# Patient Record
Sex: Female | Born: 1970 | Race: White | Hispanic: No | Marital: Single | Smoking: Current every day smoker
Health system: Southern US, Community
[De-identification: ages and names within clinical notes are randomized; demographics above are authoritative.]

## PROBLEM LIST (undated history)

## (undated) DIAGNOSIS — I10 Essential (primary) hypertension: Secondary | ICD-10-CM

## (undated) DIAGNOSIS — IMO0001 Reserved for inherently not codable concepts without codable children: Secondary | ICD-10-CM

## (undated) DIAGNOSIS — J069 Acute upper respiratory infection, unspecified: Secondary | ICD-10-CM

## (undated) DIAGNOSIS — M1990 Unspecified osteoarthritis, unspecified site: Secondary | ICD-10-CM

## (undated) DIAGNOSIS — E039 Hypothyroidism, unspecified: Secondary | ICD-10-CM

## (undated) DIAGNOSIS — L02219 Cutaneous abscess of trunk, unspecified: Principal | ICD-10-CM

## (undated) DIAGNOSIS — H269 Unspecified cataract: Secondary | ICD-10-CM

## (undated) DIAGNOSIS — E1142 Type 2 diabetes mellitus with diabetic polyneuropathy: Secondary | ICD-10-CM

## (undated) DIAGNOSIS — E119 Type 2 diabetes mellitus without complications: Secondary | ICD-10-CM

## (undated) DIAGNOSIS — L03319 Cellulitis of trunk, unspecified: Principal | ICD-10-CM

## (undated) DIAGNOSIS — K219 Gastro-esophageal reflux disease without esophagitis: Secondary | ICD-10-CM

## (undated) DIAGNOSIS — M79605 Pain in left leg: Secondary | ICD-10-CM

## (undated) DIAGNOSIS — L309 Dermatitis, unspecified: Secondary | ICD-10-CM

## (undated) DIAGNOSIS — D649 Anemia, unspecified: Secondary | ICD-10-CM

## (undated) DIAGNOSIS — M79604 Pain in right leg: Secondary | ICD-10-CM

## (undated) DIAGNOSIS — E785 Hyperlipidemia, unspecified: Secondary | ICD-10-CM

## (undated) HISTORY — DX: Pain in right leg: M79.604

## (undated) HISTORY — DX: Acute upper respiratory infection, unspecified: J06.9

## (undated) HISTORY — DX: Hypothyroidism, unspecified: E03.9

## (undated) HISTORY — PX: EYE SURGERY: SHX253

## (undated) HISTORY — DX: Type 2 diabetes mellitus with diabetic polyneuropathy: E11.42

## (undated) HISTORY — DX: Type 2 diabetes mellitus without complications: E11.9

## (undated) HISTORY — PX: WISDOM TOOTH EXTRACTION: SHX21

## (undated) HISTORY — DX: Reserved for inherently not codable concepts without codable children: IMO0001

## (undated) HISTORY — DX: Pain in left leg: M79.605

## (undated) HISTORY — DX: Dermatitis, unspecified: L30.9

## (undated) HISTORY — DX: Hyperlipidemia, unspecified: E78.5

## (undated) HISTORY — DX: Gastro-esophageal reflux disease without esophagitis: K21.9

## (undated) HISTORY — DX: Unspecified cataract: H26.9

## (undated) HISTORY — PX: CHOLECYSTECTOMY: SHX55

## (undated) HISTORY — DX: Essential (primary) hypertension: I10

---

## 2008-10-25 ENCOUNTER — Emergency Department (HOSPITAL_BASED_OUTPATIENT_CLINIC_OR_DEPARTMENT_OTHER): Admission: EM | Admit: 2008-10-25 | Discharge: 2008-10-25 | Payer: Self-pay | Admitting: Emergency Medicine

## 2009-01-10 ENCOUNTER — Emergency Department (HOSPITAL_BASED_OUTPATIENT_CLINIC_OR_DEPARTMENT_OTHER): Admission: EM | Admit: 2009-01-10 | Discharge: 2009-01-10 | Payer: Self-pay | Admitting: Emergency Medicine

## 2009-06-07 ENCOUNTER — Ambulatory Visit: Payer: Self-pay | Admitting: Diagnostic Radiology

## 2009-06-07 ENCOUNTER — Emergency Department (HOSPITAL_BASED_OUTPATIENT_CLINIC_OR_DEPARTMENT_OTHER): Admission: EM | Admit: 2009-06-07 | Discharge: 2009-06-07 | Payer: Self-pay | Admitting: Emergency Medicine

## 2009-10-08 DIAGNOSIS — M79605 Pain in left leg: Secondary | ICD-10-CM

## 2009-10-08 DIAGNOSIS — M79604 Pain in right leg: Secondary | ICD-10-CM

## 2009-10-08 HISTORY — DX: Pain in right leg: M79.605

## 2009-10-08 HISTORY — DX: Pain in right leg: M79.604

## 2010-06-24 LAB — CBC
MCHC: 33.5 g/dL (ref 30.0–36.0)
RBC: 4.91 MIL/uL (ref 3.87–5.11)
RDW: 13.1 % (ref 11.5–15.5)

## 2010-06-24 LAB — URINALYSIS, ROUTINE W REFLEX MICROSCOPIC
Bilirubin Urine: NEGATIVE
Glucose, UA: 100 mg/dL — AB
Nitrite: NEGATIVE
Urobilinogen, UA: 0.2 mg/dL (ref 0.0–1.0)

## 2010-06-24 LAB — BASIC METABOLIC PANEL
BUN: 8 mg/dL (ref 6–23)
Chloride: 104 mEq/L (ref 96–112)
GFR calc Af Amer: >60 mL/min (ref >60)
Glucose, Bld: 172 mg/dL — ABNORMAL HIGH (ref 70–99)
Sodium: 140 mEq/L (ref 135–145)

## 2010-06-24 LAB — DIFFERENTIAL
Eosinophils Absolute: 0 10*3/uL (ref 0.0–0.7)
Eosinophils Relative: 0 % (ref 0–5)
Lymphocytes Relative: 6 % — ABNORMAL LOW (ref 12–46)
Monocytes Absolute: 0.3 10*3/uL (ref 0.1–1.0)
Monocytes Relative: 5 % (ref 3–12)
Neutro Abs: 6 10*3/uL (ref 1.7–7.7)
Neutrophils Relative %: 88 % — ABNORMAL HIGH (ref 43–77)

## 2010-06-24 LAB — GLUCOSE, CAPILLARY: Glucose-Capillary: 172 mg/dL — ABNORMAL HIGH (ref 70–99)

## 2010-06-24 LAB — PREGNANCY, URINE: Preg Test, Ur: NEGATIVE

## 2010-06-27 LAB — LIPASE, BLOOD: Lipase: 81 U/L (ref 23–300)

## 2010-06-27 LAB — URINALYSIS, ROUTINE W REFLEX MICROSCOPIC
Hgb urine dipstick: NEGATIVE
Ketones, ur: NEGATIVE mg/dL
Nitrite: NEGATIVE
pH: 6 (ref 5.0–8.0)

## 2010-06-27 LAB — COMPREHENSIVE METABOLIC PANEL
ALT: 25 U/L (ref 0–35)
Alkaline Phosphatase: 81 U/L (ref 39–117)
BUN: 6 mg/dL (ref 6–23)
Calcium: 10.1 mg/dL (ref 8.4–10.5)
Chloride: 103 mEq/L (ref 96–112)
Creatinine, Ser: 0.8 mg/dL (ref 0.4–1.2)
GFR calc non Af Amer: >60 mL/min (ref >60)
Total Bilirubin: 0.3 mg/dL (ref 0.3–1.2)
Total Protein: 6.4 g/dL (ref 6.0–8.3)

## 2010-06-27 LAB — DIFFERENTIAL
Basophils Absolute: 0.1 10*3/uL (ref 0.0–0.1)
Basophils Relative: 1 % (ref 0–1)
Lymphs Abs: 2.3 10*3/uL (ref 0.7–4.0)
Neutro Abs: 5 10*3/uL (ref 1.7–7.7)

## 2010-06-27 LAB — CBC
HCT: 42 % (ref 36.0–46.0)
MCHC: 33.3 g/dL (ref 30.0–36.0)
MCV: 85.9 fL (ref 78.0–100.0)
Platelets: 267 10*3/uL (ref 150–400)
RBC: 4.89 MIL/uL (ref 3.87–5.11)
RDW: 13.5 % (ref 11.5–15.5)
WBC: 8.4 10*3/uL (ref 4.0–10.5)

## 2010-07-01 DIAGNOSIS — J069 Acute upper respiratory infection, unspecified: Secondary | ICD-10-CM

## 2010-07-01 HISTORY — DX: Acute upper respiratory infection, unspecified: J06.9

## 2010-08-12 ENCOUNTER — Encounter: Payer: Self-pay | Admitting: Nurse Practitioner

## 2011-08-10 ENCOUNTER — Encounter (HOSPITAL_BASED_OUTPATIENT_CLINIC_OR_DEPARTMENT_OTHER): Payer: Self-pay | Admitting: *Deleted

## 2011-08-10 ENCOUNTER — Emergency Department (HOSPITAL_BASED_OUTPATIENT_CLINIC_OR_DEPARTMENT_OTHER)
Admission: EM | Admit: 2011-08-10 | Discharge: 2011-08-10 | Disposition: A | Discharge status: Home / Self Care | Payer: Medicaid Other | Attending: Emergency Medicine | Admitting: Emergency Medicine

## 2011-08-10 DIAGNOSIS — E119 Type 2 diabetes mellitus without complications: Secondary | ICD-10-CM | POA: Insufficient documentation

## 2011-08-10 DIAGNOSIS — E785 Hyperlipidemia, unspecified: Secondary | ICD-10-CM | POA: Insufficient documentation

## 2011-08-10 DIAGNOSIS — K219 Gastro-esophageal reflux disease without esophagitis: Secondary | ICD-10-CM | POA: Insufficient documentation

## 2011-08-10 DIAGNOSIS — K529 Noninfective gastroenteritis and colitis, unspecified: Secondary | ICD-10-CM

## 2011-08-10 DIAGNOSIS — K5289 Other specified noninfective gastroenteritis and colitis: Secondary | ICD-10-CM | POA: Insufficient documentation

## 2011-08-10 DIAGNOSIS — Z79899 Other long term (current) drug therapy: Secondary | ICD-10-CM | POA: Insufficient documentation

## 2011-08-10 DIAGNOSIS — I10 Essential (primary) hypertension: Secondary | ICD-10-CM | POA: Insufficient documentation

## 2011-08-10 LAB — COMPREHENSIVE METABOLIC PANEL
AST: 26 U/L (ref 0–37)
Albumin: 3.6 g/dL (ref 3.5–5.2)
Alkaline Phosphatase: 54 U/L (ref 39–117)
Chloride: 100 mEq/L (ref 96–112)
Creatinine, Ser: 1.3 mg/dL — ABNORMAL HIGH (ref 0.50–1.10)
Potassium: 4.3 mEq/L (ref 3.5–5.1)
Sodium: 136 mEq/L (ref 135–145)
Total Bilirubin: 0.5 mg/dL (ref 0.3–1.2)

## 2011-08-10 LAB — CBC
HCT: 39.7 % (ref 36.0–46.0)
MCV: 75.3 fL — ABNORMAL LOW (ref 78.0–100.0)
RDW: 16.2 % — ABNORMAL HIGH (ref 11.5–15.5)
WBC: 12.6 10*3/uL — ABNORMAL HIGH (ref 4.0–10.5)

## 2011-08-10 LAB — DIFFERENTIAL
Basophils Absolute: 0 10*3/uL (ref 0.0–0.1)
Eosinophils Relative: 0 % (ref 0–5)
Lymphocytes Relative: 5 % — ABNORMAL LOW (ref 12–46)
Monocytes Absolute: 0.9 10*3/uL (ref 0.1–1.0)

## 2011-08-10 LAB — URINALYSIS, ROUTINE W REFLEX MICROSCOPIC
Glucose, UA: NEGATIVE mg/dL
Hgb urine dipstick: NEGATIVE
Ketones, ur: 15 mg/dL — AB
pH: 5 (ref 5.0–8.0)

## 2011-08-10 LAB — URINE MICROSCOPIC-ADD ON

## 2011-08-10 MED ORDER — SODIUM CHLORIDE 0.9 % IV BOLUS (SEPSIS)
1000.0000 mL | Freq: Once | INTRAVENOUS | Status: AC
  Administered 2011-08-10: 1000 mL via INTRAVENOUS

## 2011-08-10 MED ORDER — ONDANSETRON HCL 4 MG/2ML IJ SOLN
4.0000 mg | Freq: Once | INTRAMUSCULAR | Status: AC
  Administered 2011-08-10: 4 mg via INTRAVENOUS
  Filled 2011-08-10: qty 2

## 2011-08-10 MED ORDER — IBUPROFEN 400 MG PO TABS
400.0000 mg | ORAL_TABLET | Freq: Once | ORAL | Status: AC
  Administered 2011-08-10: 400 mg via ORAL
  Filled 2011-08-10: qty 1

## 2011-08-10 MED ORDER — PANTOPRAZOLE SODIUM 40 MG IV SOLR
40.0000 mg | Freq: Once | INTRAVENOUS | Status: AC
  Administered 2011-08-10: 40 mg via INTRAVENOUS
  Filled 2011-08-10: qty 40

## 2011-08-10 NOTE — ED Notes (Signed)
Pt reports N/V/D and epigastric abdominal pain since 5pm last night. Low grade fever at home.

## 2011-08-10 NOTE — ED Notes (Signed)
Pt sipping on water. States HA is slowly decreasing

## 2011-08-10 NOTE — ED Provider Notes (Signed)
History     CSN: [id number]  Arrival date & time 08/10/11  0346   First MD Initiated Contact with Patient 08/10/11 0400      Chief Complaint  Patient presents with  . Nausea, vomiting and diarrhea     (Consider location/radiation/quality/duration/timing/severity/associated sxs/prior treatment) HPI This is a 41 year old white female with about an a 11 hour history of nausea, vomiting and diarrhea. It began abruptly and was initially accompanied by a headache. It is been accompanied by a vaguely characterized epigastric pain and intermittent lightheadedness. She states she feels like she is dehydrated. She has a history of intermittent vomiting but states that this is more severe and she suspects it to be food poisoning or a virus. Vomiting is exacerbated by eating or drinking although she was able to drink some Gatorade this morning without vomiting. She had a subjective fever at home.  Past Medical History  Diagnosis Date  . DM (diabetes mellitus)   . Eczema   . Diabetic peripheral neuropathy   . URI (upper respiratory infection) 07/01/10  . NIDDM (non-insulin dependent diabetes mellitus)   . Hypertension   . GERD (gastroesophageal reflux disease)   . Hyperlipidemia   . Leg pain, bilateral 10/08/09    Past Surgical History  Procedure Date  . Cholecystectomy     No family history on file.  History  Substance Use Topics  . Smoking status: Never Smoker   . Smokeless tobacco: Not on file  . Alcohol Use: No    OB History    Grav Para Term Preterm Abortions TAB SAB Ect Mult Living                  Review of Systems  All other systems reviewed and are negative.    Allergies  Niaspan and Penicillins  Home Medications   Current Outpatient Rx  Name Route Sig Dispense Refill  . ASPIRIN 81 MG PO CHEW Oral Chew 81 mg by mouth daily.      . ATORVASTATIN CALCIUM 40 MG PO TABS Oral Take 40 mg by mouth daily.      Marland Kitchen PRODIGY AUTOCODE BLOOD GLUCOSE DEVI Does not apply by  Does not apply route 2 (two) times daily.      . CHOLINE FENOFIBRATE 135 MG PO CPDR Oral Take 135 mg by mouth daily.    Marland Kitchen CRANBERRY PO Oral Take by mouth 2 (two) times daily.      . OMEGA-3 FATTY ACIDS 1000 MG PO CAPS Oral Take 2 g by mouth daily.      Marland Kitchen GABAPENTIN 300 MG PO CAPS Oral Take 300 mg by mouth 3 (three) times daily.      Marland Kitchen GLIPIZIDE 10 MG PO TABS Oral Take 10 mg by mouth daily.      Marland Kitchen GLUCOSE BLOOD VI STRP Other 1 each by Other route 2 (two) times daily. Use as instructed     . LISINOPRIL 5 MG PO TABS Oral Take 5 mg by mouth daily.      Marland Kitchen METFORMIN HCL 1000 MG PO TABS Oral Take 1,000 mg by mouth 2 (two) times daily with a meal.      . PRODIGY LANCETS 21G MISC Does not apply by Does not apply route 2 (two) times daily.      Marland Kitchen B-6 PO Oral Take by mouth daily.      Marland Kitchen SAXAGLIPTIN HCL 2.5 MG PO TABS Oral Take 5 mg by mouth daily.    . WEIGHT LOSS DAILY MULTI  PO Oral Take by mouth. Patient is doing ([facility]) Weight loss system .     Marland Kitchen CHROMIUM PICOLINATE PO Oral Take by mouth daily.      . ALCOHOL SWABS PADS Does not apply by Does not apply route 2 (two) times daily.      . ERGOCALCIFEROL 50000 UNITS PO CAPS Oral Take 50,000 Units by mouth once a week.      . FENOFIBRATE 160 MG PO TABS Oral Take 160 mg by mouth daily.        BP 115/65  Pulse 118  Temp(Src) 99.1 F (37.3 C) (Oral)  Resp 18  Ht 5' (1.524 m)  Wt 234 lb (106.142 kg)  BMI 45.70 kg/m2  SpO2 100%  LMP 07/27/2011  Physical Exam General: Well-developed,  Morbidly obese female in no acute distress; appearance consistent with age of record HENT: normocephalic, atraumatic; pupils membranes moist Eyes: pupils equal round and reactive to light; extraocular muscles intact Neck: supple Heart: regular rate and rhythm; tachycardic Lungs: clear to auscultation bilaterally Abdomen: soft; obese; mild epigastric tenderness; bowel sounds present Extremities: No deformity; full range of motion Neurologic: Awake, alert and  oriented; motor function intact in all extremities and symmetric; no facial droop Skin: Warm and dry     ED Course  Procedures (including critical care time)     MDM   Nursing notes and vitals signs, including pulse oximetry, reviewed.  Summary of this visit's results, reviewed by myself:  Labs:  Results for orders placed during the hospital encounter of 08/10/11  COMPREHENSIVE METABOLIC PANEL      Component Value Range   Sodium 136  135 - 145 (mEq/L)   Potassium 4.3  3.5 - 5.1 (mEq/L)   Chloride 100  96 - 112 (mEq/L)   CO2 27  19 - 32 (mEq/L)   Glucose, Bld 129 (*) 70 - 99 (mg/dL)   BUN 14  6 - 23 (mg/dL)   Creatinine, Ser 1.61 (*) 0.50 - 1.10 (mg/dL)   Calcium 09.6  8.4 - 10.5 (mg/dL)   Total Protein 6.8  6.0 - 8.3 (g/dL)   Albumin 3.6  3.5 - 5.2 (g/dL)   AST 26  0 - 37 (U/L)   ALT 25  0 - 35 (U/L)   Alkaline Phosphatase 54  39 - 117 (U/L)   Total Bilirubin 0.5  0.3 - 1.2 (mg/dL)   GFR calc non Af Amer 51 (*) >90 (mL/min)   GFR calc Af Amer 59 (*) >90 (mL/min)  LIPASE, BLOOD      Component Value Range   Lipase 54  11 - 59 (U/L)  CBC      Component Value Range   WBC 12.6 (*) 4.0 - 10.5 (K/uL)   RBC 5.27 (*) 3.87 - 5.11 (MIL/uL)   Hemoglobin 13.3  12.0 - 15.0 (g/dL)   HCT 04.5  40.9 - 81.1 (%)   MCV 75.3 (*) 78.0 - 100.0 (fL)   MCH 25.2 (*) 26.0 - 34.0 (pg)   MCHC 33.5  30.0 - 36.0 (g/dL)   RDW 91.4 (*) 78.2 - 15.5 (%)   Platelets 289  150 - 400 (K/uL)  DIFFERENTIAL      Component Value Range   Neutrophils Relative 88 (*) 43 - 77 (%)   Neutro Abs 11.0 (*) 1.7 - 7.7 (K/uL)   Lymphocytes Relative 5 (*) 12 - 46 (%)   Lymphs Abs 0.6 (*) 0.7 - 4.0 (K/uL)   Monocytes Relative 7  3 - 12 (%)  Monocytes Absolute 0.9  0.1 - 1.0 (K/uL)   Eosinophils Relative 0  0 - 5 (%)   Eosinophils Absolute 0.0  0.0 - 0.7 (K/uL)   Basophils Relative 0  0 - 1 (%)   Basophils Absolute 0.0  0.0 - 0.1 (K/uL)  URINALYSIS, ROUTINE W REFLEX MICROSCOPIC      Component Value Range    Color, Urine ORANGE (*) YELLOW    APPearance TURBID (*) CLEAR    Specific Gravity, Urine 1.028  1.005 - 1.030    pH 5.0  5.0 - 8.0    Glucose, UA NEGATIVE  NEGATIVE (mg/dL)   Hgb urine dipstick NEGATIVE  NEGATIVE    Bilirubin Urine SMALL (*) NEGATIVE    Ketones, ur 15 (*) NEGATIVE (mg/dL)   Protein, ur 119 (*) NEGATIVE (mg/dL)   Urobilinogen, UA 0.2  0.0 - 1.0 (mg/dL)   Nitrite NEGATIVE  NEGATIVE    Leukocytes, UA SMALL (*) NEGATIVE   PREGNANCY, URINE      Component Value Range   Preg Test, Ur NEGATIVE  NEGATIVE   URINE MICROSCOPIC-ADD ON      Component Value Range   Squamous Epithelial / LPF MANY (*) RARE    WBC, UA 3-6  <3 (WBC/hpf)   RBC / HPF 0-2  <3 (RBC/hpf)   Bacteria, UA MANY (*) RARE    Casts HYALINE CASTS (*) NEGATIVE    Crystals CA OXALATE CRYSTALS (*) NEGATIVE    6:04 AM Feeling better. No longer nauseated. Drinking fluids without emesis. Given 2 L of normal saline IV.             Hanley Seamen, MD 08/10/11 [phone]

## 2011-08-10 NOTE — ED Notes (Signed)
Pt now states that she has had these symptoms intermittently for the past year. Has been tested for "bacteria in my gut" among other things. Currently being treated by Dr Modesto Charon.

## 2011-08-10 NOTE — ED Notes (Signed)
Pt ambulated around room without difficulty. Denies dizziness or lightheadedness. States HA and Abd pain have started to subside rating 5/10. Nausea improved. In agreement with plan to DC home.

## 2011-08-13 ENCOUNTER — Encounter (HOSPITAL_BASED_OUTPATIENT_CLINIC_OR_DEPARTMENT_OTHER): Payer: Self-pay | Admitting: *Deleted

## 2011-08-13 ENCOUNTER — Emergency Department (HOSPITAL_BASED_OUTPATIENT_CLINIC_OR_DEPARTMENT_OTHER)
Admission: EM | Admit: 2011-08-13 | Discharge: 2011-08-13 | Disposition: A | Discharge status: Home / Self Care | Payer: Medicaid Other | Attending: Emergency Medicine | Admitting: Emergency Medicine

## 2011-08-13 DIAGNOSIS — E785 Hyperlipidemia, unspecified: Secondary | ICD-10-CM | POA: Insufficient documentation

## 2011-08-13 DIAGNOSIS — Z7982 Long term (current) use of aspirin: Secondary | ICD-10-CM | POA: Insufficient documentation

## 2011-08-13 DIAGNOSIS — L259 Unspecified contact dermatitis, unspecified cause: Secondary | ICD-10-CM | POA: Insufficient documentation

## 2011-08-13 DIAGNOSIS — Z79899 Other long term (current) drug therapy: Secondary | ICD-10-CM | POA: Insufficient documentation

## 2011-08-13 DIAGNOSIS — I10 Essential (primary) hypertension: Secondary | ICD-10-CM | POA: Insufficient documentation

## 2011-08-13 DIAGNOSIS — K219 Gastro-esophageal reflux disease without esophagitis: Secondary | ICD-10-CM | POA: Insufficient documentation

## 2011-08-13 DIAGNOSIS — N289 Disorder of kidney and ureter, unspecified: Secondary | ICD-10-CM | POA: Insufficient documentation

## 2011-08-13 DIAGNOSIS — R197 Diarrhea, unspecified: Secondary | ICD-10-CM

## 2011-08-13 DIAGNOSIS — E1142 Type 2 diabetes mellitus with diabetic polyneuropathy: Secondary | ICD-10-CM | POA: Insufficient documentation

## 2011-08-13 DIAGNOSIS — R112 Nausea with vomiting, unspecified: Secondary | ICD-10-CM | POA: Insufficient documentation

## 2011-08-13 DIAGNOSIS — E1149 Type 2 diabetes mellitus with other diabetic neurological complication: Secondary | ICD-10-CM | POA: Insufficient documentation

## 2011-08-13 LAB — DIFFERENTIAL
Basophils Absolute: 0 10*3/uL (ref 0.0–0.1)
Basophils Relative: 0 % (ref 0–1)
Eosinophils Absolute: 0.1 10*3/uL (ref 0.0–0.7)
Neutro Abs: 4.4 10*3/uL (ref 1.7–7.7)
Neutrophils Relative %: 62 % (ref 43–77)

## 2011-08-13 LAB — URINE MICROSCOPIC-ADD ON

## 2011-08-13 LAB — COMPREHENSIVE METABOLIC PANEL
AST: 29 U/L (ref 0–37)
Albumin: 2.7 g/dL — ABNORMAL LOW (ref 3.5–5.2)
Alkaline Phosphatase: 54 U/L (ref 39–117)
Chloride: 101 mEq/L (ref 96–112)
Creatinine, Ser: 3.5 mg/dL — ABNORMAL HIGH (ref 0.50–1.10)
Potassium: 3.7 mEq/L (ref 3.5–5.1)
Total Bilirubin: 0.2 mg/dL — ABNORMAL LOW (ref 0.3–1.2)
Total Protein: 6 g/dL (ref 6.0–8.3)

## 2011-08-13 LAB — LIPASE, BLOOD: Lipase: 26 U/L (ref 11–59)

## 2011-08-13 LAB — URINALYSIS, ROUTINE W REFLEX MICROSCOPIC
Glucose, UA: 100 mg/dL — AB
Protein, ur: 100 mg/dL — AB
pH: 5 (ref 5.0–8.0)

## 2011-08-13 LAB — CBC
MCHC: 34 g/dL (ref 30.0–36.0)
RDW: 15.9 % — ABNORMAL HIGH (ref 11.5–15.5)

## 2011-08-13 MED ORDER — SODIUM CHLORIDE 0.9 % IV SOLN
Freq: Once | INTRAVENOUS | Status: AC
  Administered 2011-08-13: 21:00:00 via INTRAVENOUS

## 2011-08-13 MED ORDER — PANTOPRAZOLE SODIUM 20 MG PO TBEC: 20.0000 mg | DELAYED_RELEASE_TABLET | Freq: Every day | ORAL | Status: DC

## 2011-08-13 MED ORDER — ONDANSETRON HCL 4 MG/2ML IJ SOLN
4.0000 mg | Freq: Once | INTRAMUSCULAR | Status: AC
  Administered 2011-08-13: 4 mg via INTRAVENOUS
  Filled 2011-08-13: qty 2

## 2011-08-13 MED ORDER — GI COCKTAIL ~~LOC~~
30.0000 mL | Freq: Once | ORAL | Status: AC
  Administered 2011-08-13: 30 mL via ORAL
  Filled 2011-08-13: qty 30

## 2011-08-13 MED ORDER — PANTOPRAZOLE SODIUM 40 MG IV SOLR
40.0000 mg | Freq: Once | INTRAVENOUS | Status: AC
  Administered 2011-08-13: 40 mg via INTRAVENOUS
  Filled 2011-08-13: qty 40

## 2011-08-13 NOTE — ED Provider Notes (Signed)
Medical screening examination/treatment/procedure(s) were performed by non-physician practitioner and as supervising physician I was immediately available for consultation/collaboration.  Ethelda Chick, MD 08/13/11 [phone]

## 2011-08-13 NOTE — ED Notes (Signed)
Pt seen here this week on wed for evaluation of abd pain- states sx worse and here for re-evaluation- reports diarrhea but vomiting has resolved

## 2011-08-13 NOTE — Discharge Instructions (Signed)
You have an abnormal kidney function. The level is called a creatinine. Your level was abnormally high at 3.5. This is significantly higher from 08/10/2011 when the level was 1.3    Abdominal Pain Abdominal pain can be caused by many things. Your caregiver decides the seriousness of your pain by an examination and possibly blood tests and X-rays. Many cases can be observed and treated at home. Most abdominal pain is not caused by a disease and will probably improve without treatment. However, in many cases, more time must pass before a clear cause of the pain can be found. Before that point, it may not be known if you need more testing, or if hospitalization or surgery is needed. HOME CARE INSTRUCTIONS   Do not take laxatives unless directed by your caregiver.   Take pain medicine only as directed by your caregiver.   Only take over-the-counter or prescription medicines for pain, discomfort, or fever as directed by your caregiver.   Try a clear liquid diet (broth, tea, or water) for as long as directed by your caregiver. Slowly move to a bland diet as tolerated.  SEEK IMMEDIATE MEDICAL CARE IF:   The pain does not go away.   You have a fever.   You keep throwing up (vomiting).   The pain is felt only in portions of the abdomen. Pain in the right side could possibly be appendicitis. In an adult, pain in the left lower portion of the abdomen could be colitis or diverticulitis.   You pass bloody or black tarry stools.  MAKE SURE YOU:   Understand these instructions.   Will watch your condition.   Will get help right away if you are not doing well or get worse.  Document Released: 12/15/2004 Document Revised: 02/24/2011 Document Reviewed: 10/24/2007 [facility] Patient Information 2012 [place], [place].   End Stage Kidney Disease End-stage kidney disease occurs when your kidneys no longer work well enough to support day-to-day life. It usually occurs when longstanding (chronic) kidney  failure gets worse, to the point where kidney function is less than 10% of normal. At this point, the kidney function is so low that death will occur from buildup of fluids and waste products in the body. The most common cause of kidney failure is diabetes. Kidney failure is very common. ESRD (End Stage Renal Disease) almost always follows chronic kidney failure or renal insufficiency. This condition may exist for 10 to 20 years or more before developing into ESRD. SYMPTOMS   Unintentional weight loss.   Fatigue, anemia.   Generalized itching (pruritus).   Easy bruising or bleeding.   Drowsiness, lethargy.   Muscle twitching or cramps.   Skin may appear yellow or brown.   General ill feeling.   Frequent hiccups.   No or decreased urine output.   Decreased alertness.   Coma.   Increased skin pigmentation.   Decreased sensation in the hands, feet, or other areas.  DIAGNOSIS  Your caregiver will be able to tell what is wrong by talking to you, doing an examination, and doing laboratory tests. The blood work and urinalysis will show that your kidneys are not working well enough. TREATMENT  Dialysis or kidney transplantation are the only treatments for ESRD. Your health, age and other factors determine which treatment is best. Other treatments for chronic renal failure should continue. Associated diseases that cause kidney failure should be controlled. Some of these are:  Hypertension.   Kidney stones.   Heart failure.   Obstructions of the  urinary tract.   Urinary tract infections.   Glomerulonephritis.  PROGNOSIS  ESRD is fatal unless treated with dialysis or transplantation. Both of these treatments can have serious risks and consequences. The outcome varies and is unique to each individual. RISKS AND COMPLICATIONS Complications of dialysis and kidney transplantation:  Hypertension (kidneys try to raise blood pressure so they can work better).   Platelet  dysfunction (cells in blood which help with clotting are defective).   Gastrointestinal loss of blood, duodenal or peptic ulcers.   Hemorrhage (bleeding problems).   Anemia (not enough blood cells are produced).   Hepatitis B, hepatitis C, liver failure (exposure may occur during dialysis).   Infection from decreased operation of white blood cells and immune system.   Multiple cancers form, from long-term immunosuppressant use.   Peripheral neuropathy (damage to your nerves).   Seizures (convulsions).   Encephalopathy, nervous system damage, dementia (changes in your brain).   Weakening of the bones, fractures, joint disorders, joint replacements are common.   Permanent skin pigmentation changes.   Skin dryness, itching, scratching resulting in skin infection from hydration problems.   Changes in glucose metabolism.   Changes in electrolyte levels (salts in your blood).   Decreased libido, impotence (loss of interest in sex or ability to function well).   Miscarriage, menstrual irregularities, infertility.   Pericarditis (inflammation of the lining surrounding the heart).   Cardiac tamponade (fluid collection around the heart).   Heart failure in which your heart cannot pump well enough to keep up with the work.  PREVENTION  Treatment of the causes of longstanding kidney failure may delay or prevent progression to ESRD. For example, diabetes which is under strict control is less likely to cause renal failure than diabetes which is left untreated. Some causes of renal failure cannot be treated. Document Released: 05/28/2003 Document Revised: 02/24/2011 Document Reviewed: 03/07/2005 [facility] Patient Information 2012 [place], [place].

## 2011-08-13 NOTE — ED Provider Notes (Addendum)
History     CSN: [id number]  Arrival date & time 08/13/11  1904   First MD Initiated Contact with Patient 08/13/11 1950     8:53 PM HPI Patient reports 4 days ago began having n/v/d and abdominal pain. Reports symptoms have been worsening. States persistent diarrhea. Stated vomiting has resolved. Describes abdominal pain as diffuse. Denies fever, back pain, hematochezia, hematemesis, dysuria, or hematuria. Patient is a 41 y.o. female presenting with abdominal pain. The history is provided by the patient.  Abdominal Pain The primary symptoms of the illness include abdominal pain, nausea and diarrhea. The primary symptoms of the illness do not include fever, shortness of breath, vomiting, hematochezia, dysuria or vaginal discharge. The current episode started more than 2 days ago. The onset of the illness was gradual. The problem has been gradually worsening.  The patient states that she believes she is currently not pregnant. Symptoms associated with the illness do not include chills, constipation, urgency, hematuria, frequency or back pain.    Past Medical History  Diagnosis Date  . DM (diabetes mellitus)   . Eczema   . Diabetic peripheral neuropathy   . URI (upper respiratory infection) 07/01/10  . NIDDM (non-insulin dependent diabetes mellitus)   . Hypertension   . GERD (gastroesophageal reflux disease)   . Hyperlipidemia   . Leg pain, bilateral 10/08/09    Past Surgical History  Procedure Date  . Cholecystectomy   . Wisdom tooth extraction     No family history on file.  History  Substance Use Topics  . Smoking status: Never Smoker   . Smokeless tobacco: Never Used  . Alcohol Use: No    OB History    Grav Para Term Preterm Abortions TAB SAB Ect Mult Living                  Review of Systems  Constitutional: Negative for fever and chills.  Respiratory: Negative for shortness of breath.   Cardiovascular: Negative for chest pain.  Gastrointestinal: Positive for  nausea, abdominal pain and diarrhea. Negative for vomiting, constipation and hematochezia.  Genitourinary: Negative for dysuria, urgency, frequency, hematuria, flank pain, vaginal discharge and vaginal pain.  Musculoskeletal: Negative for back pain.  All other systems reviewed and are negative.    Allergies  Niaspan and Penicillins  Home Medications   Current Outpatient Rx  Name Route Sig Dispense Refill  . ACETAMINOPHEN 500 MG PO TABS Oral Take 2,000 mg by mouth every 6 (six) hours as needed. Patient used 2000 milligrams of this medication for pain.    Marland Kitchen ALCOHOL SWABS PADS Does not apply by Does not apply route 2 (two) times daily.      . ASPIRIN 81 MG PO CHEW Oral Chew 81 mg by mouth daily.      . ATORVASTATIN CALCIUM 40 MG PO TABS Oral Take 40 mg by mouth daily.      Marland Kitchen PRODIGY AUTOCODE BLOOD GLUCOSE DEVI Does not apply by Does not apply route 2 (two) times daily.      . CHOLINE FENOFIBRATE 135 MG PO CPDR Oral Take 135 mg by mouth daily.    . ERGOCALCIFEROL 50000 UNITS PO CAPS Oral Take 50,000 Units by mouth once a week.      . OMEGA-3 FATTY ACIDS 1000 MG PO CAPS Oral Take 2 g by mouth daily.      Marland Kitchen GABAPENTIN 300 MG PO CAPS Oral Take 300 mg by mouth 3 (three) times daily.      Marland Kitchen GLIPIZIDE  10 MG PO TABS Oral Take 10 mg by mouth daily.      Marland Kitchen GLUCOSE BLOOD VI STRP Other 1 each by Other route 2 (two) times daily. Use as instructed     . LISINOPRIL 5 MG PO TABS Oral Take 5 mg by mouth daily.      Marland Kitchen METFORMIN HCL 1000 MG PO TABS Oral Take 1,000 mg by mouth 2 (two) times daily with a meal.      . PRODIGY LANCETS 21G MISC Does not apply by Does not apply route 2 (two) times daily.      Marland Kitchen B-6 PO Oral Take by mouth daily.      Marland Kitchen SAXAGLIPTIN HCL 2.5 MG PO TABS Oral Take 5 mg by mouth daily.    . WEIGHT LOSS DAILY MULTI PO Oral Take by mouth. Patient is doing ([facility]) Weight loss system .     Marland Kitchen CHROMIUM PICOLINATE PO Oral Take by mouth daily.        BP 92/52  Pulse 97  Temp(Src) 98.3 F  (36.8 C) (Oral)  Resp 20  Ht 5' (1.524 m)  Wt 229 lb (103.874 kg)  BMI 44.72 kg/m2  SpO2 100%  LMP 07/27/2011  Physical Exam  Vitals reviewed. Constitutional: She is oriented to person, place, and time. Vital signs are normal. She appears well-developed and well-nourished.  HENT:  Head: Normocephalic and atraumatic.  Eyes: Conjunctivae are normal. Pupils are equal, round, and reactive to light.  Neck: Normal range of motion. Neck supple.  Cardiovascular: Normal rate, regular rhythm and normal heart sounds.  Exam reveals no friction rub.   No murmur heard. Pulmonary/Chest: Effort normal and breath sounds normal. She has no wheezes. She has no rhonchi. She has no rales. She exhibits no tenderness.  Abdominal: Soft. Bowel sounds are normal. She exhibits no distension and no mass. There is no hepatosplenomegaly. There is no tenderness. There is no rebound, no guarding and no CVA tenderness.       Morbid obesity  Musculoskeletal: Normal range of motion.  Neurological: She is alert and oriented to person, place, and time. Coordination normal.  Skin: Skin is warm and dry. No rash noted. No erythema. No pallor.    ED Course  Procedures   Results for orders placed during the hospital encounter of 08/13/11  CBC      Component Value Range   WBC 7.1  4.0 - 10.5 (K/uL)   RBC 4.46  3.87 - 5.11 (MIL/uL)   Hemoglobin 11.2 (*) 12.0 - 15.0 (g/dL)   HCT 40.9 (*) 81.1 - 46.0 (%)   MCV 73.8 (*) 78.0 - 100.0 (fL)   MCH 25.1 (*) 26.0 - 34.0 (pg)   MCHC 34.0  30.0 - 36.0 (g/dL)   RDW 91.4 (*) 78.2 - 15.5 (%)   Platelets 321  150 - 400 (K/uL)  DIFFERENTIAL      Component Value Range   Neutrophils Relative 62  43 - 77 (%)   Neutro Abs 4.4  1.7 - 7.7 (K/uL)   Lymphocytes Relative 23  12 - 46 (%)   Lymphs Abs 1.6  0.7 - 4.0 (K/uL)   Monocytes Relative 14 (*) 3 - 12 (%)   Monocytes Absolute 1.0  0.1 - 1.0 (K/uL)   Eosinophils Relative 1  0 - 5 (%)   Eosinophils Absolute 0.1  0.0 - 0.7 (K/uL)    Basophils Relative 0  0 - 1 (%)   Basophils Absolute 0.0  0.0 - 0.1 (K/uL)  COMPREHENSIVE METABOLIC PANEL      Component Value Range   Sodium 135  135 - 145 (mEq/L)   Potassium 3.7  3.5 - 5.1 (mEq/L)   Chloride 101  96 - 112 (mEq/L)   CO2 23  19 - 32 (mEq/L)   Glucose, Bld 121 (*) 70 - 99 (mg/dL)   BUN 23  6 - 23 (mg/dL)   Creatinine, Ser 1.61 (*) 0.50 - 1.10 (mg/dL)   Calcium 8.3 (*) 8.4 - 10.5 (mg/dL)   Total Protein 6.0  6.0 - 8.3 (g/dL)   Albumin 2.7 (*) 3.5 - 5.2 (g/dL)   AST 29  0 - 37 (U/L)   ALT 32  0 - 35 (U/L)   Alkaline Phosphatase 54  39 - 117 (U/L)   Total Bilirubin 0.2 (*) 0.3 - 1.2 (mg/dL)   GFR calc non Af Amer 15 (*) >90 (mL/min)   GFR calc Af Amer 18 (*) >90 (mL/min)  URINALYSIS, ROUTINE W REFLEX MICROSCOPIC      Component Value Range   Color, Urine AMBER (*) YELLOW    APPearance CLOUDY (*) CLEAR    Specific Gravity, Urine 1.025  1.005 - 1.030    pH 5.0  5.0 - 8.0    Glucose, UA 100 (*) NEGATIVE (mg/dL)   Hgb urine dipstick TRACE (*) NEGATIVE    Bilirubin Urine SMALL (*) NEGATIVE    Ketones, ur 15 (*) NEGATIVE (mg/dL)   Protein, ur 096 (*) NEGATIVE (mg/dL)   Urobilinogen, UA 0.2  0.0 - 1.0 (mg/dL)   Nitrite NEGATIVE  NEGATIVE    Leukocytes, UA SMALL (*) NEGATIVE   LIPASE, BLOOD      Component Value Range   Lipase 26  11 - 59 (U/L)  PREGNANCY, URINE      Component Value Range   Preg Test, Ur NEGATIVE  NEGATIVE   URINE MICROSCOPIC-ADD ON      Component Value Range   Squamous Epithelial / LPF RARE  RARE    WBC, UA 3-6  <3 (WBC/hpf)   RBC / HPF 0-2  <3 (RBC/hpf)   Bacteria, UA MANY (*) RARE    Casts HYALINE CASTS (*) NEGATIVE    Urine-Other MICROSCOPIC EXAM PERFORMED ON UNCONCENTRATED URINE     No results found.   MDM  10:35 PM Advised patient that we wanted to have patient admitted for renal insufficiency. Creatinine has has increased since the 22nd and is now 3.5. Patient however despite hearing benefits of admission does not want to be admitted.  Requests to leave AMA. States she will wait for primary care Dr. to have stool cultures returned. Advised patient return to [facility] should symptoms worsen. Patient has been informed that leaving AGAINST MEDICAL ADVICE could result in worsening renal insufficiency, renal failure, death. Patient voices understanding and is ready for discharge    Thomasene Lot, PA-C 08/13/11 [id number]  Thomasene Lot, PA-C 09/21/11 1031

## 2011-09-23 NOTE — ED Provider Notes (Signed)
Medical screening examination/treatment/procedure(s) were performed by non-physician practitioner and as supervising physician I was immediately available for consultation/collaboration.  Ethelda Chick, MD 09/23/11 [phone]

## 2012-06-20 ENCOUNTER — Other Ambulatory Visit: Payer: Self-pay | Admitting: Family Medicine

## 2012-08-17 ENCOUNTER — Encounter: Payer: Self-pay | Admitting: Nurse Practitioner

## 2012-08-17 ENCOUNTER — Ambulatory Visit (INDEPENDENT_AMBULATORY_CARE_PROVIDER_SITE_OTHER): Payer: Medicaid Other | Admitting: Nurse Practitioner

## 2012-08-17 VITALS — BP 109/71 | HR 80 | Temp 97.9°F | Ht 59.0 in | Wt 260.0 lb

## 2012-08-17 DIAGNOSIS — E1165 Type 2 diabetes mellitus with hyperglycemia: Secondary | ICD-10-CM

## 2012-08-17 DIAGNOSIS — E785 Hyperlipidemia, unspecified: Secondary | ICD-10-CM | POA: Insufficient documentation

## 2012-08-17 DIAGNOSIS — E1169 Type 2 diabetes mellitus with other specified complication: Secondary | ICD-10-CM | POA: Insufficient documentation

## 2012-08-17 DIAGNOSIS — E1149 Type 2 diabetes mellitus with other diabetic neurological complication: Secondary | ICD-10-CM

## 2012-08-17 DIAGNOSIS — E1122 Type 2 diabetes mellitus with diabetic chronic kidney disease: Secondary | ICD-10-CM | POA: Insufficient documentation

## 2012-08-17 DIAGNOSIS — I10 Essential (primary) hypertension: Secondary | ICD-10-CM

## 2012-08-17 DIAGNOSIS — E1142 Type 2 diabetes mellitus with diabetic polyneuropathy: Secondary | ICD-10-CM

## 2012-08-17 DIAGNOSIS — E119 Type 2 diabetes mellitus without complications: Secondary | ICD-10-CM

## 2012-08-17 DIAGNOSIS — E1140 Type 2 diabetes mellitus with diabetic neuropathy, unspecified: Secondary | ICD-10-CM

## 2012-08-17 DIAGNOSIS — E118 Type 2 diabetes mellitus with unspecified complications: Secondary | ICD-10-CM

## 2012-08-17 LAB — COMPLETE METABOLIC PANEL WITH GFR
Albumin: 3.8 g/dL (ref 3.5–5.2)
BUN: 19 mg/dL (ref 6–23)
CO2: 25 mEq/L (ref 19–32)
Calcium: 8.7 mg/dL (ref 8.4–10.5)
Chloride: 109 mEq/L (ref 96–112)
Creat: 1.13 mg/dL — ABNORMAL HIGH (ref 0.50–1.10)
GFR, Est African American: 70 mL/min
GFR, Est Non African American: 61 mL/min
Glucose, Bld: 117 mg/dL — ABNORMAL HIGH (ref 70–99)
Potassium: 5.6 mEq/L — ABNORMAL HIGH (ref 3.5–5.3)

## 2012-08-17 MED ORDER — LISINOPRIL 5 MG PO TABS: 5.0000 mg | ORAL_TABLET | Freq: Every day | ORAL | Status: DC

## 2012-08-17 MED ORDER — METFORMIN HCL 1000 MG PO TABS: 1000.0000 mg | ORAL_TABLET | Freq: Two times a day (BID) | ORAL | Status: DC

## 2012-08-17 NOTE — Progress Notes (Signed)
Subjective:    Patient ID: Diane Morgan, female    DOB: September 09, 1970, 42 y.o.   MRN: [id number]  Hypertension This is a chronic problem. The current episode started more than 1 year ago. The problem has been resolved since onset. The problem is controlled. Pertinent negatives include no blurred vision, chest pain, headaches, palpitations, peripheral edema or shortness of breath. There are no associated agents to hypertension. Risk factors for coronary artery disease include diabetes mellitus, dyslipidemia, obesity and sedentary lifestyle. Past treatments include ACE inhibitors. The current treatment provides significant improvement. Compliance problems include diet and exercise.   Hyperlipidemia This is a chronic problem. The current episode started more than 1 year ago. The problem is controlled. Recent lipid tests were reviewed and are normal. Exacerbating diseases include diabetes and obesity. There are no known factors aggravating her hyperlipidemia. Pertinent negatives include no chest pain or shortness of breath. Current antihyperlipidemic treatment includes statins and fibric acid derivatives. The current treatment provides significant improvement of lipids. Compliance problems include adherence to exercise and adherence to diet.  Risk factors for coronary artery disease include diabetes mellitus, hypertension and obesity.  Diabetes She presents for her follow-up diabetic visit. She has type 2 diabetes mellitus. No MedicAlert identification noted. The initial diagnosis of diabetes was made 15 years ago. Her disease course has been fluctuating. There are no hypoglycemic associated symptoms. Pertinent negatives for hypoglycemia include no headaches. Pertinent negatives for diabetes include no blurred vision, no chest pain, no polydipsia, no polyphagia and no polyuria. There are no hypoglycemic complications. Symptoms are stable. Risk factors for coronary artery disease include dyslipidemia, hypertension  and obesity. Current diabetic treatment includes oral agent (triple therapy). She is compliant with treatment most of the time. Her weight is increasing steadily. When asked about meal planning, she reported none. She has not had a previous visit with a dietician. She never participates in exercise. There is no change in her home blood glucose trend. Her breakfast blood glucose is taken between 8-9 am. Her breakfast blood glucose range is generally 90-110 mg/dl. Her overall blood glucose range is 90-110 mg/dl. An ACE inhibitor/angiotensin II receptor blocker is being taken. She does not see a podiatrist.Eye exam is current (March 2014).  GERD Omeprazole daily- Keeps symptoms under control Diabetic neuropathy Neurotin helps- pain is mainly in legs bil    Review of Systems  Eyes: Negative for blurred vision.  Respiratory: Negative for shortness of breath.   Cardiovascular: Negative for chest pain and palpitations.  Endocrine: Negative for polydipsia, polyphagia and polyuria.  Neurological: Negative for headaches.  All other systems reviewed and are negative.       Objective:   Physical Exam  Constitutional: She is oriented to person, place, and time. She appears well-developed and well-nourished.  HENT:  Nose: Nose normal.  Mouth/Throat: Oropharynx is clear and moist.  Eyes: EOM are normal.  Neck: Trachea normal, normal range of motion and full passive range of motion without pain. Neck supple. No JVD present. Carotid bruit is not present. No thyromegaly present.  Cardiovascular: Normal rate, regular rhythm, normal heart sounds and intact distal pulses.  Exam reveals no gallop and no friction rub.   No murmur heard. Pulmonary/Chest: Effort normal and breath sounds normal.  Abdominal: Soft. Bowel sounds are normal. She exhibits no distension and no mass. There is no tenderness.  Musculoskeletal: Normal range of motion.  Lymphadenopathy:    She has no cervical adenopathy.  Neurological:  She is alert and oriented to person, place,  and time. She has normal reflexes.  Skin: Skin is warm and dry.  Psychiatric: She has a normal mood and affect. Her behavior is normal. Judgment and thought content normal.  BP 109/71  Pulse 80  Temp(Src) 97.9 F (36.6 C) (Oral)  Ht 4\' 11"  (1.499 m)  Wt 260 lb (117.935 kg)  BMI 52.49 kg/m2 See diabetic foot exam documentation Results for orders placed in visit on 08/17/12  POCT GLYCOSYLATED HEMOGLOBIN (HGB A1C)      Result Value Range   Hemoglobin A1C 6.8%             Assessment & Plan:   1. HTN (hypertension)   2. Diabetes   3. Other and unspecified hyperlipidemia   4. Hypertension   5. Hyperlipidemia   6. Morbid obesity   7. Type II or unspecified type diabetes mellitus with unspecified complication, uncontrolled   8. Diabetic neuropathy, type II diabetes mellitus    Orders Placed This Encounter  Procedures  . COMPLETE METABOLIC PANEL WITH GFR  . NMR Lipoprofile with Lipids  . POCT glycosylated hemoglobin (Hb A1C)     Medication List       These changes are accurate as of: 08/17/2012 11:03 AM. If you have any questions, ask your nurse or doctor.          STOP taking these medications       B-6 PO  Stopped by:  Bennie Pierini, FNP     ergocalciferol 50000 UNITS capsule  Commonly known as:  VITAMIN D2  Stopped by:  Bennie Pierini, FNP     pantoprazole 20 MG tablet  Commonly known as:  PROTONIX  Stopped by:  Bennie Pierini, FNP      TAKE these medications       acetaminophen 500 MG tablet  Commonly known as:  TYLENOL  Take 2,000 mg by mouth every 6 (six) hours as needed. Patient used 2000 milligrams of this medication for pain.     Alcohol Swabs Pads  by Does not apply route 2 (two) times daily.     aspirin 81 MG chewable tablet  Chew 81 mg by mouth daily.     atorvastatin 40 MG tablet  Commonly known as:  LIPITOR  TAKE ONE TABLET BY MOUTH EVERY DAY     Choline Fenofibrate 135 MG  capsule  Take 135 mg by mouth daily.     CHROMIUM PICOLINATE PO  Take by mouth daily.     fish oil-omega-3 fatty acids 1000 MG capsule  Take 2 g by mouth daily.     gabapentin 300 MG capsule  Commonly known as:  NEURONTIN  Take 300 mg by mouth 3 (three) times daily.     glipiZIDE 10 MG tablet  Commonly known as:  GLUCOTROL  Take 10 mg by mouth daily.     glucose blood test strip  1 each by Other route 2 (two) times daily. Use as instructed     lisinopril 5 MG tablet  Commonly known as:  PRINIVIL,ZESTRIL  Take 5 mg by mouth daily.     metFORMIN 1000 MG tablet  Commonly known as:  GLUCOPHAGE  Take 1,000 mg by mouth 2 (two) times daily with a meal.     omeprazole 40 MG capsule  Commonly known as:  PRILOSEC  Take 40 mg by mouth daily.     ONGLYZA 5 MG Tabs tablet  Generic drug:  saxagliptin HCl  Take 5 mg by mouth daily.     pioglitazone 30  MG tablet  Commonly known as:  ACTOS  Take 30 mg by mouth daily.     Prodigy Autocode Blood Glucose Devi  by Does not apply route 2 (two) times daily.     PRODIGY LANCETS 21G Misc  by Does not apply route 2 (two) times daily.     pyridoxine 200 MG tablet  Commonly known as:  B-6  Take 200 mg by mouth daily.     Vitamin D3 2000 UNITS Tabs  Take by mouth.     WEIGHT LOSS DAILY MULTI PO  Take by mouth. Patient is doing ([facility]) Weight loss system .       Continue all meds Labs pending Continue diabetic diet Exercise encouraged Health maintenance reviewed- encouraged to make appointment for PAP  Mary-Margaret Daphine Deutscher, FNP

## 2012-08-17 NOTE — Patient Instructions (Signed)

## 2012-08-21 LAB — NMR LIPOPROFILE WITH LIPIDS
LDL (calc): 27 mg/dL (ref <100)
LDL Particle Number: 642 nmol/L (ref <1000)
LDL Size: 19.8 nm — ABNORMAL LOW (ref >20.5)
LP-IR Score: 54 — ABNORMAL HIGH (ref <=45)
Large VLDL-P: 1.3 nmol/L (ref <=2.7)
Small LDL Particle Number: 496 nmol/L (ref <=527)
VLDL Size: 44.2 nm (ref <=46.6)

## 2012-10-18 ENCOUNTER — Other Ambulatory Visit: Payer: Self-pay | Admitting: *Deleted

## 2012-10-18 MED ORDER — GLIPIZIDE 10 MG PO TABS: 10.0000 mg | ORAL_TABLET | Freq: Every day | ORAL | Status: DC

## 2012-10-18 MED ORDER — CHOLINE FENOFIBRATE 135 MG PO CPDR: 135.0000 mg | DELAYED_RELEASE_CAPSULE | Freq: Every day | ORAL | Status: DC

## 2012-10-19 ENCOUNTER — Encounter: Payer: Self-pay | Admitting: Family Medicine

## 2012-10-19 ENCOUNTER — Ambulatory Visit (INDEPENDENT_AMBULATORY_CARE_PROVIDER_SITE_OTHER): Payer: Medicaid Other | Admitting: Family Medicine

## 2012-10-19 VITALS — BP 118/66 | HR 74 | Temp 96.9°F | Wt 268.8 lb

## 2012-10-19 DIAGNOSIS — L03011 Cellulitis of right finger: Secondary | ICD-10-CM

## 2012-10-19 DIAGNOSIS — IMO0002 Reserved for concepts with insufficient information to code with codable children: Secondary | ICD-10-CM

## 2012-10-19 MED ORDER — SULFAMETHOXAZOLE-TMP DS 800-160 MG PO TABS: 1.0000 | ORAL_TABLET | Freq: Two times a day (BID) | ORAL | Status: DC

## 2012-10-23 ENCOUNTER — Telehealth: Payer: Self-pay | Admitting: Family Medicine

## 2012-10-24 NOTE — Telephone Encounter (Signed)
Please advise 

## 2012-10-25 ENCOUNTER — Other Ambulatory Visit: Payer: Self-pay | Admitting: Family Medicine

## 2012-10-25 DIAGNOSIS — IMO0002 Reserved for concepts with insufficient information to code with codable children: Secondary | ICD-10-CM

## 2012-10-25 MED ORDER — DOXYCYCLINE HYCLATE 100 MG PO TABS: 100.0000 mg | ORAL_TABLET | Freq: Two times a day (BID) | ORAL | Status: DC

## 2012-10-25 NOTE — Telephone Encounter (Signed)
DC bactrim DS and start doxycycline 100mg  po bid and sent to pharmacy.

## 2012-11-12 NOTE — Patient Instructions (Signed)
Cellulitis Cellulitis is an infection of the skin and the tissue beneath it. The infected area is usually red and tender. Cellulitis occurs most often in the arms and lower legs.  CAUSES  Cellulitis is caused by bacteria that enter the skin through cracks or cuts in the skin. The most common types of bacteria that cause cellulitis are Staphylococcus and Streptococcus. SYMPTOMS   Redness and warmth.  Swelling.  Tenderness or pain.  Fever. DIAGNOSIS  Your caregiver can usually determine what is wrong based on a physical exam. Blood tests may also be done. TREATMENT  Treatment usually involves taking an antibiotic medicine. HOME CARE INSTRUCTIONS   Take your antibiotics as directed. Finish them even if you start to feel better.  Keep the infected arm or leg elevated to reduce swelling.  Apply a warm cloth to the affected area up to 4 times per day to relieve pain.  Only take over-the-counter or prescription medicines for pain, discomfort, or fever as directed by your caregiver.  Keep all follow-up appointments as directed by your caregiver. SEEK MEDICAL CARE IF:   You notice red streaks coming from the infected area.  Your red area gets larger or turns dark in color.  Your bone or joint underneath the infected area becomes painful after the skin has healed.  Your infection returns in the same area or another area.  You notice a swollen bump in the infected area.  You develop new symptoms. SEEK IMMEDIATE MEDICAL CARE IF:   You have a fever.  You feel very sleepy.  You develop vomiting or diarrhea.  You have a general ill feeling (malaise) with muscle aches and pains. MAKE SURE YOU:   Understand these instructions.  Will watch your condition.  Will get help right away if you are not doing well or get worse. Document Released: 12/15/2004 Document Revised: 09/06/2011 Document Reviewed: 05/23/2011 [employer] Patient Information 2014 [employer].  

## 2012-11-12 NOTE — Progress Notes (Signed)
  Subjective:    Patient ID: Marcene Laskowski, female    DOB: [birth year], 42 y.o.   MRN: [id number]  HPI This 42 y.o. female presents for evaluation of infected right index finger.   Review of Systems No chest pain, SOB, HA, dizziness, vision change, N/V, diarrhea, constipation, dysuria, urinary urgency or frequency, myalgias, arthralgias or rash.     Objective:   Physical Exam Vital signs noted  Well developed well nourished female.  HEENT - Head atraumatic Normocephalic                Eyes - PERRLA, Conjuctiva - clear Sclera- Clear EOMI                Ears - EAC's Wnl TM's Wnl Gross Hearing WNL                Nose - Nares patent                 Throat - oropharanx wnl Respiratory - Lungs CTA bilateral Cardiac - RRR S1 and S2 without murmur Skin - Right index finger with paronchia       Assessment & Plan:  Paronychia, right - Plan: DISCONTINUED: sulfamethoxazole-trimethoprim (BACTRIM DS) 800-160 MG per tablet

## 2012-11-14 ENCOUNTER — Other Ambulatory Visit: Payer: Self-pay

## 2012-11-14 MED ORDER — GABAPENTIN 300 MG PO CAPS: 300.0000 mg | ORAL_CAPSULE | Freq: Three times a day (TID) | ORAL | Status: DC

## 2012-11-14 MED ORDER — OMEPRAZOLE 40 MG PO CPDR: 40.0000 mg | DELAYED_RELEASE_CAPSULE | Freq: Every day | ORAL | Status: DC

## 2012-11-24 ENCOUNTER — Ambulatory Visit (INDEPENDENT_AMBULATORY_CARE_PROVIDER_SITE_OTHER): Payer: Medicaid Other | Admitting: General Practice

## 2012-11-24 ENCOUNTER — Encounter: Payer: Self-pay | Admitting: General Practice

## 2012-11-24 VITALS — BP 97/63 | HR 105 | Temp 97.5°F | Wt 274.0 lb

## 2012-11-24 DIAGNOSIS — L02219 Cutaneous abscess of trunk, unspecified: Secondary | ICD-10-CM

## 2012-11-24 MED ORDER — DOXYCYCLINE HYCLATE 100 MG PO TABS: 100.0000 mg | ORAL_TABLET | Freq: Two times a day (BID) | ORAL | Status: DC

## 2012-11-24 MED ORDER — SULFAMETHOXAZOLE-TMP DS 800-160 MG PO TABS: 1.0000 | ORAL_TABLET | Freq: Two times a day (BID) | ORAL | Status: DC

## 2012-11-24 NOTE — Progress Notes (Signed)
  Subjective:    Patient ID: Diane Morgan, female    DOB: 1970/08/10, 42 y.o.   MRN: [id number]  HPI Patient presents today with complaints of an area to left abdomen that has become red, tender,and drainage. The onset was reported to be Tuesday and gradually worsened. She reports it began as a little red bug bite and progressed. Reports cleaning the area with safegauard soap and water, applied neosporin three times daily. Reports area has hardened gradually since Thursday.  She reports currently being on her menses.    Review of Systems  Constitutional: Negative for fever and chills.  Respiratory: Negative for chest tightness and shortness of breath.   Cardiovascular: Negative for chest pain and palpitations.  Genitourinary: Negative for dysuria and difficulty urinating.  Skin:       Redness to left abdomen  Neurological: Negative for dizziness, weakness and headaches.       Objective:   Physical Exam  Constitutional: She is oriented to person, place, and time. She appears well-developed and well-nourished.  Cardiovascular: Normal rate, regular rhythm and normal heart sounds.   Pulmonary/Chest: Effort normal and breath sounds normal. No respiratory distress. She exhibits no tenderness.  Neurological: She is alert and oriented to person, place, and time.  Skin: Skin is warm and dry. There is erythema.  Abscessed area (size of pencil eraser), noted to left lower abdomen, with erythema and firmness noted 2 inches x 2inches surrounding. Small amount of blood tinged, yellowish drainage noted. Unable to express  Psychiatric: She has a normal mood and affect.          Assessment & Plan:  1. Cellulitis and abscess of trunk - sulfamethoxazole-trimethoprim (BACTRIM DS) 800-160 MG per tablet; Take 1 tablet by mouth 2 (two) times daily.  Dispense: 20 tablet; Refill: 0 - doxycycline (VIBRA-TABS) 100 MG tablet; Take 1 tablet (100 mg total) by mouth 2 (two) times daily.  Dispense: 20 tablet;  Refill: 0 -keep area clean -RTO on Tuesday for follow up, if symptoms worsen seek emergency medical treatment -take medications as prescribed -Patient verbalized understanding -Coralie Keens, FNP-C

## 2012-11-24 NOTE — Patient Instructions (Signed)
Cellulitis Cellulitis is an infection of the skin and the tissue beneath it. The infected area is usually red and tender. Cellulitis occurs most often in the arms and lower legs.  CAUSES  Cellulitis is caused by bacteria that enter the skin through cracks or cuts in the skin. The most common types of bacteria that cause cellulitis are Staphylococcus and Streptococcus. SYMPTOMS   Redness and warmth.  Swelling.  Tenderness or pain.  Fever. DIAGNOSIS  Your caregiver can usually determine what is wrong based on a physical exam. Blood tests may also be done. TREATMENT  Treatment usually involves taking an antibiotic medicine. HOME CARE INSTRUCTIONS   Take your antibiotics as directed. Finish them even if you start to feel better.  Keep the infected arm or leg elevated to reduce swelling.  Apply a warm cloth to the affected area up to 4 times per day to relieve pain.  Only take over-the-counter or prescription medicines for pain, discomfort, or fever as directed by your caregiver.  Keep all follow-up appointments as directed by your caregiver. SEEK MEDICAL CARE IF:   You notice red streaks coming from the infected area.  Your red area gets larger or turns dark in color.  Your bone or joint underneath the infected area becomes painful after the skin has healed.  Your infection returns in the same area or another area.  You notice a swollen bump in the infected area.  You develop new symptoms. SEEK IMMEDIATE MEDICAL CARE IF:   You have a fever.  You feel very sleepy.  You develop vomiting or diarrhea.  You have a general ill feeling (malaise) with muscle aches and pains. MAKE SURE YOU:   Understand these instructions.  Will watch your condition.  Will get help right away if you are not doing well or get worse. Document Released: 12/15/2004 Document Revised: 09/06/2011 Document Reviewed: 05/23/2011 [employer] Patient Information 2014 [employer].  

## 2012-11-26 ENCOUNTER — Telehealth: Payer: Self-pay | Admitting: Family Medicine

## 2012-11-26 NOTE — Telephone Encounter (Signed)
Appt given for tomorrow am

## 2012-11-27 ENCOUNTER — Ambulatory Visit (INDEPENDENT_AMBULATORY_CARE_PROVIDER_SITE_OTHER): Payer: Medicaid Other | Admitting: Family Medicine

## 2012-11-27 ENCOUNTER — Encounter: Payer: Self-pay | Admitting: Family Medicine

## 2012-11-27 VITALS — BP 101/62 | HR 104 | Temp 97.2°F | Ht 59.0 in | Wt 283.0 lb

## 2012-11-27 DIAGNOSIS — E785 Hyperlipidemia, unspecified: Secondary | ICD-10-CM

## 2012-11-27 DIAGNOSIS — E119 Type 2 diabetes mellitus without complications: Secondary | ICD-10-CM

## 2012-11-27 DIAGNOSIS — L0390 Cellulitis, unspecified: Secondary | ICD-10-CM

## 2012-11-27 DIAGNOSIS — L0291 Cutaneous abscess, unspecified: Secondary | ICD-10-CM

## 2012-11-27 DIAGNOSIS — I10 Essential (primary) hypertension: Secondary | ICD-10-CM

## 2012-11-27 LAB — POCT CBC
Granulocyte percent: 86.5 %G — AB (ref 37–80)
HCT, POC: 27.3 % — AB (ref 37.7–47.9)
Hemoglobin: 93.1 g/dL — AB (ref 12.2–16.2)
Lymph, poc: 1 (ref 0.6–3.4)
MCH, POC: 24.3 pg — AB (ref 27–31.2)
MCHC: 33.5 g/dL (ref 31.8–35.4)
MCV: 72.4 fL — AB (ref 80–97)
MPV: 8.5 fL (ref 0–99.8)
POC Granulocyte: 14 — AB (ref 2–6.9)
POC LYMPH PERCENT: 6.2 %L — AB (ref 10–50)
Platelet Count, POC: 379 10*3/uL (ref 142–424)
RBC: 3.8 M/uL — AB (ref 4.04–5.48)
RDW, POC: 16.9 %
WBC: 16.2 10*3/uL — AB (ref 4.6–10.2)

## 2012-11-27 LAB — POCT GLYCOSYLATED HEMOGLOBIN (HGB A1C): Hemoglobin A1C: 6

## 2012-11-27 LAB — POCT UA - MICROALBUMIN: Microalbumin Ur, POC: NEGATIVE mg/L

## 2012-11-27 MED ORDER — CLINDAMYCIN HCL 300 MG PO CAPS: 300.0000 mg | ORAL_CAPSULE | Freq: Three times a day (TID) | ORAL | Status: DC

## 2012-11-27 NOTE — Progress Notes (Signed)
  Subjective:    Patient ID: Diane Morgan, female    DOB: 1971-02-04, 42 y.o.   MRN: [id number]  HPI This 42 y.o. female presents for evaluation of cellulitis of wound LLQ of abdomen. She has been having some problems tolerating the abx's and stopped them. She has hx of being allergic to PCN.  She has hx o diabetes, HTN, and hyperlipidemia. She has hx of GERD and has been controlled with omeprazole.     Review of Systems C/o cellulitis No chest pain, SOB, HA, dizziness, vision change, N/V, diarrhea, constipation, dysuria, urinary urgency or frequency, myalgias, arthralgias or rash.     Objective:   Physical Exam Vital signs noted  Well developed well nourished female.  HEENT - Head atraumatic Normocephalic                Eyes - PERRLA, Conjuctiva - clear Sclera- Clear EOMI Respiratory - Lungs CTA bilateral Cardiac - RRR S1 and S2 without murmur GI - Abdomen soft Nontender and bowel sounds active x 4 Extremities - No edema. Neuro - Grossly intact. Skin - LLQ of abdomen with dressing with drainage and 5cm diameter region of induration And erythema with small incision from Incision and drainage in middle.      Assessment & Plan:  Cellulitis - Plan: clindamycin (CLEOCIN) 300 MG capsule  Diabetes - Plan: POCT CBC, POCT glycosylated hemoglobin (Hb A1C), CMP14+EGFR, POCT UA - Microalbumin, Lipid panel, Thyroid Panel With TSH  Essential hypertension, benign - Plan: POCT CBC, CMP14+EGFR, POCT UA - Microalbumin, Lipid panel, Thyroid Panel With TSH  Other and unspecified hyperlipidemia - Plan: POCT CBC, CMP14+EGFR, POCT UA - Microalbumin, Lipid panel, Thyroid Panel With TSH  Follow up in one week for wound check.

## 2012-11-28 ENCOUNTER — Inpatient Hospital Stay (HOSPITAL_COMMUNITY): Payer: Medicaid Other

## 2012-11-28 ENCOUNTER — Inpatient Hospital Stay (HOSPITAL_COMMUNITY)
Admission: AD | Admit: 2012-11-28 | Discharge: 2012-12-02 | DRG: 603 | Disposition: A | Discharge status: Home / Self Care | Payer: Medicaid Other | Source: Ambulatory Visit | Attending: Internal Medicine | Admitting: Internal Medicine

## 2012-11-28 ENCOUNTER — Encounter (HOSPITAL_COMMUNITY): Payer: Self-pay | Admitting: General Practice

## 2012-11-28 ENCOUNTER — Ambulatory Visit: Payer: Medicaid Other | Admitting: Family Medicine

## 2012-11-28 ENCOUNTER — Encounter: Payer: Self-pay | Admitting: Family Medicine

## 2012-11-28 ENCOUNTER — Ambulatory Visit (INDEPENDENT_AMBULATORY_CARE_PROVIDER_SITE_OTHER): Payer: Medicaid Other | Admitting: Family Medicine

## 2012-11-28 VITALS — BP 125/69 | HR 108 | Temp 96.7°F | Ht 59.0 in | Wt 283.0 lb

## 2012-11-28 DIAGNOSIS — Z8249 Family history of ischemic heart disease and other diseases of the circulatory system: Secondary | ICD-10-CM

## 2012-11-28 DIAGNOSIS — E1140 Type 2 diabetes mellitus with diabetic neuropathy, unspecified: Secondary | ICD-10-CM

## 2012-11-28 DIAGNOSIS — L0390 Cellulitis, unspecified: Secondary | ICD-10-CM

## 2012-11-28 DIAGNOSIS — N179 Acute kidney failure, unspecified: Secondary | ICD-10-CM | POA: Diagnosis present

## 2012-11-28 DIAGNOSIS — E1165 Type 2 diabetes mellitus with hyperglycemia: Secondary | ICD-10-CM

## 2012-11-28 DIAGNOSIS — L0291 Cutaneous abscess, unspecified: Secondary | ICD-10-CM

## 2012-11-28 DIAGNOSIS — Z6841 Body Mass Index (BMI) 40.0 and over, adult: Secondary | ICD-10-CM

## 2012-11-28 DIAGNOSIS — E1169 Type 2 diabetes mellitus with other specified complication: Secondary | ICD-10-CM | POA: Diagnosis present

## 2012-11-28 DIAGNOSIS — E860 Dehydration: Secondary | ICD-10-CM | POA: Diagnosis present

## 2012-11-28 DIAGNOSIS — E1122 Type 2 diabetes mellitus with diabetic chronic kidney disease: Secondary | ICD-10-CM | POA: Diagnosis present

## 2012-11-28 DIAGNOSIS — M793 Panniculitis, unspecified: Secondary | ICD-10-CM | POA: Diagnosis present

## 2012-11-28 DIAGNOSIS — E1142 Type 2 diabetes mellitus with diabetic polyneuropathy: Secondary | ICD-10-CM | POA: Diagnosis present

## 2012-11-28 DIAGNOSIS — E1149 Type 2 diabetes mellitus with other diabetic neurological complication: Secondary | ICD-10-CM | POA: Diagnosis present

## 2012-11-28 DIAGNOSIS — L02219 Cutaneous abscess of trunk, unspecified: Principal | ICD-10-CM

## 2012-11-28 DIAGNOSIS — K219 Gastro-esophageal reflux disease without esophagitis: Secondary | ICD-10-CM

## 2012-11-28 DIAGNOSIS — I10 Essential (primary) hypertension: Secondary | ICD-10-CM | POA: Diagnosis present

## 2012-11-28 DIAGNOSIS — A4902 Methicillin resistant Staphylococcus aureus infection, unspecified site: Secondary | ICD-10-CM | POA: Diagnosis present

## 2012-11-28 DIAGNOSIS — Z833 Family history of diabetes mellitus: Secondary | ICD-10-CM

## 2012-11-28 DIAGNOSIS — L259 Unspecified contact dermatitis, unspecified cause: Secondary | ICD-10-CM | POA: Diagnosis present

## 2012-11-28 DIAGNOSIS — Z9089 Acquired absence of other organs: Secondary | ICD-10-CM

## 2012-11-28 DIAGNOSIS — E785 Hyperlipidemia, unspecified: Secondary | ICD-10-CM | POA: Diagnosis present

## 2012-11-28 DIAGNOSIS — Z23 Encounter for immunization: Secondary | ICD-10-CM

## 2012-11-28 HISTORY — DX: Cutaneous abscess of trunk, unspecified: L02.219

## 2012-11-28 HISTORY — DX: Cellulitis of trunk, unspecified: L03.319

## 2012-11-28 LAB — COMPREHENSIVE METABOLIC PANEL
Alkaline Phosphatase: 82 U/L (ref 39–117)
BUN: 29 mg/dL — ABNORMAL HIGH (ref 6–23)
Calcium: 8.5 mg/dL (ref 8.4–10.5)
GFR calc Af Amer: 43 mL/min — ABNORMAL LOW (ref >90)
Glucose, Bld: 119 mg/dL — ABNORMAL HIGH (ref 70–99)
Sodium: 133 mEq/L — ABNORMAL LOW (ref 135–145)
Total Protein: 5.9 g/dL — ABNORMAL LOW (ref 6.0–8.3)

## 2012-11-28 LAB — URINALYSIS, ROUTINE W REFLEX MICROSCOPIC
Glucose, UA: NEGATIVE mg/dL
Leukocytes, UA: NEGATIVE
Nitrite: NEGATIVE
Protein, ur: NEGATIVE mg/dL
Urobilinogen, UA: 0.2 mg/dL (ref 0.0–1.0)

## 2012-11-28 LAB — MRSA PCR SCREENING: MRSA by PCR: POSITIVE — AB

## 2012-11-28 LAB — CMP14+EGFR
ALT: 14 IU/L (ref 0–32)
AST: 18 IU/L (ref 0–40)
Albumin/Globulin Ratio: 1 — ABNORMAL LOW (ref 1.1–2.5)
Albumin: 2.4 g/dL — ABNORMAL LOW (ref 3.5–5.5)
Alkaline Phosphatase: 93 IU/L (ref 39–117)
BUN/Creatinine Ratio: 14 (ref 9–23)
BUN: 41 mg/dL — ABNORMAL HIGH (ref 6–24)
CO2: 17 mmol/L — ABNORMAL LOW (ref 18–29)
Calcium: 7.8 mg/dL — ABNORMAL LOW (ref 8.7–10.2)
Chloride: 90 mmol/L — ABNORMAL LOW (ref 97–108)
Creatinine, Ser: 2.88 mg/dL — ABNORMAL HIGH (ref 0.57–1.00)
GFR calc Af Amer: 23 mL/min/{1.73_m2} — ABNORMAL LOW (ref >59)
GFR calc non Af Amer: 20 mL/min/{1.73_m2} — ABNORMAL LOW (ref >59)
Globulin, Total: 2.4 g/dL (ref 1.5–4.5)
Glucose: 223 mg/dL — ABNORMAL HIGH (ref 65–99)
Potassium: 4.8 mmol/L (ref 3.5–5.2)
Sodium: 127 mmol/L — ABNORMAL LOW (ref 134–144)
Total Bilirubin: 0.2 mg/dL (ref 0.0–1.2)
Total Protein: 4.8 g/dL — ABNORMAL LOW (ref 6.0–8.5)

## 2012-11-28 LAB — CBC WITH DIFFERENTIAL/PLATELET
Basophils Absolute: 0 10*3/uL (ref 0.0–0.1)
Basophils Relative: 0 % (ref 0–1)
Hemoglobin: 9.5 g/dL — ABNORMAL LOW (ref 12.0–15.0)
MCHC: 33.6 g/dL (ref 30.0–36.0)
Monocytes Relative: 11 % (ref 3–12)
Neutro Abs: 6.8 10*3/uL (ref 1.7–7.7)
Neutrophils Relative %: 72 % (ref 43–77)

## 2012-11-28 LAB — MAGNESIUM: Magnesium: 1.3 mg/dL — ABNORMAL LOW (ref 1.5–2.5)

## 2012-11-28 LAB — THYROID PANEL WITH TSH
Free Thyroxine Index: 1.8 (ref 1.2–4.9)
T3 Uptake Ratio: 31 % (ref 24–39)
T4, Total: 5.7 ug/dL (ref 4.5–12.0)
TSH: 3.34 u[IU]/mL (ref 0.450–4.500)

## 2012-11-28 LAB — HEMOGLOBIN A1C: Hgb A1c MFr Bld: 6.5 % — ABNORMAL HIGH (ref <5.7)

## 2012-11-28 LAB — LIPID PANEL
Chol/HDL Ratio: 29.7 ratio units — ABNORMAL HIGH (ref 0.0–4.4)
Cholesterol, Total: 89 mg/dL — ABNORMAL LOW (ref 100–199)
HDL: 3 mg/dL — ABNORMAL LOW (ref >39)
LDL Calculated: 54 mg/dL (ref 0–99)
Triglycerides: 161 mg/dL — ABNORMAL HIGH (ref 0–149)
VLDL Cholesterol Cal: 32 mg/dL (ref 5–40)

## 2012-11-28 LAB — GLUCOSE, CAPILLARY
Glucose-Capillary: 139 mg/dL — ABNORMAL HIGH (ref 70–99)
Glucose-Capillary: 113 mg/dL — ABNORMAL HIGH (ref 70–99)
Glucose-Capillary: 139 mg/dL — ABNORMAL HIGH (ref 70–99)

## 2012-11-28 MED ORDER — MAGNESIUM SULFATE 40 MG/ML IJ SOLN
2.0000 g | Freq: Once | INTRAMUSCULAR | Status: AC
  Administered 2012-11-29: 2 g via INTRAVENOUS
  Filled 2012-11-28: qty 50

## 2012-11-28 MED ORDER — DEXTROSE 5 % IV SOLN
1.0000 g | Freq: Once | INTRAVENOUS | Status: AC
  Administered 2012-11-28: 1 g via INTRAVENOUS
  Filled 2012-11-28: qty 1

## 2012-11-28 MED ORDER — VANCOMYCIN HCL 10 G IV SOLR
2000.0000 mg | Freq: Once | INTRAVENOUS | Status: AC
  Administered 2012-11-28: 2000 mg via INTRAVENOUS
  Filled 2012-11-28: qty 2000

## 2012-11-28 MED ORDER — AZTREONAM 1 G IJ SOLR: 1.0000 g | Freq: Two times a day (BID) | INTRAMUSCULAR | Status: DC

## 2012-11-28 MED ORDER — INFLUENZA VAC SPLIT QUAD 0.5 ML IM SUSP
0.5000 mL | INTRAMUSCULAR | Status: AC
  Administered 2012-11-28: 0.5 mL via INTRAMUSCULAR
  Filled 2012-11-28: qty 0.5

## 2012-11-28 MED ORDER — ACETAMINOPHEN 650 MG RE SUPP: 650.0000 mg | Freq: Four times a day (QID) | RECTAL | Status: DC | PRN

## 2012-11-28 MED ORDER — SODIUM CHLORIDE 0.9 % IV SOLN
INTRAVENOUS | Status: DC
  Administered 2012-11-28 – 2012-11-30 (×6): via INTRAVENOUS

## 2012-11-28 MED ORDER — GABAPENTIN 300 MG PO CAPS
300.0000 mg | ORAL_CAPSULE | Freq: Three times a day (TID) | ORAL | Status: DC
  Administered 2012-11-28 – 2012-12-02 (×12): 300 mg via ORAL
  Filled 2012-11-28 (×15): qty 1

## 2012-11-28 MED ORDER — MUPIROCIN 2 % EX OINT
TOPICAL_OINTMENT | Freq: Two times a day (BID) | CUTANEOUS | Status: DC
  Administered 2012-11-28 – 2012-12-01 (×7): via NASAL
  Filled 2012-11-28 (×2): qty 22

## 2012-11-28 MED ORDER — ATORVASTATIN CALCIUM 20 MG PO TABS
20.0000 mg | ORAL_TABLET | Freq: Every day | ORAL | Status: DC
  Administered 2012-11-28 – 2012-12-01 (×4): 20 mg via ORAL
  Filled 2012-11-28 (×5): qty 1

## 2012-11-28 MED ORDER — MORPHINE SULFATE 2 MG/ML IJ SOLN: 2.0000 mg | INTRAMUSCULAR | Status: DC | PRN

## 2012-11-28 MED ORDER — ONDANSETRON HCL 4 MG/2ML IJ SOLN: 4.0000 mg | Freq: Four times a day (QID) | INTRAMUSCULAR | Status: DC | PRN

## 2012-11-28 MED ORDER — IOHEXOL 300 MG/ML  SOLN
25.0000 mL | INTRAMUSCULAR | Status: AC
  Administered 2012-11-28 (×2): 25 mL via ORAL

## 2012-11-28 MED ORDER — INSULIN ASPART 100 UNIT/ML ~~LOC~~ SOLN: 0.0000 [IU] | Freq: Every day | SUBCUTANEOUS | Status: DC

## 2012-11-28 MED ORDER — SODIUM CHLORIDE 0.9 % IV BOLUS (SEPSIS)
1000.0000 mL | Freq: Once | INTRAVENOUS | Status: AC
  Administered 2012-11-28: 1000 mL via INTRAVENOUS

## 2012-11-28 MED ORDER — HYDROCODONE-ACETAMINOPHEN 5-325 MG PO TABS: 1.0000 | ORAL_TABLET | ORAL | Status: DC | PRN

## 2012-11-28 MED ORDER — ALUM & MAG HYDROXIDE-SIMETH 200-200-20 MG/5ML PO SUSP: 30.0000 mL | Freq: Four times a day (QID) | ORAL | Status: DC | PRN

## 2012-11-28 MED ORDER — DOCUSATE SODIUM 100 MG PO CAPS
100.0000 mg | ORAL_CAPSULE | Freq: Two times a day (BID) | ORAL | Status: DC
  Administered 2012-11-28 – 2012-12-01 (×7): 100 mg via ORAL
  Filled 2012-11-28 (×8): qty 1

## 2012-11-28 MED ORDER — BISACODYL 5 MG PO TBEC: 5.0000 mg | DELAYED_RELEASE_TABLET | Freq: Every day | ORAL | Status: DC | PRN

## 2012-11-28 MED ORDER — MUPIROCIN 2 % EX OINT
TOPICAL_OINTMENT | CUTANEOUS | Status: AC
  Administered 2012-11-28: 17:00:00
  Filled 2012-11-28: qty 22

## 2012-11-28 MED ORDER — ASPIRIN 81 MG PO CHEW
81.0000 mg | CHEWABLE_TABLET | Freq: Every day | ORAL | Status: DC
  Administered 2012-11-28 – 2012-12-02 (×5): 81 mg via ORAL
  Filled 2012-11-28 (×5): qty 1

## 2012-11-28 MED ORDER — DEXTROSE 5 % IV SOLN
1.0000 g | Freq: Three times a day (TID) | INTRAVENOUS | Status: DC
  Administered 2012-11-28 – 2012-12-01 (×8): 1 g via INTRAVENOUS
  Filled 2012-11-28 (×11): qty 1

## 2012-11-28 MED ORDER — HEPARIN SODIUM (PORCINE) 5000 UNIT/ML IJ SOLN
5000.0000 [IU] | Freq: Three times a day (TID) | INTRAMUSCULAR | Status: DC
  Administered 2012-11-28 – 2012-12-02 (×12): 5000 [IU] via SUBCUTANEOUS
  Filled 2012-11-28 (×14): qty 1

## 2012-11-28 MED ORDER — ONDANSETRON HCL 4 MG PO TABS: 4.0000 mg | ORAL_TABLET | Freq: Four times a day (QID) | ORAL | Status: DC | PRN

## 2012-11-28 MED ORDER — INSULIN ASPART 100 UNIT/ML ~~LOC~~ SOLN
0.0000 [IU] | Freq: Three times a day (TID) | SUBCUTANEOUS | Status: DC
  Administered 2012-11-29: 3 [IU] via SUBCUTANEOUS
  Administered 2012-11-29: 7 [IU] via SUBCUTANEOUS
  Administered 2012-11-29 – 2012-11-30 (×3): 4 [IU] via SUBCUTANEOUS
  Administered 2012-11-30 – 2012-12-01 (×2): 3 [IU] via SUBCUTANEOUS
  Administered 2012-12-01 – 2012-12-02 (×2): 4 [IU] via SUBCUTANEOUS

## 2012-11-28 MED ORDER — VANCOMYCIN HCL 10 G IV SOLR
1500.0000 mg | INTRAVENOUS | Status: DC
  Filled 2012-11-28: qty 1500

## 2012-11-28 MED ORDER — ACETAMINOPHEN 325 MG PO TABS: 650.0000 mg | ORAL_TABLET | Freq: Four times a day (QID) | ORAL | Status: DC | PRN

## 2012-11-28 NOTE — Progress Notes (Signed)
ANTIBIOTIC CONSULT NOTE - INITIAL  Pharmacy Consult:  Vancomycin / Azactam Indication:  Abdominal wall cellulitis and abscess, likely MRSA  Allergies  Allergen Reactions  . Niaspan [Niacin] Other (See Comments)    Patient passes out.  Marland Kitchen Penicillins Other (See Comments)    Childhood Reaction.    Patient Measurements: Height: 5\' 2"  (157.5 cm) Weight: 256 lb 12.8 oz (116.484 kg) IBW/kg (Calculated) : 50.1  Vital Signs: Temp: 97.8 F (36.6 C) (09/10 1230) Temp src: Oral (09/10 1230) BP: 142/50 mmHg (09/10 1230) Pulse Rate: 103 (09/10 1230)  Labs:  Recent Labs  11/27/12 0931 11/27/12 0942  WBC  --  16.2*  HGB  --  93.1*  CREATININE 2.88*  --    Estimated Creatinine Clearance: 31.4 ml/min (by C-G formula based on Cr of 2.88). No results found for this basename: VANCOTROUGH, VANCOPEAK, VANCORANDOM, GENTTROUGH, GENTPEAK, GENTRANDOM, TOBRATROUGH, TOBRAPEAK, TOBRARND, AMIKACINPEAK, AMIKACINTROU, AMIKACIN,  in the last 72 hours   Microbiology: No results found for this or any previous visit (from the past 720 hour(s)).  Medical History: Past Medical History  Diagnosis Date  . DM (diabetes mellitus)   . Eczema   . Diabetic peripheral neuropathy   . URI (upper respiratory infection) 07/01/10  . NIDDM (non-insulin dependent diabetes mellitus)   . Hypertension   . GERD (gastroesophageal reflux disease)   . Hyperlipidemia   . Leg pain, bilateral 10/08/09  . Cellulitis and abscess of trunk 11/28/2012      Assessment 42 YOF with abdominal wall erythema directly admitted from her PCP's office.  Patient was previously treated with doxycycline and Septra without improvement.  Noted abscess was drained.  Pharmacy consulted to manage vancomycin and Azactam for abdominal wall cellulitis and abscess, concerning with MRSA infection.  Patient was admitted with acute renal dysfunction.   Goal of Therapy:  Vancomycin trough level 15-20 mcg/ml   Plan:  - Vanc 2gm IV x 1, then  1500mg  IV Q24H - Azactam 1gm IV Q8H - Monitor renal fxn, clinical course, vanc trough as indicated     Laney Louderback D. Laney Potash, PharmD, BCPS Pager:  [phone] 11/28/2012, 1:40 PM

## 2012-11-28 NOTE — Patient Instructions (Signed)
Cellulitis Cellulitis is an infection of the skin and the tissue beneath it. The infected area is usually red and tender. Cellulitis occurs most often in the arms and lower legs.  CAUSES  Cellulitis is caused by bacteria that enter the skin through cracks or cuts in the skin. The most common types of bacteria that cause cellulitis are Staphylococcus and Streptococcus. SYMPTOMS   Redness and warmth.  Swelling.  Tenderness or pain.  Fever. DIAGNOSIS  Your caregiver can usually determine what is wrong based on a physical exam. Blood tests may also be done. TREATMENT  Treatment usually involves taking an antibiotic medicine. HOME CARE INSTRUCTIONS   Take your antibiotics as directed. Finish them even if you start to feel better.  Keep the infected arm or leg elevated to reduce swelling.  Apply a warm cloth to the affected area up to 4 times per day to relieve pain.  Only take over-the-counter or prescription medicines for pain, discomfort, or fever as directed by your caregiver.  Keep all follow-up appointments as directed by your caregiver. SEEK MEDICAL CARE IF:   You notice red streaks coming from the infected area.  Your red area gets larger or turns dark in color.  Your bone or joint underneath the infected area becomes painful after the skin has healed.  Your infection returns in the same area or another area.  You notice a swollen bump in the infected area.  You develop new symptoms. SEEK IMMEDIATE MEDICAL CARE IF:   You have a fever.  You feel very sleepy.  You develop vomiting or diarrhea.  You have a general ill feeling (malaise) with muscle aches and pains. MAKE SURE YOU:   Understand these instructions.  Will watch your condition.  Will get help right away if you are not doing well or get worse. Document Released: 12/15/2004 Document Revised: 09/06/2011 Document Reviewed: 05/23/2011 [employer] Patient Information 2014 [employer].  

## 2012-11-28 NOTE — H&P (Signed)
[facility] History and Physical  Diane Morgan AOZ:[id number] DOB: Jun 14, 1970 DOA: 11/28/2012  Referring physician: Nils Pyle PCP: Rudi Heap, MD    Chief Complaint: Abdominal Wall Erythema  HPI: Diane Morgan is a 42 y.o. female with a past medical history of morbid obesity, type 2 diabetes mellitus, hypertension, dyslipidemia, who presents as a direct admission from her primary care physician's office for evaluation of cellulitis and abscess of abdominal wall. She reports noting a "bump" in her skin in the left lower quadrant abdominal region 2 weeks ago. Over time this became erythematous, having localized warmth, indurated and painful. Because of worsening symptoms she presented this past weekend 2 urgent care where she was diagnosed with an abdominal wall sebaceous cyst undergoing incision and drainage. She was instructed to followup with her primary care provider early this week. Meanwhile patient had been on doxycycline and Bactrim therapy. There is concerns about possible doxycycline allergy for which this was changed to monotherapy with clindamycin which he tolerated well, taking her first dose yesturday. Over the last several days she has reported severe localized pain along with extension of erythema and induration. She denies purulent discharge. She also complains of malaise, subjective fevers and chills and well as decreased urinary output, which has been dark and concentrated.  Patient reporting compliance to antimicrobial therapy. Lab work done at her primary care physician's office today showed a white count of 16,500 with a BUN of 40 and creatinine of 2.33. She was transferred to this facility for further evaluation and treatment.  Review of Systems: The patient denies anorexia, weight loss,, vision loss, decreased hearing, hoarseness, chest pain, syncope, dyspnea on exertion, peripheral edema, balance deficits, hemoptysis,  melena, hematochezia, severe  indigestion/heartburn, hematuria, incontinence, genital sores, muscle weakness, , transient blindness, difficulty walking, depression, unusual weight change, abnormal bleeding, enlarged lymph nodes, angioedema, and breast masses.    Past Medical History  Diagnosis Date  . DM (diabetes mellitus)   . Eczema   . Diabetic peripheral neuropathy   . URI (upper respiratory infection) 07/01/10  . NIDDM (non-insulin dependent diabetes mellitus)   . Hypertension   . GERD (gastroesophageal reflux disease)   . Hyperlipidemia   . Leg pain, bilateral 10/08/09   Past Surgical History  Procedure Laterality Date  . Cholecystectomy    . Wisdom tooth extraction     Social History:  reports that she has never smoked. She has never used smokeless tobacco. She reports that she does not drink alcohol or use illicit drugs. Patient resides locally, she is currently single has a significant other. She denies tobacco alcohol and illicit drug use. She works as a Social worker.   Allergies  Allergen Reactions  . Niaspan [Niacin] Other (See Comments)    Patient passes out.  Marland Kitchen Penicillins Other (See Comments)    Childhood Reaction.    Family History  Problem Relation Age of Onset  . Diabetes Father   . Diabetes Maternal Grandmother   . Heart disease Maternal Grandmother   . Diabetes Maternal Grandfather     Prior to Admission medications   Medication Sig Start Date End Date Taking? Authorizing Provider  aspirin 81 MG chewable tablet Chew 81 mg by mouth daily.      Historical Provider, MD  atorvastatin (LIPITOR) 40 MG tablet TAKE ONE TABLET BY MOUTH EVERY DAY 06/20/12   Horald Pollen, PA-C  gabapentin (NEURONTIN) 300 MG capsule Take 1 capsule (300 mg total) by mouth 3 (three) times daily. 11/14/12   Deatra Canter,  FNP   Physical Exam: Filed Vitals:   11/28/12 1230  BP: 142/50  Pulse: 103  Temp: 97.8 F (36.6 C)  Resp: 18     General:  Well-nourished well-developed female in no acute  distress.  Eyes: Pupils are equal round reactive to light extraocular movement intact no sclera icterus  ENT: Neck is supple symmetrical no jugular venous distention or carotid bruits  Cardiovascular: Distant heart sounds which I suspect secondary to body habitus. Regular rate and rhythm normal S1-S2 no murmurs or gallop  Respiratory: Lungs are clear to auscultation bilaterally no wheezing rhonchi rales  Abdomen: Obese with erythema over the left lower quadrant region, extending past midline medially. There is associated induration. 2 superficial lesions were noted as well, no fluctuant masses were palpated, I cannot express purulence.  Musculoskeletal: Present range of motion of all extremities  Psychiatric: Patient is awake alert oriented x3  Neurologic: Redness to 2- 12 grossly intact no alteration to sensation, global 5 of 5 muscle strength  Labs on Admission:  Basic Metabolic Panel:  Recent Labs Lab 11/27/12 0931  NA 127*  K 4.8  CL 90*  CO2 17*  GLUCOSE 223*  BUN 41*  CREATININE 2.88*  CALCIUM 7.8*   Liver Function Tests:  Recent Labs Lab 11/27/12 0931  AST 18  ALT 14  ALKPHOS 93  BILITOT 0.2  PROT 4.8*   No results found for this basename: LIPASE, AMYLASE,  in the last 168 hours No results found for this basename: AMMONIA,  in the last 168 hours CBC:  Recent Labs Lab 11/27/12 0942  WBC 16.2*  HGB 93.1*  HCT 27.3*  MCV 72.4*   Cardiac Enzymes: No results found for this basename: CKTOTAL, CKMB, CKMBINDEX, TROPONINI,  in the last 168 hours  BNP (last 3 results) No results found for this basename: PROBNP,  in the last 8760 hours CBG: No results found for this basename: GLUCAP,  in the last 168 hours  Radiological Exams on Admission: No results found.    Assessment/Plan Active Problems:   Hypertension   Hyperlipidemia   Type II or unspecified type diabetes mellitus with unspecified complication, uncontrolled   Cellulitis and abscess   GERD  (gastroesophageal reflux disease)  Abdominal wall cellulitis Patient reporting or edema and induration to region of left lower quadrant, significantly becoming worse in the last several days. She was treated initially with oral doxycycline and Bactrim therapy, later changed to clindamycin (forst dose on 11/27/12) given possibility of doxycycline tolerance. Despite oral antimicrobial therapy, her erythema and  induration has progressed, with lab work obtained today at her primary care physician's office showing an elevated white count of 16,500. She is also in acute renal failure. I will start vancomycin IV with pharmacy consultation as well as aztreonam for broad-spectrum empiric antibiotic therapy. Will obtain blood cultures and wound cultures, check a CT scan to assess for possible underlying abscess.   Acute renal failure Lab work obtained today at her primary care physician's office showed a BUN of 40 with a creatinine of 2.33. I suspect in part this may be related to prerenal acidemia hours she is on multiple potential nephrotoxic medications including lisinopril. Will provide IV fluid resuscitation with 1 liter of normal saline bolus followed by NS 150 ml/hour, monitor ins and outs. Pharmacy consultation for antibiotic dosing.  Hypertension Will discontinue ACE inhibitor therapy due to the development of acute renal failure. Start Norvasc 10 mg by mouth daily. She presents with a blood pressure 142/50.  Type 2 diabetes mellitus I will discontinue oral hypoglycemics given development of acute renal failure. Place patient on sliding scale coverage with Accu-Cheks q. a.c. qhs. Followup on hemoglobin A1c.   Diabetic Neuropathy Continue gabapentin therapy   Dyslipidemia Continue statin therapy  Nutrition Diabetic diet  DVT prophylaxis Heparin 5000 units subcutaneous 3 times a day  Code Status: Full code  Family Communication: Plan discussed with patient and family members present at bedside   Disposition Plan: I anticipate patient will require at least 2 night hospitalization  Time spent:70 minutes  Jeralyn Bennett [facility] Pager [phone]  If 7PM-7AM, please contact night-coverage [email or web address] Password [email or web address] 11/28/2012, 1:14 PM

## 2012-11-28 NOTE — Progress Notes (Signed)
  Subjective:    Patient ID: Diane Morgan, female    DOB: Oct 11, 1970, 42 y.o.   MRN: [id number]  HPI This 42 y.o. female presents for evaluation of abcess LLQ of her abdomen.  She was Seen over the weekend and had Incision and Drainage of LLQ abdominal sebaceous cyst. She was advised to go to the hospital but refused.  She was seen yesterday and her left abdominal Wound had more induration and pain.  She did labs and her labs show elevated BUN/ creatinine 40/2.33 and elevated wbc's 16.5.  She has been having fever and is feeling worse.   Review of Systems C/o LLQ abdominal wound No chest pain, SOB, HA, dizziness, vision change, N/V, diarrhea, constipation, dysuria, urinary urgency or frequency, myalgias, arthralgias or rash.     Objective:   Physical Exam  Vital signs noted  Well developed well nourished female.  HEENT - Head atraumatic Normocephalic                Eyes - PERRLA, Conjuctiva - clear Sclera- Clear EOMI                Ears - EAC's Wnl TM's Wnl Gross Hearing WNL                Nose - Nares patent                 Throat - oropharanx wnl Respiratory - Lungs CTA bilateral Cardiac - RRR S1 and S2 without murmur GI - Abdomen soft Nontender and bowel sounds active x 4 Extremities - No edema. Neuro - Grossly intact. Skin - LLQ abdomen with Erythema approx 8cm in diameter with induration and swelling Yellow area in middle where incision and drainage performed.  TTP left lower abdomen.     Assessment & Plan:  Cellulitis - Called and spoke with hospitalist at [facility] and patient is accepted on Med Surgical floor. Patient advised to go to [facility] via POV and her mother will take her.

## 2012-11-29 DIAGNOSIS — E785 Hyperlipidemia, unspecified: Secondary | ICD-10-CM

## 2012-11-29 DIAGNOSIS — E1142 Type 2 diabetes mellitus with diabetic polyneuropathy: Secondary | ICD-10-CM

## 2012-11-29 DIAGNOSIS — L0291 Cutaneous abscess, unspecified: Secondary | ICD-10-CM

## 2012-11-29 DIAGNOSIS — K219 Gastro-esophageal reflux disease without esophagitis: Secondary | ICD-10-CM

## 2012-11-29 DIAGNOSIS — E1149 Type 2 diabetes mellitus with other diabetic neurological complication: Secondary | ICD-10-CM

## 2012-11-29 DIAGNOSIS — I10 Essential (primary) hypertension: Secondary | ICD-10-CM

## 2012-11-29 LAB — CBC
HCT: 28.5 % — ABNORMAL LOW (ref 36.0–46.0)
Hemoglobin: 9.4 g/dL — ABNORMAL LOW (ref 12.0–15.0)
RDW: 17.6 % — ABNORMAL HIGH (ref 11.5–15.5)
WBC: 7.3 10*3/uL (ref 4.0–10.5)

## 2012-11-29 LAB — GLUCOSE, CAPILLARY
Glucose-Capillary: 150 mg/dL — ABNORMAL HIGH (ref 70–99)
Glucose-Capillary: 121 mg/dL — ABNORMAL HIGH (ref 70–99)

## 2012-11-29 LAB — LIPID PANEL
HDL: 7 mg/dL — ABNORMAL LOW (ref >39)
LDL Cholesterol: 26 mg/dL (ref 0–99)
Triglycerides: 196 mg/dL — ABNORMAL HIGH (ref <150)

## 2012-11-29 LAB — BASIC METABOLIC PANEL
Chloride: 107 mEq/L (ref 96–112)
Creatinine, Ser: 1.21 mg/dL — ABNORMAL HIGH (ref 0.50–1.10)
GFR calc Af Amer: 64 mL/min — ABNORMAL LOW (ref >90)
GFR calc non Af Amer: 55 mL/min — ABNORMAL LOW (ref >90)
Potassium: 4.6 mEq/L (ref 3.5–5.1)

## 2012-11-29 LAB — TSH: TSH: 2.602 u[IU]/mL (ref 0.350–4.500)

## 2012-11-29 LAB — MAGNESIUM: Magnesium: 1.2 mg/dL — ABNORMAL LOW (ref 1.5–2.5)

## 2012-11-29 MED ORDER — LINAGLIPTIN 5 MG PO TABS
5.0000 mg | ORAL_TABLET | Freq: Every day | ORAL | Status: DC
  Administered 2012-11-29 – 2012-12-02 (×4): 5 mg via ORAL
  Filled 2012-11-29 (×5): qty 1

## 2012-11-29 MED ORDER — GLIPIZIDE 10 MG PO TABS
10.0000 mg | ORAL_TABLET | Freq: Every day | ORAL | Status: DC
  Administered 2012-11-30 – 2012-12-02 (×3): 10 mg via ORAL
  Filled 2012-11-29 (×5): qty 1

## 2012-11-29 MED ORDER — VANCOMYCIN HCL IN DEXTROSE 1-5 GM/200ML-% IV SOLN
1000.0000 mg | Freq: Two times a day (BID) | INTRAVENOUS | Status: DC
  Administered 2012-11-29 – 2012-12-02 (×7): 1000 mg via INTRAVENOUS
  Filled 2012-11-29 (×9): qty 200

## 2012-11-29 MED ORDER — COLLAGENASE 250 UNIT/GM EX OINT
TOPICAL_OINTMENT | Freq: Every day | CUTANEOUS | Status: DC
  Administered 2012-11-29 – 2012-12-01 (×3): via TOPICAL
  Administered 2012-12-02: 1 via TOPICAL
  Filled 2012-11-29: qty 30

## 2012-11-29 MED ORDER — PIOGLITAZONE HCL 30 MG PO TABS
30.0000 mg | ORAL_TABLET | Freq: Every day | ORAL | Status: DC
  Administered 2012-11-29 – 2012-12-02 (×4): 30 mg via ORAL
  Filled 2012-11-29 (×5): qty 1

## 2012-11-29 NOTE — Progress Notes (Signed)
[facility] PROGRESS NOTE  Diane Morgan ZOX:[id number] DOB: Jan 21, 1971 DOA: 11/28/2012 PCP: Rudi Heap, MD  Assessment/Plan: Active Problems:   Hypertension   Hyperlipidemia   Type II or unspecified type diabetes mellitus with unspecified complication, uncontrolled   Cellulitis and abscess   GERD (gastroesophageal reflux disease)    1. Abdominal wall cellulitis/Panniculitis:  Patient presented with progressive redness, swelling, induration to region of left lower quadrant, significantly becoming worse in the last several days. She was treated initially with oral Doxycycline and Bactrim therapy, later changed to Clindamycin (first dose on 11/27/12). She was afebrile on admission, wcc 16.5. CT Abdomen/Pelvis showed stranding within the subcutaneous soft tissues, most pronounced in the left lower quadrant. No focal fluid collection to suggest abscess. Managing with IV Vancomycin/Aztreonam, now day # 2. Blood cultures are negative so far. Clinically, patient has already improved, with diminution of local inflammatory phenomena, and wcc is normalized at 7.3.  2. Acute renal failure: At presentation, creatinine was 2.33, against a known baseline creatinine of 1.13 in 07/2012. Likely, this is due to dehydration, in the setting of ACE-i therapy. Lisinopril was discontinued. Managing with iv fluids, and creatinine has already improved at 1.21 today.  3. Hypertension: Controlled on Norvasc 10 mg by mouth daily. 4. Type 2 diabetes mellitus/Diabetic Neuropathy: Patient was on multiple oral hypoglycemics, pre-admission, and random blood glucose was 223. Currently managing with diet/SSI. Control appears sub-optimal. HBA1C is pending. As renal function  Has improved, will reinstate some of her oral hypoglycemics today. On Gabapentin for neuropathy. 5. Dyslipidemia: On Statin therapy. 6. GERD: Asymptomatic.       Code Status: Full Code.  Family Communication:  Disposition Plan: To be determined.     Brief narrative: 42 y.o. female with history of morbid obesity, type 2 diabetes mellitus with neuropathy, hypertension, dyslipidemia, Eczema, GERD, who presents as a direct admission from her primary care physician's office, for evaluation of cellulitis and abscess of abdominal wall. She reports noting a "bump" in her skin in the left lower quadrant abdominal region 2 weeks ago. Over time, this became erythematous, having localized warmth, indurated and painful. Because of worsening symptoms she presented this past weekend to urgent care, where she was diagnosed with an abdominal wall sebaceous cyst and underwent incision and drainage. She was instructed to followup with her primary care provider early this week. Meanwhile patient had been on Doxycycline and Bactrim therapy. There is concerns about possible doxycycline allergy, for which this was changed to monotherapy with Clindamycin which she tolerated well, taking her first dose on 11/27/12. Over the last several days, she has reported severe localized pain along with extension of erythema and induration. She denies purulent discharge. She also complained of malaise, subjective fevers and chills, as well as decreased urinary output, which has been dark and concentrated. Patient reporting compliance to antimicrobial therapy. Lab work done at her primary care physician's office today showed a white count of 16,500 with a BUN of 40 and creatinine of 2.33. She was transferred to this facility for further evaluation and treatment.   Consultants:  N/A.   Procedures:  CT Abdomen/Pelvis.   Antibiotics:  Aztreonam 11/28/12>>>  Vancomycin 11/28/12>>>  HPI/Subjective: Feels much better.   Objective: Vital signs in last 24 hours: Temp:  [96.7 F (35.9 C)-98.4 F (36.9 C)] 98.2 F (36.8 C) (09/11 0524) Pulse Rate:  [99-108] 99 (09/11 0524) Resp:  [18-20] 18 (09/11 0524) BP: (112-142)/(50-69) 116/56 mmHg (09/11 0524) SpO2:  [98 %-100 %] 98 %  (09/11  9562) Weight:  [116.484 kg (256 lb 12.8 oz)-128.368 kg (283 lb)] 116.484 kg (256 lb 12.8 oz) (09/10 1230) Weight change:  Last BM Date: 11/28/12  Intake/Output from previous day: 09/10 0701 - 09/11 0700 In: 3063 [I.V.:3063] Out: -      Physical Exam: General: Comfortable, alert, communicative, fully oriented, not short of breath at rest. Not in acute pain.  HEENT:  Mild clinical pallor, no jaundice, no conjunctival injection or discharge. Hydration is fair.  NECK:  Supple, JVP not seen, no carotid bruits, no palpable lymphadenopathy, no palpable goiter. CHEST:  Clinically clear to auscultation, no wheezes, no crackles. HEART:  Sounds 1 and 2 heard, normal, regular, no murmurs. ABDOMEN:  Morbidly obese, soft, no palpable organomegaly, no palpable masses, normal bowel sounds. Area of erythema/induration is noted in pannus of LLQ, with 2 superficial-appearing wounds ( site of previous I&D).  GENITALIA:  Not examined. LOWER EXTREMITIES:  No pitting edema, palpable peripheral pulses. MUSCULOSKELETAL SYSTEM:  Unremarkable. CENTRAL NERVOUS SYSTEM:  No focal neurologic deficit on gross examination.  Lab Results:  Recent Labs  11/27/12 0942 11/28/12 1518  WBC 16.2* 9.5  HGB 93.1* 9.5*  HCT 27.3* 28.3*  PLT  --  447*    Recent Labs  11/27/12 0931 11/28/12 1518  NA 127* 133*  K 4.8 4.2  CL 90* 102  CO2 17* 18*  GLUCOSE 223* 119*  BUN 41* 29*  CREATININE 2.88* 1.68*  CALCIUM 7.8* 8.5   Recent Results (from the past 240 hour(s))  MRSA PCR SCREENING     Status: Abnormal   Collection Time    11/28/12  2:44 PM      Result Value Range Status   MRSA by PCR POSITIVE (*) NEGATIVE Final   Comment:            The GeneXpert MRSA Assay (FDA     approved for NASAL specimens     only), is one component of a     comprehensive MRSA colonization     surveillance program. It is not     intended to diagnose MRSA     infection nor to guide or     monitor treatment for     MRSA  infections.     RESULT CALLED TO, READ BACK BY AND VERIFIED WITH:     J.Nehemiah Settle (RN) [phone] 11/28/2012 L.LOMAX     Studies/Results: Ct Abdomen Pelvis Wo Contrast  11/28/2012   CLINICAL DATA:  Question abdominal wall abscess. Left lower quadrant induration.  EXAM: CT ABDOMEN AND PELVIS WITHOUT CONTRAST  TECHNIQUE: Multidetector CT imaging of the abdomen and pelvis was performed following the standard protocol without intravenous contrast.  COMPARISON:  None.  FINDINGS: Lung bases are clear. No effusions. Heart is normal size. There is stranding/ edema throughout the subcutaneous soft tissues of the abdominal and pelvic wall anteriorly and laterally. Within the left lower quadrant, more pronounced stranding is noted without focal fluid collection.  Appendix is visualized and is normal. Prior cholecystectomy. Liver, spleen, pancreas, adrenals and kidneys have an unremarkable unenhanced appearance. Uterus, adnexae and urinary bladder are unremarkable. Stomach, large and small bowel grossly unremarkable.  No acute bony abnormality.  IMPRESSION: Stranding within the subcutaneous soft tissues, most pronounced in the left lower quadrant. No focal fluid collection to suggest abscess. Findings could reflect cellulitis/panniculitis.   Electronically Signed   By: Charlett Nose M.D.   On: 11/28/2012 19:22    Medications: Scheduled Meds: . aspirin  81 mg Oral Daily  .  atorvastatin  20 mg Oral q1800  . aztreonam  1 g Intravenous Q8H  . docusate sodium  100 mg Oral BID  . gabapentin  300 mg Oral TID  . heparin  5,000 Units Subcutaneous Q8H  . insulin aspart  0-20 Units Subcutaneous TID WC  . insulin aspart  0-5 Units Subcutaneous QHS  . magnesium sulfate 1 - 4 g bolus IVPB  2 g Intravenous Once  . mupirocin ointment   Nasal BID  . vancomycin  1,500 mg Intravenous Q24H   Continuous Infusions: . sodium chloride 150 mL/hr at 11/29/12 0003   PRN Meds:.acetaminophen, acetaminophen, alum & mag hydroxide-simeth,  bisacodyl, HYDROcodone-acetaminophen, morphine injection, ondansetron (ZOFRAN) IV, ondansetron    LOS: 1 day   Yamilett Anastos,CHRISTOPHER  [facility] Pager [phone]. If 8PM-8AM, please contact night-coverage at [email or web address], password [facility] 11/29/2012, 7:22 AM  LOS: 1 day

## 2012-11-29 NOTE — Consult Note (Signed)
WOC consult Note Reason for Consult: evaluation of abdominal wounds.  Pt reports two areas that looked like "bug bites" that appeared 2 wks ago. She has been treating with antibiotic ointment, until a few days ago when there was more redness and tenderness and she came in for evaluation at that point. She does not report any hyperglycemia prior to this only after lesions appeared. She does not remember any activity outside when she could have gotten bitten by anything.   Wound type: partial thickness wounds left lower pannus, unclear etiology Measurement: 2 1cm x 1cm lesions left lower pannus with associated induration and erythema that extends 12cm x 18cm   Wound bed: most medial is 100% slough, lateral wound is with some yellow but some epithelial buds present. Drainage (amount, consistency, odor) serous, no odor Periwound:see above description of erythema   and induration. Dressing procedure/placement/frequency: santyl for the most medial wound, cover both with foam dressing to absorb exudate. Apply Santyl daily, only change foam every 3 days.   Discussed POC with patient   Re consult if needed, will not follow at this time. Thanks  Adain Geurin [employer], CWOCN [phone])

## 2012-11-29 NOTE — Progress Notes (Signed)
ANTIBIOTIC CONSULT NOTE - Follow-Up  Pharmacy Consult:  Vancomycin / Azactam Indication:  Abdominal wall cellulitis and abscess, likely MRSA  Allergies  Allergen Reactions  . Niaspan [Niacin] Other (See Comments)    Patient passes out.  Marland Kitchen Penicillins Other (See Comments)    Childhood Reaction.    Patient Measurements: Height: 5\' 2"  (157.5 cm) Weight: 256 lb 12.8 oz (116.484 kg) IBW/kg (Calculated) : 50.1  Vital Signs: Temp: 98.2 F (36.8 C) (09/11 0524) Temp src: Oral (09/11 0524) BP: 116/56 mmHg (09/11 0524) Pulse Rate: 99 (09/11 0524)  Labs:  Recent Labs  11/27/12 0931 11/27/12 0942 11/28/12 1518 11/29/12 0620  WBC  --  16.2* 9.5 7.3  HGB  --  93.1* 9.5* 9.4*  PLT  --   --  447* 402*  CREATININE 2.88*  --  1.68* 1.21*   Estimated Creatinine Clearance: 74.8 ml/min (by C-G formula based on Cr of 1.21). No results found for this basename: VANCOTROUGH, Leodis Binet, VANCORANDOM, GENTTROUGH, GENTPEAK, GENTRANDOM, TOBRATROUGH, TOBRAPEAK, TOBRARND, AMIKACINPEAK, AMIKACINTROU, AMIKACIN,  in the last 72 hours   Microbiology: Recent Results (from the past 720 hour(s))  WOUND CULTURE     Status: None   Collection Time    11/28/12  2:43 PM      Result Value Range Status   Specimen Description WOUND LEFT ABDOMEN   Final   Special Requests NONE   Final   Gram Stain     Final   Value: NO WBC SEEN     RARE SQUAMOUS EPITHELIAL CELLS PRESENT     RARE GRAM POSITIVE COCCI     IN PAIRS RARE GRAM NEGATIVE RODS     Performed at [facility]   Culture     Final   Value: NO GROWTH     Performed at [facility]   Report Status PENDING   Incomplete  MRSA PCR SCREENING     Status: Abnormal   Collection Time    11/28/12  2:44 PM      Result Value Range Status   MRSA by PCR POSITIVE (*) NEGATIVE Final   Comment:            The GeneXpert MRSA Assay (FDA     approved for NASAL specimens     only), is one component of a     comprehensive MRSA colonization   surveillance program. It is not     intended to diagnose MRSA     infection nor to guide or     monitor treatment for     MRSA infections.     RESULT CALLED TO, READ BACK BY AND VERIFIED WITH:     J.Nehemiah Settle (RN) [phone] 11/28/2012 L.LOMAX  CULTURE, BLOOD (ROUTINE X 2)     Status: None   Collection Time    11/28/12  3:18 PM      Result Value Range Status   Specimen Description BLOOD RIGHT FOREARM   Final   Special Requests BOTTLES DRAWN AEROBIC ONLY 3CC   Final   Culture  Setup Time     Final   Value: 11/28/2012 21:47     Performed at [facility]   Culture     Final   Value:        BLOOD CULTURE RECEIVED NO GROWTH TO DATE CULTURE WILL BE HELD FOR 5 DAYS BEFORE ISSUING A FINAL NEGATIVE REPORT     Performed at [facility]   Report Status PENDING   Incomplete  CULTURE, BLOOD (  ROUTINE X 2)     Status: None   Collection Time    11/28/12  3:24 PM      Result Value Range Status   Specimen Description BLOOD RIGHT ANTECUBITAL   Final   Special Requests BOTTLES DRAWN AEROBIC ONLY 2CC   Final   Culture  Setup Time     Final   Value: 11/28/2012 21:46     Performed at [facility]   Culture     Final   Value:        BLOOD CULTURE RECEIVED NO GROWTH TO DATE CULTURE WILL BE HELD FOR 5 DAYS BEFORE ISSUING A FINAL NEGATIVE REPORT     Performed at [facility]   Report Status PENDING   Incomplete   Assessment Pt on Vancomycin and Azactam Day#2 for abd wall cellulitis and abscess, c/w MRSA, was on doxy/Bactrim, tolerated Clinda better (started 9/9), had purulent discharge, s/p drainage. WBC down to nl. Afeb. Pt admitted with acute renal dysfunction. SCr now improved to 1.21 and pt making urine.  Vanc 9/10 >> Azactam 9/10 >>  9/10 Bld x 2>> ngtd 9/10 Wound >> ngtd  Goal of Therapy:  Vancomycin trough level 15-20 mcg/ml   Plan:  - Change Vancomycin to 1000mg  IV Q12H - Continue Azactam 1gm IV Q8H - Monitor renal fxn, clinical course, microbiological data, vanc  trough at C[facility]  Christoper Fabian, PharmD, BCPS Clinical pharmacist, pager [phone] 11/29/2012, 9:51 AM

## 2012-11-30 ENCOUNTER — Encounter: Payer: Medicaid Other | Admitting: Family Medicine

## 2012-11-30 ENCOUNTER — Encounter: Payer: Medicaid Other | Admitting: Nurse Practitioner

## 2012-11-30 LAB — CBC
Hemoglobin: 9.8 g/dL — ABNORMAL LOW (ref 12.0–15.0)
MCH: 24.5 pg — ABNORMAL LOW (ref 26.0–34.0)
MCV: 74.3 fL — ABNORMAL LOW (ref 78.0–100.0)
Platelets: 426 10*3/uL — ABNORMAL HIGH (ref 150–400)
RBC: 4 MIL/uL (ref 3.87–5.11)
WBC: 9.6 10*3/uL (ref 4.0–10.5)

## 2012-11-30 LAB — GLUCOSE, CAPILLARY
Glucose-Capillary: 182 mg/dL — ABNORMAL HIGH (ref 70–99)
Glucose-Capillary: 188 mg/dL — ABNORMAL HIGH (ref 70–99)
Glucose-Capillary: 142 mg/dL — ABNORMAL HIGH (ref 70–99)

## 2012-11-30 LAB — BASIC METABOLIC PANEL
CO2: 21 mEq/L (ref 19–32)
Calcium: 8.4 mg/dL (ref 8.4–10.5)
Chloride: 105 mEq/L (ref 96–112)
Glucose, Bld: 179 mg/dL — ABNORMAL HIGH (ref 70–99)
Sodium: 135 mEq/L (ref 135–145)

## 2012-11-30 MED ORDER — MUPIROCIN 2 % EX OINT: 1.0000 "application " | TOPICAL_OINTMENT | Freq: Two times a day (BID) | CUTANEOUS | Status: DC

## 2012-11-30 MED ORDER — CHLORHEXIDINE GLUCONATE CLOTH 2 % EX PADS
6.0000 | MEDICATED_PAD | Freq: Every day | CUTANEOUS | Status: DC
  Administered 2012-11-30 – 2012-12-02 (×3): 6 via TOPICAL

## 2012-11-30 NOTE — Progress Notes (Signed)
[facility] PROGRESS NOTE  Diane Morgan WJX:[id number] DOB: June 10, 1970 DOA: 11/28/2012 PCP: Rudi Heap, MD  Assessment/Plan: Active Problems:   Hypertension   Hyperlipidemia   Type II or unspecified type diabetes mellitus with unspecified complication, uncontrolled   Cellulitis and abscess   GERD (gastroesophageal reflux disease)    1. Abdominal wall cellulitis/Panniculitis:  Patient presented with progressive redness, swelling, induration to region of left lower quadrant, significantly becoming worse in the last several days pre-admission. She was treated initially with oral Doxycycline and Bactrim therapy, later changed to Clindamycin (first dose on 11/27/12). She was afebrile on admission, wcc 16.5. CT Abdomen/Pelvis showed stranding within the subcutaneous soft tissues, most pronounced in the left lower quadrant. No focal fluid collection to suggest abscess. Managing with IV Vancomycin/Aztreonam, now day # 3. Blood cultures are negative so far. Clinically, patient is gradually improving, albeit slowly, with diminution of local inflammatory phenomena, and wcc normalized at 7.3, as of 11/29/12.  2. Acute renal failure: At presentation, creatinine was 2.33, against a known baseline creatinine of 1.13 in 07/2012. Likely, this is due to dehydration, in the setting of ACE-i therapy. Lisinopril was discontinued. Managed with iv fluids, and creatinine has normalized at 0.90 today. Have discontinued iv fluids.  3. Hypertension: Controlled on Norvasc 10 mg by mouth daily. 4. Type 2 diabetes mellitus/Diabetic Neuropathy: Patient was on multiple oral hypoglycemics, pre-admission, and random blood glucose was 223. Currently managing with diet/SSI. HBA1C is 6.5. Oral hypoglycemics were reinstated on 11/29/12, with improvement in renal function. On Gabapentin for neuropathy. 5. Dyslipidemia: On Statin therapy. 6. GERD: Asymptomatic.       Code Status: Full Code.  Family Communication:   Disposition Plan: To be determined. Aiming discharge on 12/02/12.    Brief narrative: 42 y.o. female with history of morbid obesity, type 2 diabetes mellitus with neuropathy, hypertension, dyslipidemia, Eczema, GERD, who presents as a direct admission from her primary care physician's office, for evaluation of cellulitis and abscess of abdominal wall. She reports noting a "bump" in her skin in the left lower quadrant abdominal region 2 weeks ago. Over time, this became erythematous, having localized warmth, indurated and painful. Because of worsening symptoms she presented this past weekend to urgent care, where she was diagnosed with an abdominal wall sebaceous cyst and underwent incision and drainage. She was instructed to followup with her primary care provider early this week. Meanwhile patient had been on Doxycycline and Bactrim therapy. There is concerns about possible doxycycline allergy, for which this was changed to monotherapy with Clindamycin which she tolerated well, taking her first dose on 11/27/12. Over the last several days, she has reported severe localized pain along with extension of erythema and induration. She denies purulent discharge. She also complained of malaise, subjective fevers and chills, as well as decreased urinary output, which has been dark and concentrated. Patient reporting compliance to antimicrobial therapy. Lab work done at her primary care physician's office today showed a white count of 16,500 with a BUN of 40 and creatinine of 2.33. She was transferred to this facility for further evaluation and treatment.   Consultants:  N/A.   Procedures:  CT Abdomen/Pelvis.   Antibiotics:  Aztreonam 11/28/12>>>  Vancomycin 11/28/12>>>  HPI/Subjective: No new issues.   Objective: Vital signs in last 24 hours: Temp:  [97.5 F (36.4 C)-98.2 F (36.8 C)] 97.5 F (36.4 C) (09/12 1401) Pulse Rate:  [73-95] 73 (09/12 1401) Resp:  [16-18] 18 (09/12 1401) BP:  (124-135)/(60-71) 134/63 mmHg (09/12 1401) SpO2:  [  97 %-100 %] 97 % (09/12 1401) Weight change:  Last BM Date: 11/29/12  Intake/Output from previous day: 09/11 0701 - 09/12 0700 In: 3057.5 [P.O.:360; I.V.:1997.5; IV Piggyback:700] Out: -  Total I/O In: 280 [P.O.:280] Out: -    Physical Exam: General: Comfortable, alert, communicative, fully oriented, not short of breath at rest. Not in acute pain.  HEENT:  Mild clinical pallor, no jaundice, no conjunctival injection or discharge. Hydration is fair.  NECK:  Supple, JVP not seen, no carotid bruits, no palpable lymphadenopathy, no palpable goiter. CHEST:  Clinically clear to auscultation, no wheezes, no crackles. HEART:  Sounds 1 and 2 heard, normal, regular, no murmurs. ABDOMEN:  Morbidly obese, soft, no palpable organomegaly, no palpable masses, normal bowel sounds. Area of erythema/induration is noted in pannus of LLQ, with 2 superficial-appearing wounds ( site of previous I&D), now under occlusive dressing.  GENITALIA:  Not examined. LOWER EXTREMITIES:  No pitting edema, palpable peripheral pulses. MUSCULOSKELETAL SYSTEM:  Unremarkable. CENTRAL NERVOUS SYSTEM:  No focal neurologic deficit on gross examination.  Lab Results:  Recent Labs  11/29/12 0620 11/30/12 0510  WBC 7.3 9.6  HGB 9.4* 9.8*  HCT 28.5* 29.7*  PLT 402* 426*    Recent Labs  11/29/12 0620 11/30/12 0510  NA 137 135  K 4.6 4.8  CL 107 105  CO2 19 21  GLUCOSE 200* 179*  BUN 20 12  CREATININE 1.21* 0.90  CALCIUM 8.6 8.4   Recent Results (from the past 240 hour(s))  WOUND CULTURE     Status: None   Collection Time    11/28/12  2:43 PM      Result Value Range Status   Specimen Description WOUND LEFT ABDOMEN   Final   Special Requests NONE   Final   Gram Stain     Final   Value: NO WBC SEEN     RARE SQUAMOUS EPITHELIAL CELLS PRESENT     RARE GRAM POSITIVE COCCI     IN PAIRS RARE GRAM NEGATIVE RODS     Performed at [facility]   Culture      Final   Value: Culture reincubated for better growth     Performed at [facility]   Report Status PENDING   Incomplete  MRSA PCR SCREENING     Status: Abnormal   Collection Time    11/28/12  2:44 PM      Result Value Range Status   MRSA by PCR POSITIVE (*) NEGATIVE Final   Comment:            The GeneXpert MRSA Assay (FDA     approved for NASAL specimens     only), is one component of a     comprehensive MRSA colonization     surveillance program. It is not     intended to diagnose MRSA     infection nor to guide or     monitor treatment for     MRSA infections.     RESULT CALLED TO, READ BACK BY AND VERIFIED WITH:     J.Nehemiah Settle (RN) [phone] 11/28/2012 L.LOMAX  CULTURE, BLOOD (ROUTINE X 2)     Status: None   Collection Time    11/28/12  3:18 PM      Result Value Range Status   Specimen Description BLOOD RIGHT FOREARM   Final   Special Requests BOTTLES DRAWN AEROBIC ONLY 3CC   Final   Culture  Setup Time     Final  Value: 11/28/2012 21:47     Performed at [facility]   Culture     Final   Value:        BLOOD CULTURE RECEIVED NO GROWTH TO DATE CULTURE WILL BE HELD FOR 5 DAYS BEFORE ISSUING A FINAL NEGATIVE REPORT     Performed at [facility]   Report Status PENDING   Incomplete  CULTURE, BLOOD (ROUTINE X 2)     Status: None   Collection Time    11/28/12  3:24 PM      Result Value Range Status   Specimen Description BLOOD RIGHT ANTECUBITAL   Final   Special Requests BOTTLES DRAWN AEROBIC ONLY 2CC   Final   Culture  Setup Time     Final   Value: 11/28/2012 21:46     Performed at [facility]   Culture     Final   Value:        BLOOD CULTURE RECEIVED NO GROWTH TO DATE CULTURE WILL BE HELD FOR 5 DAYS BEFORE ISSUING A FINAL NEGATIVE REPORT     Performed at [facility]   Report Status PENDING   Incomplete     Studies/Results: Ct Abdomen Pelvis Wo Contrast  11/28/2012   CLINICAL DATA:  Question abdominal wall abscess. Left lower  quadrant induration.  EXAM: CT ABDOMEN AND PELVIS WITHOUT CONTRAST  TECHNIQUE: Multidetector CT imaging of the abdomen and pelvis was performed following the standard protocol without intravenous contrast.  COMPARISON:  None.  FINDINGS: Lung bases are clear. No effusions. Heart is normal size. There is stranding/ edema throughout the subcutaneous soft tissues of the abdominal and pelvic wall anteriorly and laterally. Within the left lower quadrant, more pronounced stranding is noted without focal fluid collection.  Appendix is visualized and is normal. Prior cholecystectomy. Liver, spleen, pancreas, adrenals and kidneys have an unremarkable unenhanced appearance. Uterus, adnexae and urinary bladder are unremarkable. Stomach, large and small bowel grossly unremarkable.  No acute bony abnormality.  IMPRESSION: Stranding within the subcutaneous soft tissues, most pronounced in the left lower quadrant. No focal fluid collection to suggest abscess. Findings could reflect cellulitis/panniculitis.   Electronically Signed   By: Charlett Nose M.D.   On: 11/28/2012 19:22    Medications: Scheduled Meds: . aspirin  81 mg Oral Daily  . atorvastatin  20 mg Oral q1800  . aztreonam  1 g Intravenous Q8H  . Chlorhexidine Gluconate Cloth  6 each Topical Q0600  . collagenase   Topical Daily  . docusate sodium  100 mg Oral BID  . gabapentin  300 mg Oral TID  . glipiZIDE  10 mg Oral QAC breakfast  . heparin  5,000 Units Subcutaneous Q8H  . insulin aspart  0-20 Units Subcutaneous TID WC  . insulin aspart  0-5 Units Subcutaneous QHS  . linagliptin  5 mg Oral Daily  . mupirocin ointment   Nasal BID  . pioglitazone  30 mg Oral Daily  . vancomycin  1,000 mg Intravenous Q12H   Continuous Infusions: . sodium chloride 100 mL/hr at 11/30/12 0758   PRN Meds:.acetaminophen, acetaminophen, alum & mag hydroxide-simeth, bisacodyl, HYDROcodone-acetaminophen, morphine injection, ondansetron (ZOFRAN) IV, ondansetron    LOS: 2  days   Diane Morgan,Diane Morgan  [facility] Pager [phone]. If 8PM-8AM, please contact night-coverage at [email or web address], password [email or web address] 11/30/2012, 2:27 PM  LOS: 2 days

## 2012-12-01 DIAGNOSIS — E1165 Type 2 diabetes mellitus with hyperglycemia: Secondary | ICD-10-CM

## 2012-12-01 LAB — GLUCOSE, CAPILLARY: Glucose-Capillary: 120 mg/dL — ABNORMAL HIGH (ref 70–99)

## 2012-12-01 LAB — BASIC METABOLIC PANEL
Calcium: 8.5 mg/dL (ref 8.4–10.5)
GFR calc Af Amer: >90 mL/min (ref >90)
GFR calc non Af Amer: >90 mL/min (ref >90)
Glucose, Bld: 197 mg/dL — ABNORMAL HIGH (ref 70–99)
Potassium: 4.5 mEq/L (ref 3.5–5.1)
Sodium: 134 mEq/L — ABNORMAL LOW (ref 135–145)

## 2012-12-01 LAB — CBC
MCH: 24.5 pg — ABNORMAL LOW (ref 26.0–34.0)
MCHC: 32.4 g/dL (ref 30.0–36.0)
Platelets: 383 10*3/uL (ref 150–400)
RDW: 17.3 % — ABNORMAL HIGH (ref 11.5–15.5)

## 2012-12-01 LAB — VANCOMYCIN, TROUGH: Vancomycin Tr: 18 ug/mL (ref 10.0–20.0)

## 2012-12-01 MED ORDER — METFORMIN HCL 500 MG PO TABS
1000.0000 mg | ORAL_TABLET | Freq: Two times a day (BID) | ORAL | Status: DC
  Administered 2012-12-01 – 2012-12-02 (×2): 1000 mg via ORAL
  Filled 2012-12-01 (×4): qty 2

## 2012-12-01 NOTE — Progress Notes (Signed)
ANTIBIOTIC CONSULT NOTE - FOLLOW UP  Pharmacy Consult for Vancomycin Indication: abdominal wall cellulitis and abscess, likely MRSA  Allergies  Allergen Reactions  . Niaspan [Niacin] Other (See Comments)    Patient passes out.  Marland Kitchen Penicillins Other (See Comments)    Childhood Reaction.    Patient Measurements: Height: 5\' 2"  (157.5 cm) Weight: 256 lb 12.8 oz (116.484 kg) IBW/kg (Calculated) : 50.1  Vital Signs: Temp: 97.4 F (36.3 C) (09/13 1407) Temp src: Oral (09/13 1407) BP: 127/63 mmHg (09/13 1407) Pulse Rate: 104 (09/13 1407) Intake/Output from previous day: 09/12 0701 - 09/13 0700 In: 400 [P.O.:400] Out: -  Intake/Output from this shift:    Labs:  Recent Labs  11/29/12 0620 11/30/12 0510 12/01/12 0450  WBC 7.3 9.6 8.4  HGB 9.4* 9.8* 9.4*  PLT 402* 426* 383  CREATININE 1.21* 0.90 0.80   Estimated Creatinine Clearance: 113.2 ml/min (by C-G formula based on Cr of 0.8).  Recent Labs  12/01/12 1025 12/01/12 2107  VANCOTROUGH 29.9* 18.0     Microbiology: Recent Results (from the past 720 hour(s))  WOUND CULTURE     Status: None   Collection Time    11/28/12  2:43 PM      Result Value Range Status   Specimen Description WOUND LEFT ABDOMEN   Final   Special Requests NONE   Final   Gram Stain     Final   Value: NO WBC SEEN     RARE SQUAMOUS EPITHELIAL CELLS PRESENT     RARE GRAM POSITIVE COCCI     IN PAIRS RARE GRAM NEGATIVE RODS     Performed at [facility]   Culture     Final   Value: ABUNDANT STAPHYLOCOCCUS AUREUS     Note: RIFAMPIN AND GENTAMICIN SHOULD NOT BE USED AS SINGLE DRUGS FOR TREATMENT OF STAPH INFECTIONS.     Performed at [facility]   Report Status PENDING   Incomplete  MRSA PCR SCREENING     Status: Abnormal   Collection Time    11/28/12  2:44 PM      Result Value Range Status   MRSA by PCR POSITIVE (*) NEGATIVE Final   Comment:            The GeneXpert MRSA Assay (FDA     approved for NASAL specimens   only), is one component of a     comprehensive MRSA colonization     surveillance program. It is not     intended to diagnose MRSA     infection nor to guide or     monitor treatment for     MRSA infections.     RESULT CALLED TO, READ BACK BY AND VERIFIED WITH:     J.Nehemiah Settle (RN) [phone] 11/28/2012 L.LOMAX  CULTURE, BLOOD (ROUTINE X 2)     Status: None   Collection Time    11/28/12  3:18 PM      Result Value Range Status   Specimen Description BLOOD RIGHT FOREARM   Final   Special Requests BOTTLES DRAWN AEROBIC ONLY 3CC   Final   Culture  Setup Time     Final   Value: 11/28/2012 21:47     Performed at [facility]   Culture     Final   Value:        BLOOD CULTURE RECEIVED NO GROWTH TO DATE CULTURE WILL BE HELD FOR 5 DAYS BEFORE ISSUING A FINAL NEGATIVE REPORT     Performed at  [facility]   Report Status PENDING   Incomplete  CULTURE, BLOOD (ROUTINE X 2)     Status: None   Collection Time    11/28/12  3:24 PM      Result Value Range Status   Specimen Description BLOOD RIGHT ANTECUBITAL   Final   Special Requests BOTTLES DRAWN AEROBIC ONLY 2CC   Final   Culture  Setup Time     Final   Value: 11/28/2012 21:46     Performed at [facility]   Culture     Final   Value:        BLOOD CULTURE RECEIVED NO GROWTH TO DATE CULTURE WILL BE HELD FOR 5 DAYS BEFORE ISSUING A FINAL NEGATIVE REPORT     Performed at [facility]   Report Status PENDING   Incomplete    Anti-infectives   Start     Dose/Rate Route Frequency Ordered Stop   11/29/12 1500  vancomycin (VANCOCIN) 1,500 mg in sodium chloride 0.9 % 500 mL IVPB  Status:  Discontinued     1,500 mg 250 mL/hr over 120 Minutes Intravenous Every 24 hours 11/28/12 1343 11/29/12 0954   11/29/12 1000  vancomycin (VANCOCIN) IVPB 1000 mg/200 mL premix     1,000 mg 200 mL/hr over 60 Minutes Intravenous Every 12 hours 11/29/12 0954     11/28/12 2200  aztreonam (AZACTAM) 1 g in dextrose 5 % 50 mL IVPB  Status:   Discontinued     1 g 100 mL/hr over 30 Minutes Intravenous 3 times per day 11/28/12 1343 12/01/12 1315   11/28/12 1345  vancomycin (VANCOCIN) 2,000 mg in sodium chloride 0.9 % 500 mL IVPB     2,000 mg 250 mL/hr over 120 Minutes Intravenous  Once 11/28/12 1343 11/28/12 1616   11/28/12 1345  aztreonam (AZACTAM) 1 g in dextrose 5 % 50 mL IVPB     1 g 100 mL/hr over 30 Minutes Intravenous  Once 11/28/12 1343 11/28/12 1446   11/28/12 1330  aztreonam (AZACTAM) injection 1 g  Status:  Discontinued     1 g Intravenous Every 12 hours 11/28/12 1320 11/28/12 1342      Assessment: 42 year old female on Day #3 of Vancomycin and Aztreonam for abdominal wall cellulitis with abscess.  Her Vancomycin trough is therapeutic.  Goal of Therapy:  Vancomycin trough level 15-20 mcg/ml  Plan:  Continue Vancomycin 1gm IV q12h Monitor renal function  Estella Husk, Pharm.D., BCPS, AAHIVP Clinical Pharmacist Phone: [phone] or [phone] 12/01/2012, 10:02 PM

## 2012-12-01 NOTE — Progress Notes (Signed)
[facility] PROGRESS NOTE  Diane Morgan ZOX:[id number] DOB: May 10, 1970 DOA: 11/28/2012 PCP: Rudi Heap, MD  Assessment/Plan: Active Problems:   Hypertension   Hyperlipidemia   Type II or unspecified type diabetes mellitus with unspecified complication, uncontrolled   Cellulitis and abscess   GERD (gastroesophageal reflux disease)    1. Abdominal wall cellulitis/Panniculitis:  Patient presented with progressive redness, swelling, induration to region of left lower quadrant, significantly becoming worse in the last several days pre-admission. She was treated initially with oral Doxycycline and Bactrim therapy, later changed to Clindamycin (first dose on 11/27/12). She was afebrile on admission, wcc 16.5. CT Abdomen/Pelvis showed stranding within the subcutaneous soft tissues, most pronounced in the left lower quadrant. No focal fluid collection to suggest abscess. Managing with IV Vancomycin/Aztreonam, now day # 4. Blood cultures are negative so far, but wound cultures revealed abundant Staph Aureus. Clinically, patient is gradually improving, albeit slowly, with diminution of local inflammatory phenomena, and wcc normalized at 7.3, as of 11/29/12. Have discontinued Aztreonam today.  2. Acute renal failure: At presentation, creatinine was 2.33, against a known baseline creatinine of 1.13 in 07/2012. Likely, this is due to dehydration, in the setting of ACE-i therapy. Lisinopril was discontinued. Managed with iv fluids, and creatinine has normalized at 0.90 today. Have discontinued iv fluids on 11/30/12.  3. Hypertension: Controlled on Norvasc 10 mg by mouth daily. 4. Type 2 diabetes mellitus/Diabetic Neuropathy: Patient was on multiple oral hypoglycemics, pre-admission, and random blood glucose was 223. Currently managing with diet/SSI. HBA1C is 6.5. Oral hypoglycemics were reinstated on 11/29/12, with improvement in renal function. On Gabapentin for neuropathy. 5. Dyslipidemia: On Statin  therapy. 6. GERD: Asymptomatic.       Code Status: Full Code.  Family Communication:  Disposition Plan: To be determined. Aiming discharge on 12/02/12.    Brief narrative: 42 y.o. female with history of morbid obesity, type 2 diabetes mellitus with neuropathy, hypertension, dyslipidemia, Eczema, GERD, who presents as a direct admission from her primary care physician's office, for evaluation of cellulitis and abscess of abdominal wall. She reports noting a "bump" in her skin in the left lower quadrant abdominal region 2 weeks ago. Over time, this became erythematous, having localized warmth, indurated and painful. Because of worsening symptoms she presented this past weekend to urgent care, where she was diagnosed with an abdominal wall sebaceous cyst and underwent incision and drainage. She was instructed to followup with her primary care provider early this week. Meanwhile patient had been on Doxycycline and Bactrim therapy. There is concerns about possible doxycycline allergy, for which this was changed to monotherapy with Clindamycin which she tolerated well, taking her first dose on 11/27/12. Over the last several days, she has reported severe localized pain along with extension of erythema and induration. She denies purulent discharge. She also complained of malaise, subjective fevers and chills, as well as decreased urinary output, which has been dark and concentrated. Patient reporting compliance to antimicrobial therapy. Lab work done at her primary care physician's office today showed a white count of 16,500 with a BUN of 40 and creatinine of 2.33. She was transferred to this facility for further evaluation and treatment.   Consultants:  N/A.   Procedures:  CT Abdomen/Pelvis.   Antibiotics:  Aztreonam 11/28/12-12/01/12.  Vancomycin 11/28/12>>>  HPI/Subjective: Feels much better.   Objective: Vital signs in last 24 hours: Temp:  [97.5 F (36.4 C)-97.8 F (36.6 C)] 97.6 F (36.4  C) (09/13 0528) Pulse Rate:  [73-95] 88 (09/13 0528) Resp:  [  18] 18 (09/13 0528) BP: (116-134)/(63-77) 126/77 mmHg (09/13 0528) SpO2:  [97 %-98 %] 98 % (09/13 0528) Weight change:  Last BM Date: 11/29/12  Intake/Output from previous day: 09/12 0701 - 09/13 0700 In: 400 [P.O.:400] Out: -      Physical Exam: General: Comfortable, alert, communicative, fully oriented, not short of breath at rest. Not in acute pain.  HEENT:  Mild clinical pallor, no jaundice, no conjunctival injection or discharge. Hydration is fair.  NECK:  Supple, JVP not seen, no carotid bruits, no palpable lymphadenopathy, no palpable goiter. CHEST:  Clinically clear to auscultation, no wheezes, no crackles. HEART:  Sounds 1 and 2 heard, normal, regular, no murmurs. ABDOMEN:  Morbidly obese, soft, no palpable organomegaly, no palpable masses, normal bowel sounds. Area of erythema/induration in pannus of LLQ has markedly improved. The 2 superficial-appearing wounds ( site of previous I&D), now under occlusive dressing.  GENITALIA:  Not examined. LOWER EXTREMITIES:  No pitting edema, palpable peripheral pulses. MUSCULOSKELETAL SYSTEM:  Unremarkable. CENTRAL NERVOUS SYSTEM:  No focal neurologic deficit on gross examination.  Lab Results:  Recent Labs  11/30/12 0510 12/01/12 0450  WBC 9.6 8.4  HGB 9.8* 9.4*  HCT 29.7* 29.0*  PLT 426* 383    Recent Labs  11/30/12 0510 12/01/12 0450  NA 135 134*  K 4.8 4.5  CL 105 103  CO2 21 22  GLUCOSE 179* 197*  BUN 12 10  CREATININE 0.90 0.80  CALCIUM 8.4 8.5   Recent Results (from the past 240 hour(s))  WOUND CULTURE     Status: None   Collection Time    11/28/12  2:43 PM      Result Value Range Status   Specimen Description WOUND LEFT ABDOMEN   Final   Special Requests NONE   Final   Gram Stain     Final   Value: NO WBC SEEN     RARE SQUAMOUS EPITHELIAL CELLS PRESENT     RARE GRAM POSITIVE COCCI     IN PAIRS RARE GRAM NEGATIVE RODS     Performed at  [facility]   Culture     Final   Value: ABUNDANT STAPHYLOCOCCUS AUREUS     Note: RIFAMPIN AND GENTAMICIN SHOULD NOT BE USED AS SINGLE DRUGS FOR TREATMENT OF STAPH INFECTIONS.     Performed at [facility]   Report Status PENDING   Incomplete  MRSA PCR SCREENING     Status: Abnormal   Collection Time    11/28/12  2:44 PM      Result Value Range Status   MRSA by PCR POSITIVE (*) NEGATIVE Final   Comment:            The GeneXpert MRSA Assay (FDA     approved for NASAL specimens     only), is one component of a     comprehensive MRSA colonization     surveillance program. It is not     intended to diagnose MRSA     infection nor to guide or     monitor treatment for     MRSA infections.     RESULT CALLED TO, READ BACK BY AND VERIFIED WITH:     J.Nehemiah Settle (RN) [phone] 11/28/2012 L.LOMAX  CULTURE, BLOOD (ROUTINE X 2)     Status: None   Collection Time    11/28/12  3:18 PM      Result Value Range Status   Specimen Description BLOOD RIGHT FOREARM   Final   Special  Requests BOTTLES DRAWN AEROBIC ONLY 3CC   Final   Culture  Setup Time     Final   Value: 11/28/2012 21:47     Performed at [facility]   Culture     Final   Value:        BLOOD CULTURE RECEIVED NO GROWTH TO DATE CULTURE WILL BE HELD FOR 5 DAYS BEFORE ISSUING A FINAL NEGATIVE REPORT     Performed at [facility]   Report Status PENDING   Incomplete  CULTURE, BLOOD (ROUTINE X 2)     Status: None   Collection Time    11/28/12  3:24 PM      Result Value Range Status   Specimen Description BLOOD RIGHT ANTECUBITAL   Final   Special Requests BOTTLES DRAWN AEROBIC ONLY 2CC   Final   Culture  Setup Time     Final   Value: 11/28/2012 21:46     Performed at [facility]   Culture     Final   Value:        BLOOD CULTURE RECEIVED NO GROWTH TO DATE CULTURE WILL BE HELD FOR 5 DAYS BEFORE ISSUING A FINAL NEGATIVE REPORT     Performed at [facility]   Report Status PENDING   Incomplete      Studies/Results: No results found.  Medications: Scheduled Meds: . aspirin  81 mg Oral Daily  . atorvastatin  20 mg Oral q1800  . aztreonam  1 g Intravenous Q8H  . Chlorhexidine Gluconate Cloth  6 each Topical Q0600  . collagenase   Topical Daily  . docusate sodium  100 mg Oral BID  . gabapentin  300 mg Oral TID  . glipiZIDE  10 mg Oral QAC breakfast  . heparin  5,000 Units Subcutaneous Q8H  . insulin aspart  0-20 Units Subcutaneous TID WC  . insulin aspart  0-5 Units Subcutaneous QHS  . linagliptin  5 mg Oral Daily  . mupirocin ointment   Nasal BID  . pioglitazone  30 mg Oral Daily  . vancomycin  1,000 mg Intravenous Q12H   Continuous Infusions:   PRN Meds:.acetaminophen, acetaminophen, alum & mag hydroxide-simeth, bisacodyl, HYDROcodone-acetaminophen, morphine injection, ondansetron (ZOFRAN) IV, ondansetron    LOS: 3 days   Raford Brissett,CHRISTOPHER  [facility] Pager [phone]. If 8PM-8AM, please contact night-coverage at [email or web address], password [facility] 12/01/2012, 1:10 PM  LOS: 3 days

## 2012-12-01 NOTE — Progress Notes (Signed)
ANTIBIOTIC CONSULT NOTE - INITIAL  Pharmacy Consult:  Vancomycin / Azactam Indication:  Abdominal wall cellulitis and abscess, likely MRSA  Allergies  Allergen Reactions  . Niaspan [Niacin] Other (See Comments)    Patient passes out.  Marland Kitchen Penicillins Other (See Comments)    Childhood Reaction.    Patient Measurements: Height: 5\' 2"  (157.5 cm) Weight: 256 lb 12.8 oz (116.484 kg) IBW/kg (Calculated) : 50.1  Vital Signs: Temp: 97.6 F (36.4 C) (09/13 0528) Temp src: Oral (09/13 0528) BP: 126/77 mmHg (09/13 0528) Pulse Rate: 88 (09/13 0528)  Labs:  Recent Labs  11/29/12 0620 11/30/12 0510 12/01/12 0450  WBC 7.3 9.6 8.4  HGB 9.4* 9.8* 9.4*  PLT 402* 426* 383  CREATININE 1.21* 0.90 0.80   Estimated Creatinine Clearance: 113.2 ml/min (by C-G formula based on Cr of 0.8).  Recent Labs  12/01/12 1025  VANCOTROUGH 29.9*     Microbiology: Recent Results (from the past 720 hour(s))  WOUND CULTURE     Status: None   Collection Time    11/28/12  2:43 PM      Result Value Range Status   Specimen Description WOUND LEFT ABDOMEN   Final   Special Requests NONE   Final   Gram Stain     Final   Value: NO WBC SEEN     RARE SQUAMOUS EPITHELIAL CELLS PRESENT     RARE GRAM POSITIVE COCCI     IN PAIRS RARE GRAM NEGATIVE RODS     Performed at [facility]   Culture     Final   Value: ABUNDANT STAPHYLOCOCCUS AUREUS     Note: RIFAMPIN AND GENTAMICIN SHOULD NOT BE USED AS SINGLE DRUGS FOR TREATMENT OF STAPH INFECTIONS.     Performed at [facility]   Report Status PENDING   Incomplete  MRSA PCR SCREENING     Status: Abnormal   Collection Time    11/28/12  2:44 PM      Result Value Range Status   MRSA by PCR POSITIVE (*) NEGATIVE Final   Comment:            The GeneXpert MRSA Assay (FDA     approved for NASAL specimens     only), is one component of a     comprehensive MRSA colonization     surveillance program. It is not     intended to diagnose MRSA   infection nor to guide or     monitor treatment for     MRSA infections.     RESULT CALLED TO, READ BACK BY AND VERIFIED WITH:     J.Nehemiah Settle (RN) [phone] 11/28/2012 L.LOMAX  CULTURE, BLOOD (ROUTINE X 2)     Status: None   Collection Time    11/28/12  3:18 PM      Result Value Range Status   Specimen Description BLOOD RIGHT FOREARM   Final   Special Requests BOTTLES DRAWN AEROBIC ONLY 3CC   Final   Culture  Setup Time     Final   Value: 11/28/2012 21:47     Performed at [facility]   Culture     Final   Value:        BLOOD CULTURE RECEIVED NO GROWTH TO DATE CULTURE WILL BE HELD FOR 5 DAYS BEFORE ISSUING A FINAL NEGATIVE REPORT     Performed at [facility]   Report Status PENDING   Incomplete  CULTURE, BLOOD (ROUTINE X 2)     Status:  None   Collection Time    11/28/12  3:24 PM      Result Value Range Status   Specimen Description BLOOD RIGHT ANTECUBITAL   Final   Special Requests BOTTLES DRAWN AEROBIC ONLY 2CC   Final   Culture  Setup Time     Final   Value: 11/28/2012 21:46     Performed at [facility]   Culture     Final   Value:        BLOOD CULTURE RECEIVED NO GROWTH TO DATE CULTURE WILL BE HELD FOR 5 DAYS BEFORE ISSUING A FINAL NEGATIVE REPORT     Performed at [facility]   Report Status PENDING   Incomplete      Assessment 42 YOF with abdominal wall erythema directly admitted from her PCP's office.  Patient was previously treated with doxycycline and Septra without improvement.  Noted abscess was drained.  Pharmacy consulted to manage vancomycin and Azactam for abdominal wall cellulitis and abscess, concerning with MRSA infection.   Vancomycin trough was drawn while vancomycin dose was infusing - this is not a true trough.    Goal of Therapy:  Vancomycin trough level 15-20 mcg/ml   Plan:  Repeat vancomycin trough this evening  Celedonio Miyamoto, PharmD, [facility] Clinical Pharmacist Pager [phone]    12/01/2012, 11:33 AM

## 2012-12-02 LAB — CBC
Hemoglobin: 10 g/dL — ABNORMAL LOW (ref 12.0–15.0)
MCHC: 32.1 g/dL (ref 30.0–36.0)
RDW: 17.1 % — ABNORMAL HIGH (ref 11.5–15.5)
WBC: 9.9 10*3/uL (ref 4.0–10.5)

## 2012-12-02 LAB — WOUND CULTURE: Gram Stain: NONE SEEN

## 2012-12-02 LAB — BASIC METABOLIC PANEL
Chloride: 101 mEq/L (ref 96–112)
GFR calc Af Amer: >90 mL/min (ref >90)
GFR calc non Af Amer: 80 mL/min — ABNORMAL LOW (ref >90)
Glucose, Bld: 174 mg/dL — ABNORMAL HIGH (ref 70–99)
Potassium: 4 mEq/L (ref 3.5–5.1)
Sodium: 134 mEq/L — ABNORMAL LOW (ref 135–145)

## 2012-12-02 LAB — GLUCOSE, CAPILLARY: Glucose-Capillary: 103 mg/dL — ABNORMAL HIGH (ref 70–99)

## 2012-12-02 MED ORDER — METFORMIN HCL 1000 MG PO TABS: 1000.0000 mg | ORAL_TABLET | Freq: Two times a day (BID) | ORAL | Status: DC

## 2012-12-02 MED ORDER — SAXAGLIPTIN HCL 5 MG PO TABS: 5.0000 mg | ORAL_TABLET | Freq: Every day | ORAL | Status: DC

## 2012-12-02 MED ORDER — LISINOPRIL 5 MG PO TABS: 5.0000 mg | ORAL_TABLET | Freq: Every day | ORAL | Status: DC

## 2012-12-02 MED ORDER — GLIPIZIDE 10 MG PO TABS: 10.0000 mg | ORAL_TABLET | Freq: Every day | ORAL | Status: DC

## 2012-12-02 MED ORDER — CLINDAMYCIN HCL 300 MG PO CAPS: 300.0000 mg | ORAL_CAPSULE | Freq: Three times a day (TID) | ORAL | Status: DC

## 2012-12-02 MED ORDER — OMEPRAZOLE 40 MG PO CPDR: 40.0000 mg | DELAYED_RELEASE_CAPSULE | Freq: Every day | ORAL | Status: DC

## 2012-12-02 MED ORDER — COLLAGENASE 250 UNIT/GM EX OINT: TOPICAL_OINTMENT | Freq: Every day | CUTANEOUS | Status: DC

## 2012-12-02 MED ORDER — PIOGLITAZONE HCL 30 MG PO TABS: 30.0000 mg | ORAL_TABLET | Freq: Every day | ORAL | Status: DC

## 2012-12-02 MED ORDER — HYDROCODONE-ACETAMINOPHEN 5-325 MG PO TABS: 1.0000 | ORAL_TABLET | ORAL | Status: DC | PRN

## 2012-12-02 MED ORDER — PYRIDOXINE HCL 200 MG PO TABS: 200.0000 mg | ORAL_TABLET | Freq: Every day | ORAL | Status: DC

## 2012-12-02 MED ORDER — OMEGA-3 FATTY ACIDS 1000 MG PO CAPS: 2.0000 g | ORAL_CAPSULE | Freq: Every day | ORAL | Status: DC

## 2012-12-02 NOTE — Progress Notes (Signed)
Received wound culture results positive MRSA.  Dr. Brien Few paged and notified.

## 2012-12-02 NOTE — Discharge Summary (Signed)
Physician Discharge Summary  Diane Morgan ZOX:[id number] DOB: 10-13-1970 DOA: 11/28/2012  PCP: Rudi Heap, MD  Admit date: 11/28/2012 Discharge date: 12/02/2012  Time spent: 40 minutes  Recommendations for Outpatient Follow-up:  1. Follow up with primary MD.   Discharge Diagnoses:  Active Problems:   Hypertension   Hyperlipidemia   Type II or unspecified type diabetes mellitus with unspecified complication, uncontrolled   Cellulitis and abscess   GERD (gastroesophageal reflux disease)   Discharge Condition: Satisfactory.   Diet recommendation: Heart-Healthy/Carbohydrate-Modified.   Filed Weights   11/28/12 1230  Weight: 116.484 kg (256 lb 12.8 oz)    History of present illness:  42 y.o. female with history of morbid obesity, type 2 diabetes mellitus with neuropathy, hypertension, dyslipidemia, Eczema, GERD, who presents as a direct admission from her primary care physician's office, for evaluation of cellulitis and abscess of abdominal wall. She reports noting a "bump" in her skin in the left lower quadrant abdominal region 2 weeks ago. Over time, this became erythematous, having localized warmth, indurated and painful. Because of worsening symptoms she presented this past weekend to urgent care, where she was diagnosed with an abdominal wall sebaceous cyst and underwent incision and drainage. She was instructed to followup with her primary care provider early this week. Meanwhile patient had been on Doxycycline and Bactrim therapy. There is concerns about possible doxycycline allergy, for which this was changed to monotherapy with Clindamycin which she tolerated well, taking her first dose on 11/27/12. Over the last several days, she has reported severe localized pain along with extension of erythema and induration. She denies purulent discharge. She also complained of malaise, subjective fevers and chills, as well as decreased urinary output, which has been dark and concentrated.  Patient reporting compliance to antimicrobial therapy. Lab work done at her primary care physician's office today showed a white count of 16,500 with a BUN of 40 and creatinine of 2.33. She was transferred to this facility for further evaluation and treatment.   Hospital Course:  1. Abdominal wall cellulitis/Panniculitis: Patient presented with progressive redness, swelling, induration to region of left lower quadrant, significantly becoming worse in the last several days pre-admission. She was treated initially with oral Doxycycline and Bactrim therapy, later changed to Clindamycin (first dose on 11/27/12). She was afebrile on admission, wcc 16.5. CT Abdomen/Pelvis showed stranding within the subcutaneous soft tissues, most pronounced in the left lower quadrant. No focal fluid collection to suggest abscess. Managed with IV Vancomycin/Aztreonam, with steady and satisfactory clinical improvement, and amelioration of local inflammatory phenomena. Blood cultures remained negative, but wound cultures revealed abundant MRSA. Wcc normalized at 7.3, as of 11/29/12. Aztreonam was discontinued on 11/30/12. Patient will be transitioned to oral Clindamycin, effective 12/02/12, to be concluded on 12/09/12.  2. Acute renal failure: At presentation, creatinine was 2.33, against a known baseline creatinine of 1.13 in 07/2012. Likely, this is due to dehydration, in the setting of ACE-i therapy. Lisinopril was discontinued. Managed with iv fluids, and creatinine normalized at 0.90 as of 12/01/12. IV fluids were discontinued on 11/30/12.  3. Hypertension: Controlled on Norvasc 10 mg by mouth daily.  4. Type 2 diabetes mellitus/Diabetic Neuropathy: Patient was on multiple oral hypoglycemics, pre-admission, and random blood glucose was 223. Managed with diet/SSI initially, and with improvement in renal function, oral hypoglycemics were reinstated on 11/29/12. On Gabapentin for neuropathy. HBA1C is 6.5. 5. Dyslipidemia: On Statin therapy.   6. GERD: Asymptomatic.    Procedures:  See Below.   Consultations:  N/A.  Discharge Exam: Filed Vitals:   12/02/12 0600  BP: 120/68  Pulse: 93  Temp: 97.9 F (36.6 C)  Resp: 18    General: Comfortable, alert, communicative, fully oriented, not short of breath at rest. Not in acute pain.  HEENT: Mild clinical pallor, no jaundice, no conjunctival injection or discharge. Hydration is fair.  NECK: Supple, JVP not seen, no carotid bruits, no palpable lymphadenopathy, no palpable goiter.  CHEST: Clinically clear to auscultation, no wheezes, no crackles.  HEART: Sounds 1 and 2 heard, normal, regular, no murmurs.  ABDOMEN: Morbidly obese, soft, no palpable organomegaly, no palpable masses, normal bowel sounds. Area of erythema/induration in pannus of LLQ has markedly improved. The 2 superficial-appearing wounds ( site of previous I&D), now under occlusive dressing.  GENITALIA: Not examined.  LOWER EXTREMITIES: No pitting edema, palpable peripheral pulses.  MUSCULOSKELETAL SYSTEM: Unremarkable.  CENTRAL NERVOUS SYSTEM: No focal neurologic deficit on gross examination.  Discharge Instructions      Discharge Orders   Future Orders Complete By Expires   Diet - low sodium heart healthy  As directed    Diet Carb Modified  As directed    Increase activity slowly  As directed        Medication List    STOP taking these medications       acetaminophen 500 MG tablet  Commonly known as:  TYLENOL     Alcohol Swabs Pads     Choline Fenofibrate 135 MG capsule     CHROMIUM PICOLINATE PO     doxycycline 100 MG tablet  Commonly known as:  VIBRA-TABS     glucose blood test strip     Prodigy Autocode Blood Glucose Devi     PRODIGY LANCETS 21G Misc     sulfamethoxazole-trimethoprim 800-160 MG per tablet  Commonly known as:  BACTRIM DS     Vitamin D3 2000 UNITS Tabs     WEIGHT LOSS DAILY MULTI PO      TAKE these medications       aspirin 81 MG chewable tablet  Chew  81 mg by mouth daily.     atorvastatin 40 MG tablet  Commonly known as:  LIPITOR  TAKE ONE TABLET BY MOUTH EVERY DAY     clindamycin 300 MG capsule  Commonly known as:  CLEOCIN  Take 1 capsule (300 mg total) by mouth 3 (three) times daily.     collagenase ointment  Commonly known as:  SANTYL  Apply topically daily. Apply to pannus wound daily the most medial lesion     fish oil-omega-3 fatty acids 1000 MG capsule  Take 2 capsules (2 g total) by mouth daily.     gabapentin 300 MG capsule  Commonly known as:  NEURONTIN  Take 1 capsule (300 mg total) by mouth 3 (three) times daily.     glipiZIDE 10 MG tablet  Commonly known as:  GLUCOTROL  Take 1 tablet (10 mg total) by mouth daily.     HYDROcodone-acetaminophen 5-325 MG per tablet  Commonly known as:  NORCO/VICODIN  Take 1 tablet by mouth every 4 (four) hours as needed.     lisinopril 5 MG tablet  Commonly known as:  PRINIVIL,ZESTRIL  Take 1 tablet (5 mg total) by mouth daily.     metFORMIN 1000 MG tablet  Commonly known as:  GLUCOPHAGE  Take 1 tablet (1,000 mg total) by mouth 2 (two) times daily with a meal.     omeprazole 40 MG capsule  Commonly known as:  PRILOSEC  Take 1 capsule (40 mg total) by mouth daily.     pioglitazone 30 MG tablet  Commonly known as:  ACTOS  Take 1 tablet (30 mg total) by mouth daily.     pyridoxine 200 MG tablet  Commonly known as:  B-6  Take 1 tablet (200 mg total) by mouth daily.     saxagliptin HCl 5 MG Tabs tablet  Commonly known as:  ONGLYZA  Take 1 tablet (5 mg total) by mouth daily.       Allergies  Allergen Reactions  . Niaspan [Niacin] Other (See Comments)    Patient passes out.  Marland Kitchen Penicillins Other (See Comments)    Childhood Reaction.   Follow-up Information   Schedule an appointment as soon as possible for a visit with Rudi Heap, MD.   Specialty:  Family Medicine   Contact information:   [address] [place] [address] [phone]        The  results of significant diagnostics from this hospitalization (including imaging, microbiology, ancillary and laboratory) are listed below for reference.    Significant Diagnostic Studies: Ct Abdomen Pelvis Wo Contrast  11/28/2012   CLINICAL DATA:  Question abdominal wall abscess. Left lower quadrant induration.  EXAM: CT ABDOMEN AND PELVIS WITHOUT CONTRAST  TECHNIQUE: Multidetector CT imaging of the abdomen and pelvis was performed following the standard protocol without intravenous contrast.  COMPARISON:  None.  FINDINGS: Lung bases are clear. No effusions. Heart is normal size. There is stranding/ edema throughout the subcutaneous soft tissues of the abdominal and pelvic wall anteriorly and laterally. Within the left lower quadrant, more pronounced stranding is noted without focal fluid collection.  Appendix is visualized and is normal. Prior cholecystectomy. Liver, spleen, pancreas, adrenals and kidneys have an unremarkable unenhanced appearance. Uterus, adnexae and urinary bladder are unremarkable. Stomach, large and small bowel grossly unremarkable.  No acute bony abnormality.  IMPRESSION: Stranding within the subcutaneous soft tissues, most pronounced in the left lower quadrant. No focal fluid collection to suggest abscess. Findings could reflect cellulitis/panniculitis.   Electronically Signed   By: Charlett Nose M.D.   On: 11/28/2012 19:22    Microbiology: Recent Results (from the past 240 hour(s))  WOUND CULTURE     Status: None   Collection Time    11/28/12  2:43 PM      Result Value Range Status   Specimen Description WOUND LEFT ABDOMEN   Final   Special Requests NONE   Final   Gram Stain     Final   Value: NO WBC SEEN     RARE SQUAMOUS EPITHELIAL CELLS PRESENT     RARE GRAM POSITIVE COCCI     IN PAIRS RARE GRAM NEGATIVE RODS     Performed at [facility]   Culture     Final   Value: ABUNDANT METHICILLIN RESISTANT STAPHYLOCOCCUS AUREUS     Note: RIFAMPIN AND GENTAMICIN SHOULD  NOT BE USED AS SINGLE DRUGS FOR TREATMENT OF STAPH INFECTIONS. This organism DOES NOT demonstrate inducible Clindamycin resistance in vitro. CRITICAL RESULT CALLED TO, READ BACK BY AND VERIFIED WITH: BONNIE GIBBS @      12:18PM  12/02/12 BY DWEEKS     Performed at [facility]   Report Status 12/02/2012 FINAL   Final   Organism ID, Bacteria METHICILLIN RESISTANT STAPHYLOCOCCUS AUREUS   Final  MRSA PCR SCREENING     Status: Abnormal   Collection Time    11/28/12  2:44 PM  Result Value Range Status   MRSA by PCR POSITIVE (*) NEGATIVE Final   Comment:            The GeneXpert MRSA Assay (FDA     approved for NASAL specimens     only), is one component of a     comprehensive MRSA colonization     surveillance program. It is not     intended to diagnose MRSA     infection nor to guide or     monitor treatment for     MRSA infections.     RESULT CALLED TO, READ BACK BY AND VERIFIED WITH:     J.Nehemiah Settle (RN) [phone] 11/28/2012 L.LOMAX  CULTURE, BLOOD (ROUTINE X 2)     Status: None   Collection Time    11/28/12  3:18 PM      Result Value Range Status   Specimen Description BLOOD RIGHT FOREARM   Final   Special Requests BOTTLES DRAWN AEROBIC ONLY 3CC   Final   Culture  Setup Time     Final   Value: 11/28/2012 21:47     Performed at [facility]   Culture     Final   Value:        BLOOD CULTURE RECEIVED NO GROWTH TO DATE CULTURE WILL BE HELD FOR 5 DAYS BEFORE ISSUING A FINAL NEGATIVE REPORT     Performed at [facility]   Report Status PENDING   Incomplete  CULTURE, BLOOD (ROUTINE X 2)     Status: None   Collection Time    11/28/12  3:24 PM      Result Value Range Status   Specimen Description BLOOD RIGHT ANTECUBITAL   Final   Special Requests BOTTLES DRAWN AEROBIC ONLY 2CC   Final   Culture  Setup Time     Final   Value: 11/28/2012 21:46     Performed at [facility]   Culture     Final   Value:        BLOOD CULTURE RECEIVED NO GROWTH TO DATE CULTURE  WILL BE HELD FOR 5 DAYS BEFORE ISSUING A FINAL NEGATIVE REPORT     Performed at [facility]   Report Status PENDING   Incomplete     Labs: Basic Metabolic Panel:  Recent Labs Lab 11/28/12 1518 11/29/12 0620 11/30/12 0510 12/01/12 0450 12/02/12 0605  NA 133* 137 135 134* 134*  K 4.2 4.6 4.8 4.5 4.0  CL 102 107 105 103 101  CO2 18* 19 21 22 22   GLUCOSE 119* 200* 179* 197* 174*  BUN 29* 20 12 10 12   CREATININE 1.68* 1.21* 0.90 0.80 0.89  CALCIUM 8.5 8.6 8.4 8.5 8.3*  MG 1.3* 1.2*  --   --   --    Liver Function Tests:  Recent Labs Lab 11/27/12 0931 11/28/12 1518  AST 18 39*  ALT 14 21  ALKPHOS 93 82  BILITOT 0.2 0.2*  PROT 4.8* 5.9*  ALBUMIN  --  1.9*   No results found for this basename: LIPASE, AMYLASE,  in the last 168 hours No results found for this basename: AMMONIA,  in the last 168 hours CBC:  Recent Labs Lab 11/28/12 1518 11/29/12 0620 11/30/12 0510 12/01/12 0450 12/02/12 0605  WBC 9.5 7.3 9.6 8.4 9.9  NEUTROABS 6.8  --   --   --   --   HGB 9.5* 9.4* 9.8* 9.4* 10.0*  HCT 28.3* 28.5* 29.7* 29.0* 31.2*  MCV 73.1*  75.0* 74.3* 75.7* 75.9*  PLT 447* 402* 426* 383 362   Cardiac Enzymes: No results found for this basename: CKTOTAL, CKMB, CKMBINDEX, TROPONINI,  in the last 168 hours BNP: BNP (last 3 results) No results found for this basename: PROBNP,  in the last 8760 hours CBG:  Recent Labs Lab 11/30/12 2128 12/01/12 0751 12/01/12 1156 12/02/12 0809 12/02/12 1159  GLUCAP 146* 183* 120* 168* 103*       Signed:  Ashantae Pangallo,CHRISTOPHER  [facility] 12/02/2012, 12:56 PM

## 2012-12-03 LAB — GLUCOSE, CAPILLARY
Glucose-Capillary: 127 mg/dL — ABNORMAL HIGH (ref 70–99)
Glucose-Capillary: 107 mg/dL — ABNORMAL HIGH (ref 70–99)

## 2012-12-04 ENCOUNTER — Ambulatory Visit: Payer: Medicaid Other | Admitting: Family Medicine

## 2012-12-04 LAB — CULTURE, BLOOD (ROUTINE X 2)

## 2012-12-07 ENCOUNTER — Ambulatory Visit (INDEPENDENT_AMBULATORY_CARE_PROVIDER_SITE_OTHER): Payer: Medicaid Other | Admitting: Family Medicine

## 2012-12-07 ENCOUNTER — Encounter: Payer: Self-pay | Admitting: Family Medicine

## 2012-12-07 VITALS — BP 83/49 | HR 94 | Temp 97.3°F | Ht 62.0 in

## 2012-12-07 DIAGNOSIS — L0291 Cutaneous abscess, unspecified: Secondary | ICD-10-CM

## 2012-12-07 DIAGNOSIS — N179 Acute kidney failure, unspecified: Secondary | ICD-10-CM

## 2012-12-07 DIAGNOSIS — L0390 Cellulitis, unspecified: Secondary | ICD-10-CM

## 2012-12-07 DIAGNOSIS — D72829 Elevated white blood cell count, unspecified: Secondary | ICD-10-CM

## 2012-12-07 LAB — POCT CBC
Granulocyte percent: 66.6 %G (ref 37–80)
HCT, POC: 30.1 % — AB (ref 37.7–47.9)
Hemoglobin: 10 g/dL — AB (ref 12.2–16.2)
Lymph, poc: 3.8 — AB (ref 0.6–3.4)
MCH, POC: 24.5 pg — AB (ref 27–31.2)
MCHC: 33.3 g/dL (ref 31.8–35.4)
MCV: 73.5 fL — AB (ref 80–97)
MPV: 8.2 fL (ref 0–99.8)
POC Granulocyte: 9.9 — AB (ref 2–6.9)
POC LYMPH PERCENT: 25.7 %L (ref 10–50)
Platelet Count, POC: 388 10*3/uL (ref 142–424)
RBC: 4.1 M/uL (ref 4.04–5.48)
RDW, POC: 16.7 %
WBC: 14.9 10*3/uL — AB (ref 4.6–10.2)

## 2012-12-07 NOTE — Patient Instructions (Signed)
Cellulitis Cellulitis is an infection of the skin and the tissue beneath it. The infected area is usually red and tender. Cellulitis occurs most often in the arms and lower legs.  CAUSES  Cellulitis is caused by bacteria that enter the skin through cracks or cuts in the skin. The most common types of bacteria that cause cellulitis are Staphylococcus and Streptococcus. SYMPTOMS   Redness and warmth.  Swelling.  Tenderness or pain.  Fever. DIAGNOSIS  Your caregiver can usually determine what is wrong based on a physical exam. Blood tests may also be done. TREATMENT  Treatment usually involves taking an antibiotic medicine. HOME CARE INSTRUCTIONS   Take your antibiotics as directed. Finish them even if you start to feel better.  Keep the infected arm or leg elevated to reduce swelling.  Apply a warm cloth to the affected area up to 4 times per day to relieve pain.  Only take over-the-counter or prescription medicines for pain, discomfort, or fever as directed by your caregiver.  Keep all follow-up appointments as directed by your caregiver. SEEK MEDICAL CARE IF:   You notice red streaks coming from the infected area.  Your red area gets larger or turns dark in color.  Your bone or joint underneath the infected area becomes painful after the skin has healed.  Your infection returns in the same area or another area.  You notice a swollen bump in the infected area.  You develop new symptoms. SEEK IMMEDIATE MEDICAL CARE IF:   You have a fever.  You feel very sleepy.  You develop vomiting or diarrhea.  You have a general ill feeling (malaise) with muscle aches and pains. MAKE SURE YOU:   Understand these instructions.  Will watch your condition.  Will get help right away if you are not doing well or get worse. Document Released: 12/15/2004 Document Revised: 09/06/2011 Document Reviewed: 05/23/2011 [employer] Patient Information 2014 [employer].  

## 2012-12-07 NOTE — Progress Notes (Signed)
  Subjective:    Patient ID: Diane Morgan, female    DOB: 12-Apr-1970, 42 y.o.   MRN: [id number]  HPI This 42 y.o. female presents for evaluation of Hospital follow up.  She has cellulitis Of the left lower quadrant that showed MRSA and she was tx'd with IV antibiotics  And IV rehydration.  She had labs showing leukocytosis and ARF.  Review of Systems C/o cellulitis LLQ No chest pain, SOB, HA, dizziness, vision change, N/V, diarrhea, constipation, dysuria, urinary urgency or frequency, myalgias, arthralgias or rash.     Objective:   Physical Exam  Vital signs noted  Well developed well nourished female.  HEENT - Head atraumatic Normocephalic                Eyes - PERRLA, Conjuctiva - clear Sclera- Clear EOMI                Ears - EAC's Wnl TM's Wnl Gross Hearing WNL                Nose - Nares patent                 Throat - oropharanx wnl Respiratory - Lungs CTA bilateral Cardiac - RRR S1 and S2 without murmur Skin - Cellulitis LLQ and dressing with serous drainage      Assessment & Plan:  Cellulitis - Plan: POCT CBC, BMP8+EGFR, continue clindamycin 300mg  po tid x 7days Follow up in one week.  Acute renal failure - bmp  Leukocytosis, unspecified - repeat cbc

## 2012-12-08 LAB — BMP8+EGFR
BUN/Creatinine Ratio: 9 (ref 9–23)
BUN: 8 mg/dL (ref 6–24)
CO2: 22 mmol/L (ref 18–29)
Calcium: 8.4 mg/dL — ABNORMAL LOW (ref 8.7–10.2)
Chloride: 104 mmol/L (ref 97–108)
Creatinine, Ser: 0.93 mg/dL (ref 0.57–1.00)
GFR calc Af Amer: 89 mL/min/{1.73_m2} (ref >59)
GFR calc non Af Amer: 77 mL/min/{1.73_m2} (ref >59)
Glucose: 55 mg/dL — ABNORMAL LOW (ref 65–99)
Potassium: 4.4 mmol/L (ref 3.5–5.2)
Sodium: 142 mmol/L (ref 134–144)

## 2012-12-10 ENCOUNTER — Telehealth: Payer: Self-pay | Admitting: *Deleted

## 2012-12-10 NOTE — Telephone Encounter (Signed)
Pt already notified of results

## 2012-12-10 NOTE — Telephone Encounter (Signed)
Message copied by Bearl Mulberry on Mon Dec 10, 2012  1:26 PM ------      Message from: Deatra Canter      Created: Sat Dec 08, 2012  8:14 AM       Patient has cellulitis and has started on clindamycin.  Have her follow up early next week.  Need anemia w/u labs at follow up and repeat cbc. ------

## 2012-12-12 ENCOUNTER — Ambulatory Visit (INDEPENDENT_AMBULATORY_CARE_PROVIDER_SITE_OTHER): Payer: Medicaid Other | Admitting: Family Medicine

## 2012-12-12 ENCOUNTER — Encounter: Payer: Self-pay | Admitting: Family Medicine

## 2012-12-12 VITALS — BP 98/56 | HR 88 | Temp 98.2°F | Ht 62.0 in | Wt 268.0 lb

## 2012-12-12 DIAGNOSIS — L0390 Cellulitis, unspecified: Secondary | ICD-10-CM

## 2012-12-12 DIAGNOSIS — D649 Anemia, unspecified: Secondary | ICD-10-CM

## 2012-12-12 DIAGNOSIS — L0291 Cutaneous abscess, unspecified: Secondary | ICD-10-CM

## 2012-12-12 MED ORDER — CLINDAMYCIN HCL 300 MG PO CAPS: 300.0000 mg | ORAL_CAPSULE | Freq: Three times a day (TID) | ORAL | Status: DC

## 2012-12-12 NOTE — Patient Instructions (Addendum)
Cellulitis Cellulitis is an infection of the skin and the tissue beneath it. The infected area is usually red and tender. Cellulitis occurs most often in the arms and lower legs.  CAUSES  Cellulitis is caused by bacteria that enter the skin through cracks or cuts in the skin. The most common types of bacteria that cause cellulitis are Staphylococcus and Streptococcus. SYMPTOMS   Redness and warmth.  Swelling.  Tenderness or pain.  Fever. DIAGNOSIS  Your caregiver can usually determine what is wrong based on a physical exam. Blood tests may also be done. TREATMENT  Treatment usually involves taking an antibiotic medicine. HOME CARE INSTRUCTIONS   Take your antibiotics as directed. Finish them even if you start to feel better.  Keep the infected arm or leg elevated to reduce swelling.  Apply a warm cloth to the affected area up to 4 times per day to relieve pain.  Only take over-the-counter or prescription medicines for pain, discomfort, or fever as directed by your caregiver.  Keep all follow-up appointments as directed by your caregiver. SEEK MEDICAL CARE IF:   You notice red streaks coming from the infected area.  Your red area gets larger or turns dark in color.  Your bone or joint underneath the infected area becomes painful after the skin has healed.  Your infection returns in the same area or another area.  You notice a swollen bump in the infected area.  You develop new symptoms. SEEK IMMEDIATE MEDICAL CARE IF:   You have a fever.  You feel very sleepy.  You develop vomiting or diarrhea.  You have a general ill feeling (malaise) with muscle aches and pains. MAKE SURE YOU:   Understand these instructions.  Will watch your condition.  Will get help right away if you are not doing well or get worse. Document Released: 12/15/2004 Document Revised: 09/06/2011 Document Reviewed: 05/23/2011 Exit[employer] Patient Information 2014 [employer].  

## 2012-12-12 NOTE — Progress Notes (Signed)
  Subjective:    Patient ID: Diane Morgan, female    DOB: [birth year], 42 y.o.   MRN: [id number]  HPI This 42 y.o. female presents for evaluation of follow up on her anemia and Cellulitis.  She is having less discharge and healing of her wound LLQ.  She Has heavy menses.   Review of Systems C/o cellulitis No chest pain, SOB, HA, dizziness, vision change, N/V, diarrhea, constipation, dysuria, urinary urgency or frequency, myalgias, arthralgias or rash.     Objective:   Physical Exam Vital signs noted  Well developed well nourished female.  HEENT - Head atraumatic Normocephalic                Eyes - PERRLA, Conjuctiva - clear Sclera- Clear EOMI                Throat - oropharanx wnl Respiratory - Lungs CTA bilateral Cardiac - RRR S1 and S2 without murmur GI - Abdomen obese with cellulitis LLQ with 2 white areas from S/P incision and drainage Without DC, adaptic dressing applied with 2 4x4's and then taped secure       Assessment & Plan:  Anemia - Plan: Anemia Profile B Discussed taking beef liver for iron source and take iron otc  Cellulitis - Plan: clindamycin (CLEOCIN) 300 MG capsule tid x 10 days and follow up if  Worsens.  Discussed with patient and mother that this is healing and she is doing better.  Deatra Canter FNP

## 2012-12-13 ENCOUNTER — Other Ambulatory Visit: Payer: Self-pay | Admitting: Family Medicine

## 2012-12-13 ENCOUNTER — Telehealth: Payer: Self-pay | Admitting: Family Medicine

## 2012-12-13 DIAGNOSIS — D649 Anemia, unspecified: Secondary | ICD-10-CM

## 2012-12-13 LAB — ANEMIA PROFILE B
Basophils Absolute: 0.1 10*3/uL (ref 0.0–0.2)
Basos: 1 %
Eos: 1 %
Eosinophils Absolute: 0.1 10*3/uL (ref 0.0–0.4)
Ferritin: 79 ng/mL (ref 15–150)
Folate: 11.8 ng/mL (ref >3.0)
HCT: 31.1 % — ABNORMAL LOW (ref 34.0–46.6)
Hemoglobin: 10 g/dL — ABNORMAL LOW (ref 11.1–15.9)
Immature Grans (Abs): 0 10*3/uL (ref 0.0–0.1)
Immature Granulocytes: 0 %
Iron Saturation: 12 % — ABNORMAL LOW (ref 15–55)
Iron: 39 ug/dL (ref 35–155)
Lymphocytes Absolute: 2 10*3/uL (ref 0.7–3.1)
Lymphs: 29 %
MCH: 24.9 pg — ABNORMAL LOW (ref 26.6–33.0)
MCHC: 32.2 g/dL (ref 31.5–35.7)
MCV: 78 fL — ABNORMAL LOW (ref 79–97)
Monocytes Absolute: 0.5 10*3/uL (ref 0.1–0.9)
Monocytes: 8 %
Neutrophils Absolute: 4.1 10*3/uL (ref 1.4–7.0)
Neutrophils Relative %: 61 %
Platelets: 348 10*3/uL (ref 150–379)
RBC: 4.01 x10E6/uL (ref 3.77–5.28)
RDW: 17.7 % — ABNORMAL HIGH (ref 12.3–15.4)
Retic Ct Pct: 3.1 % — ABNORMAL HIGH (ref 0.6–2.6)
TIBC: 313 ug/dL (ref 250–450)
UIBC: 274 ug/dL (ref 150–375)
Vitamin B-12: 823 pg/mL (ref 211–946)
WBC: 6.7 10*3/uL (ref 3.4–10.8)

## 2012-12-13 MED ORDER — FERROUS GLUCONATE 325 MG PO TABS: 325.0000 mg | ORAL_TABLET | Freq: Every day | ORAL | Status: DC

## 2012-12-14 ENCOUNTER — Other Ambulatory Visit: Payer: Self-pay | Admitting: Family Medicine

## 2012-12-14 ENCOUNTER — Ambulatory Visit: Payer: Medicaid Other | Admitting: Family Medicine

## 2012-12-14 DIAGNOSIS — L0390 Cellulitis, unspecified: Secondary | ICD-10-CM

## 2012-12-14 MED ORDER — CLINDAMYCIN HCL 300 MG PO CAPS: 300.0000 mg | ORAL_CAPSULE | Freq: Three times a day (TID) | ORAL | Status: AC

## 2012-12-21 ENCOUNTER — Telehealth: Payer: Self-pay | Admitting: Family Medicine

## 2012-12-21 NOTE — Telephone Encounter (Signed)
THIS HAS BEEN TAKEN CARE OF PER PT. RWS

## 2012-12-24 ENCOUNTER — Telehealth: Payer: Self-pay | Admitting: *Deleted

## 2012-12-24 NOTE — Telephone Encounter (Signed)
Pt's mom notified that pt should finish antibiotic given by hospital Also should start Fergon Pt's mom verbalizes understanding

## 2013-01-02 ENCOUNTER — Encounter: Payer: Self-pay | Admitting: Family Medicine

## 2013-01-02 ENCOUNTER — Ambulatory Visit (INDEPENDENT_AMBULATORY_CARE_PROVIDER_SITE_OTHER): Payer: Medicaid Other | Admitting: Family Medicine

## 2013-01-02 VITALS — BP 113/66 | HR 88 | Temp 97.3°F | Ht 62.0 in | Wt 264.8 lb

## 2013-01-02 DIAGNOSIS — L0291 Cutaneous abscess, unspecified: Secondary | ICD-10-CM

## 2013-01-02 DIAGNOSIS — L0390 Cellulitis, unspecified: Secondary | ICD-10-CM

## 2013-01-02 NOTE — Progress Notes (Signed)
  Subjective:    Patient ID: Diane Morgan, female    DOB: 17-May-1970, 42 y.o.   MRN: [id number]  HPI This 42 y.o. female presents for evaluation of wound left lower quadrant.  Her cellulitis has healed But she still has a couple areas that are not healed up.   Review of Systems No chest pain, SOB, HA, dizziness, vision change, N/V, diarrhea, constipation, dysuria, urinary urgency or frequency, myalgias, arthralgias or rash.     Objective:   Physical Exam Vital signs noted  Well developed well nourished female.  HEENT - Head atraumatic Normocephalic                Eyes - PERRLA, Conjuctiva - clear Sclera- Clear EOMI                Ears - EAC's Wnl TM's Wnl Gross Hearing WNL                Nose - Nares patent                 Throat - oropharanx wnl Respiratory - Lungs CTA bilateral Cardiac - RRR S1 and S2 without murmur Skin - Left lower abdomen with 2 small open areas with some small amt. Of serous drainage.  Wet to dry dressing applied to left lower abdomen.       Assessment & Plan:  Cellulitis Wet to Dry Dressing to left lower abdominal wound daily and rx for saline and 4x4's given. Follow up if not healing.  Deatra Canter FNP

## 2013-01-02 NOTE — Patient Instructions (Signed)
Cellulitis Cellulitis is an infection of the skin and the tissue beneath it. The infected area is usually red and tender. Cellulitis occurs most often in the arms and lower legs.  CAUSES  Cellulitis is caused by bacteria that enter the skin through cracks or cuts in the skin. The most common types of bacteria that cause cellulitis are Staphylococcus and Streptococcus. SYMPTOMS   Redness and warmth.  Swelling.  Tenderness or pain.  Fever. DIAGNOSIS  Your caregiver can usually determine what is wrong based on a physical exam. Blood tests may also be done. TREATMENT  Treatment usually involves taking an antibiotic medicine. HOME CARE INSTRUCTIONS   Take your antibiotics as directed. Finish them even if you start to feel better.  Keep the infected arm or leg elevated to reduce swelling.  Apply a warm cloth to the affected area up to 4 times per day to relieve pain.  Only take over-the-counter or prescription medicines for pain, discomfort, or fever as directed by your caregiver.  Keep all follow-up appointments as directed by your caregiver. SEEK MEDICAL CARE IF:   You notice red streaks coming from the infected area.  Your red area gets larger or turns dark in color.  Your bone or joint underneath the infected area becomes painful after the skin has healed.  Your infection returns in the same area or another area.  You notice a swollen bump in the infected area.  You develop new symptoms. SEEK IMMEDIATE MEDICAL CARE IF:   You have a fever.  You feel very sleepy.  You develop vomiting or diarrhea.  You have a general ill feeling (malaise) with muscle aches and pains. MAKE SURE YOU:   Understand these instructions.  Will watch your condition.  Will get help right away if you are not doing well or get worse. Document Released: 12/15/2004 Document Revised: 09/06/2011 Document Reviewed: 05/23/2011 [employer] Patient Information 2014 [employer].  

## 2013-01-08 ENCOUNTER — Other Ambulatory Visit: Payer: Self-pay

## 2013-01-08 ENCOUNTER — Encounter: Payer: Self-pay | Admitting: Family Medicine

## 2013-01-08 ENCOUNTER — Ambulatory Visit (INDEPENDENT_AMBULATORY_CARE_PROVIDER_SITE_OTHER): Payer: Medicaid Other | Admitting: Family Medicine

## 2013-01-08 VITALS — BP 120/67 | HR 73 | Temp 97.8°F | Ht 61.0 in | Wt 266.6 lb

## 2013-01-08 DIAGNOSIS — L0390 Cellulitis, unspecified: Secondary | ICD-10-CM

## 2013-01-08 DIAGNOSIS — L0291 Cutaneous abscess, unspecified: Secondary | ICD-10-CM

## 2013-01-08 MED ORDER — SAXAGLIPTIN HCL 5 MG PO TABS: 5.0000 mg | ORAL_TABLET | Freq: Every day | ORAL | Status: DC

## 2013-01-08 MED ORDER — SULFAMETHOXAZOLE-TMP DS 800-160 MG PO TABS: 1.0000 | ORAL_TABLET | Freq: Two times a day (BID) | ORAL | Status: DC

## 2013-01-08 MED ORDER — PIOGLITAZONE HCL 30 MG PO TABS: 30.0000 mg | ORAL_TABLET | Freq: Every day | ORAL | Status: DC

## 2013-01-09 NOTE — Patient Instructions (Signed)
Cellulitis Cellulitis is an infection of the skin and the tissue beneath it. The infected area is usually red and tender. Cellulitis occurs most often in the arms and lower legs.  CAUSES  Cellulitis is caused by bacteria that enter the skin through cracks or cuts in the skin. The most common types of bacteria that cause cellulitis are Staphylococcus and Streptococcus. SYMPTOMS   Redness and warmth.  Swelling.  Tenderness or pain.  Fever. DIAGNOSIS  Your caregiver can usually determine what is wrong based on a physical exam. Blood tests may also be done. TREATMENT  Treatment usually involves taking an antibiotic medicine. HOME CARE INSTRUCTIONS   Take your antibiotics as directed. Finish them even if you start to feel better.  Keep the infected arm or leg elevated to reduce swelling.  Apply a warm cloth to the affected area up to 4 times per day to relieve pain.  Only take over-the-counter or prescription medicines for pain, discomfort, or fever as directed by your caregiver.  Keep all follow-up appointments as directed by your caregiver. SEEK MEDICAL CARE IF:   You notice red streaks coming from the infected area.  Your red area gets larger or turns dark in color.  Your bone or joint underneath the infected area becomes painful after the skin has healed.  Your infection returns in the same area or another area.  You notice a swollen bump in the infected area.  You develop new symptoms. SEEK IMMEDIATE MEDICAL CARE IF:   You have a fever.  You feel very sleepy.  You develop vomiting or diarrhea.  You have a general ill feeling (malaise) with muscle aches and pains. MAKE SURE YOU:   Understand these instructions.  Will watch your condition.  Will get help right away if you are not doing well or get worse. Document Released: 12/15/2004 Document Revised: 09/06/2011 Document Reviewed: 05/23/2011 [employer] Patient Information 2014 [employer].  

## 2013-01-09 NOTE — Progress Notes (Signed)
  Subjective:    Patient ID: Diane Morgan, female    DOB: June 22, 1970, 42 y.o.   MRN: [id number]  HPI This 42 y.o. female presents for evaluation of erythema and furunculosis on her abdomen.  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  She healed and now she is developing some sore on the right abdomen.   Review of Systems C/o cellulitis right abdomen No chest pain, SOB, HA, dizziness, vision change, N/V, diarrhea, constipation, dysuria, urinary urgency or frequency, myalgias, arthralgias or rash.     Objective:   Physical Exam  Vital signs noted  Well developed well nourished female.  HEENT - Head atraumatic Normocephalic                Eyes - PERRLA, Conjuctiva - clear Sclera- Clear EOMI                Ears - EAC's Wnl TM's Wnl Gross Hearing WNL                Nose - Nares patent                 Throat - oropharanx wnl Respiratory - Lungs CTA bilateral Cardiac - RRR S1 and S2 without murmur GI - Abdomen soft Nontender and bowel sounds active x 4 Skin - Cellulitis right abdomen.  Area of erythema approx 3cm right abdomen      Assessment & Plan:  Cellulitis - Plan: sulfamethoxazole-trimethoprim (BACTRIM DS) 800-160 MG per tablet Follow up in 3 days  Deatra Canter FNP

## 2013-01-11 ENCOUNTER — Encounter: Payer: Self-pay | Admitting: Family Medicine

## 2013-01-11 ENCOUNTER — Ambulatory Visit (INDEPENDENT_AMBULATORY_CARE_PROVIDER_SITE_OTHER): Payer: Medicaid Other | Admitting: Family Medicine

## 2013-01-11 ENCOUNTER — Ambulatory Visit: Payer: Medicaid Other | Admitting: Family Medicine

## 2013-01-11 VITALS — BP 93/53 | HR 84 | Temp 97.6°F | Ht 61.0 in | Wt 268.2 lb

## 2013-01-11 DIAGNOSIS — E119 Type 2 diabetes mellitus without complications: Secondary | ICD-10-CM

## 2013-01-11 MED ORDER — PIOGLITAZONE HCL 30 MG PO TABS: 30.0000 mg | ORAL_TABLET | Freq: Every day | ORAL | Status: DC

## 2013-01-11 MED ORDER — GLIPIZIDE 10 MG PO TABS: 10.0000 mg | ORAL_TABLET | Freq: Every day | ORAL | Status: DC

## 2013-01-11 NOTE — Progress Notes (Signed)
  Subjective:    Patient ID: Diane Morgan, female    DOB: 06-30-1970, 42 y.o.   MRN: [id number]  HPI This 42 y.o. female presents for evaluation of cellulitis on her abdomen and She has been on abx's for the last few days..   Review of Systems No chest pain, SOB, HA, dizziness, vision change, N/V, diarrhea, constipation, dysuria, urinary urgency or frequency, myalgias, arthralgias or rash.     Objective:   Physical Exam Vital signs noted  Well developed well nourished female.  HEENT - Head atraumatic Normocephalic                Eyes - PERRLA, Conjuctiva - clear Sclera- Clear EOMI                Ears - EAC's Wnl TM's Wnl Gross Hearing WNL                Nose - Nares patent                 Throat - oropharanx wnl Respiratory - Lungs CTA bilateral Cardiac - RRR S1 and S2 without murmur GI - Abdomen soft Nontender and bowel sounds active x 4 Extremities - No edema. Neuro - Grossly intact. Skin - right abdomen with open area about size of pea with erythema surrounding.       Assessment & Plan:  Celluliits - Resolving and continue current abx's and follow up prn.  Deatra Canter FNP

## 2013-01-11 NOTE — Patient Instructions (Signed)
Cellulitis Cellulitis is an infection of the skin and the tissue beneath it. The infected area is usually red and tender. Cellulitis occurs most often in the arms and lower legs.  CAUSES  Cellulitis is caused by bacteria that enter the skin through cracks or cuts in the skin. The most common types of bacteria that cause cellulitis are Staphylococcus and Streptococcus. SYMPTOMS   Redness and warmth.  Swelling.  Tenderness or pain.  Fever. DIAGNOSIS  Your caregiver can usually determine what is wrong based on a physical exam. Blood tests may also be done. TREATMENT  Treatment usually involves taking an antibiotic medicine. HOME CARE INSTRUCTIONS   Take your antibiotics as directed. Finish them even if you start to feel better.  Keep the infected arm or leg elevated to reduce swelling.  Apply a warm cloth to the affected area up to 4 times per day to relieve pain.  Only take over-the-counter or prescription medicines for pain, discomfort, or fever as directed by your caregiver.  Keep all follow-up appointments as directed by your caregiver. SEEK MEDICAL CARE IF:   You notice red streaks coming from the infected area.  Your red area gets larger or turns dark in color.  Your bone or joint underneath the infected area becomes painful after the skin has healed.  Your infection returns in the same area or another area.  You notice a swollen bump in the infected area.  You develop new symptoms. SEEK IMMEDIATE MEDICAL CARE IF:   You have a fever.  You feel very sleepy.  You develop vomiting or diarrhea.  You have a general ill feeling (malaise) with muscle aches and pains. MAKE SURE YOU:   Understand these instructions.  Will watch your condition.  Will get help right away if you are not doing well or get worse. Document Released: 12/15/2004 Document Revised: 09/06/2011 Document Reviewed: 05/23/2011 [employer] Patient Information 2014 [employer].  

## 2013-01-17 ENCOUNTER — Other Ambulatory Visit: Payer: Self-pay | Admitting: Physician Assistant

## 2013-02-22 ENCOUNTER — Other Ambulatory Visit: Payer: Self-pay | Admitting: Family Medicine

## 2013-03-02 ENCOUNTER — Ambulatory Visit (INDEPENDENT_AMBULATORY_CARE_PROVIDER_SITE_OTHER): Payer: Medicaid Other | Admitting: Family Medicine

## 2013-03-02 VITALS — BP 116/73 | HR 84 | Temp 97.1°F | Ht 61.0 in | Wt 268.0 lb

## 2013-03-02 DIAGNOSIS — L0291 Cutaneous abscess, unspecified: Secondary | ICD-10-CM

## 2013-03-02 DIAGNOSIS — L0390 Cellulitis, unspecified: Secondary | ICD-10-CM

## 2013-03-02 MED ORDER — SULFAMETHOXAZOLE-TMP DS 800-160 MG PO TABS: 1.0000 | ORAL_TABLET | Freq: Two times a day (BID) | ORAL | Status: DC

## 2013-03-02 NOTE — Progress Notes (Signed)
   Subjective:    Patient ID: Diane Morgan, female    DOB: Jan 16, 1971, 42 y.o.   MRN: [id number]  HPI This 42 y.o. female presents for evaluation of right abdominal cyst and cellulitis. She has hx of MRSA and she gets furunculosis..   Review of Systems C/o right abdominal cyst No chest pain, SOB, HA, dizziness, vision change, N/V, diarrhea, constipation, dysuria, urinary urgency or frequency, myalgias, arthralgias or rash.     Objective:   Physical Exam  Vital signs noted  Well developed well nourished obese female.  HEENT - Head atraumatic Normocephalic Respiratory - Lungs CTA bilateral Cardiac - RRR S1 and S2 without murmur Derm - Right abdomen with cyst and surrounding erythema, no drainage.      Assessment & Plan:  Cellulitis - Plan: sulfamethoxazole-trimethoprim (BACTRIM DS) 800-160 MG per tablet Po bid x 10 days #20.  Deatra Canter FNP

## 2013-03-02 NOTE — Patient Instructions (Signed)
Cellulitis Cellulitis is an infection of the skin and the tissue beneath it. The infected area is usually red and tender. Cellulitis occurs most often in the arms and lower legs.  CAUSES  Cellulitis is caused by bacteria that enter the skin through cracks or cuts in the skin. The most common types of bacteria that cause cellulitis are Staphylococcus and Streptococcus. SYMPTOMS   Redness and warmth.  Swelling.  Tenderness or pain.  Fever. DIAGNOSIS  Your caregiver can usually determine what is wrong based on a physical exam. Blood tests may also be done. TREATMENT  Treatment usually involves taking an antibiotic medicine. HOME CARE INSTRUCTIONS   Take your antibiotics as directed. Finish them even if you start to feel better.  Keep the infected arm or leg elevated to reduce swelling.  Apply a warm cloth to the affected area up to 4 times per day to relieve pain.  Only take over-the-counter or prescription medicines for pain, discomfort, or fever as directed by your caregiver.  Keep all follow-up appointments as directed by your caregiver. SEEK MEDICAL CARE IF:   You notice red streaks coming from the infected area.  Your red area gets larger or turns dark in color.  Your bone or joint underneath the infected area becomes painful after the skin has healed.  Your infection returns in the same area or another area.  You notice a swollen bump in the infected area.  You develop new symptoms. SEEK IMMEDIATE MEDICAL CARE IF:   You have a fever.  You feel very sleepy.  You develop vomiting or diarrhea.  You have a general ill feeling (malaise) with muscle aches and pains. MAKE SURE YOU:   Understand these instructions.  Will watch your condition.  Will get help right away if you are not doing well or get worse. Document Released: 12/15/2004 Document Revised: 09/06/2011 Document Reviewed: 05/23/2011 [employer] Patient Information 2014 [employer].  

## 2013-03-15 ENCOUNTER — Ambulatory Visit: Payer: Medicaid Other | Admitting: Family Medicine

## 2013-03-19 ENCOUNTER — Ambulatory Visit (INDEPENDENT_AMBULATORY_CARE_PROVIDER_SITE_OTHER): Payer: Medicaid Other | Admitting: Family Medicine

## 2013-03-19 ENCOUNTER — Encounter: Payer: Self-pay | Admitting: Family Medicine

## 2013-03-19 VITALS — BP 104/65 | HR 90 | Temp 98.3°F | Ht 61.0 in | Wt 270.0 lb

## 2013-03-19 DIAGNOSIS — Z23 Encounter for immunization: Secondary | ICD-10-CM

## 2013-03-19 DIAGNOSIS — E119 Type 2 diabetes mellitus without complications: Secondary | ICD-10-CM

## 2013-03-19 LAB — POCT GLYCOSYLATED HEMOGLOBIN (HGB A1C): Hemoglobin A1C: 6.3

## 2013-03-19 NOTE — Patient Instructions (Addendum)
Tetanus, Diphtheria (Td) Vaccine What You Need to Know WHY GET VACCINATED? Tetanus  and diphtheria are very serious diseases. They are rare in the [place] today, but people who do become infected often have severe complications. Td vaccine is used to protect adolescents and adults from both of these diseases. Both tetanus and diphtheria are infections caused by bacteria. Diphtheria spreads from person to person through coughing or sneezing. Tetanus-causing bacteria enter the body through cuts, scratches, or wounds. TETANUS (Lockjaw) causes painful muscle tightening and stiffness, usually all over the body.  It can lead to tightening of muscles in the head and neck so you can't open your mouth, swallow, or sometimes even breathe. Tetanus kills about 1 out of every 5 people who are infected. DIPHTHERIA can cause a thick coating to form in the back of the throat.  It can lead to breathing problems, paralysis, heart failure, and death. Before vaccines, the [place] saw as many as 200,000 cases a year of diphtheria and hundreds of cases of tetanus. Since vaccination began, cases of both diseases have dropped by about 99%. TD VACCINE Td vaccine can protect adolescents and adults from tetanus and diphtheria. Td is usually given as a booster dose every 10 years but it can also be given earlier after a severe and dirty wound or burn. Your doctor can give you more information. Td may safely be given at the same time as other vaccines. SOME PEOPLE SHOULD NOT GET THIS VACCINE  If you ever had a life-threatening allergic reaction after a dose of any tetanus or diphtheria containing vaccine, OR if you have a severe allergy to any part of this vaccine, you should not get Td. Tell your doctor if you have any severe allergies.  Talk to your doctor if you:  have epilepsy or another nervous system problem,  had severe pain or swelling after any vaccine containing diphtheria or tetanus,  ever had  Guillain Barr Syndrome (GBS),  aren't feeling well on the day the shot is scheduled. RISKS OF A VACCINE REACTION With a vaccine, like any medicine, there is a chance of side effects. These are usually mild and go away on their own. Serious side effects are also possible, but are very rare. Most people who get Td vaccine do not have any problems with it. Mild Problems  following Td (Did not interfere with activities)  Pain where the shot was given (about 8 people in 10)  Redness or swelling where the shot was given (about 1 person in 3)  Mild fever (about 1 person in 15)  Headache or Tiredness (uncommon) Moderate Problems following Td (Interfered with activities, but did not require medical attention)  Fever over 102 F (38.9 C) (rare) Severe Problems  following Td (Unable to perform usual activities; required medical attention)  Swelling, severe pain, bleeding, or redness in the arm where the shot was given (rare). Problems that could happen after any vaccine:  Brief fainting spells can happen after any medical procedure, including vaccination. Sitting or lying down for about 15 minutes can help prevent fainting, and injuries caused by a fall. Tell your doctor if you feel dizzy, or have vision changes or ringing in the ears.  Severe shoulder pain and reduced range of motion in the arm where a shot was given can happen, very rarely, after a vaccination.  Severe allergic reactions from a vaccine are very rare, estimated at less than 1 in a million doses. If one were to occur, it would   usually be within a few minutes to a few hours after the vaccination. WHAT IF THERE IS A SERIOUS REACTION? What should I look for?  Look for anything that concerns you, such as signs of a severe allergic reaction, very high fever, or behavior changes. Signs of a severe allergic reaction can include hives, swelling of the face and throat, difficulty breathing, a fast heartbeat, dizziness, and  weakness. These would usually start a few minutes to a few hours after the vaccination. What should I do?  If you think it is a severe allergic reaction or other emergency that can't wait, call 911 or get the person to the nearest hospital. Otherwise, call your doctor.  Afterward, the reaction should be reported to the Vaccine Adverse Event Reporting System (VAERS). Your doctor might file this report, or, you can do it yourself through the VAERS website or by calling 1-918-561-7995. VAERS is only for reporting reactions. They do not give medical advice. THE NATIONAL VACCINE INJURY COMPENSATION PROGRAM The National Vaccine Injury Compensation Program (VICP) is a federal program that was created to compensate people who may have been injured by certain vaccines. Persons who believe they may have been injured by a vaccine can learn about the program and about filing a claim by calling 1-470-415-5838 or visiting the [facility] website. HOW CAN I LEARN MORE?  Ask your doctor.  Contact your local or state health department.  Contact the Centers for Disease Control and Prevention (CDC):  Call 858-879-5692 (1-800-CDC-INFO)  Visit CDC's vaccines website CDC Td Vaccine Interim VIS (04/24/12) Document Released: 01/02/2006 Document Revised: 07/02/2012 Document Reviewed: 06/27/2012 [facility] Patient Information 2014 [place], [place]. Diabetes and Exercise Exercising regularly is important. It is not just about losing weight. It has many health benefits, such as:  Improving your overall fitness, flexibility, and endurance.  Increasing your bone density.  Helping with weight control.  Decreasing your body fat.  Increasing your muscle strength.  Reducing stress and tension.  Improving your overall health. People with diabetes who exercise gain additional benefits because exercise:  Reduces appetite.  Improves the body's use of blood sugar (glucose).  Helps lower or control blood  glucose.  Decreases blood pressure.  Helps control blood lipids (such as cholesterol and triglycerides).  Improves the body's use of the hormone insulin by:  Increasing the body's insulin sensitivity.  Reducing the body's insulin needs.  Decreases the risk for heart disease because exercising:  Lowers cholesterol and triglycerides levels.  Increases the levels of good cholesterol (such as high-density lipoproteins [HDL]) in the body.  Lowers blood glucose levels. YOUR ACTIVITY PLAN  Choose an activity that you enjoy and set realistic goals. Your health care provider or diabetes educator can help you make an activity plan that works for you. You can break activities into 2 or 3 sessions throughout the day. Doing so is as good as one long session. Exercise ideas include:  Taking the dog for a walk.  Taking the stairs instead of the elevator.  Dancing to your favorite song.  Doing your favorite exercise with a friend. RECOMMENDATIONS FOR EXERCISING WITH TYPE 1 OR TYPE 2 DIABETES   Check your blood glucose before exercising. If blood glucose levels are greater than 240 mg/dL, check for urine ketones. Do not exercise if ketones are present.  Avoid injecting insulin into areas of the body that are going to be exercised. For example, avoid injecting insulin into:  The arms when playing tennis.  The legs when jogging.  Keep a  record of:  Food intake before and after you exercise.  Expected peak times of insulin action.  Blood glucose levels before and after you exercise.  The type and amount of exercise you have done.  Review your records with your health care provider. Your health care provider will help you to develop guidelines for adjusting food intake and insulin amounts before and after exercising.  If you take insulin or oral hypoglycemic agents, watch for signs and symptoms of hypoglycemia. They  include:  Dizziness.  Shaking.  Sweating.  Chills.  Confusion.  Drink plenty of water while you exercise to prevent dehydration or heat stroke. Body water is lost during exercise and must be replaced.  Talk to your health care provider before starting an exercise program to make sure it is safe for you. Remember, almost any type of activity is better than none. Document Released: 05/28/2003 Document Revised: 11/07/2012 Document Reviewed: 08/14/2012 [facility] Patient Information 2014 [place], [place].

## 2013-03-19 NOTE — Progress Notes (Signed)
   Subjective:    Patient ID: Diane Morgan, female    DOB: 03-08-1971, 42 y.o.   MRN: [id number]  HPI This 42 y.o. female presents for evaluation of routine follow up.  She has hx of diabetes and hypertension.  She has been having occasional right ear discomfort and she states her ear pops occasionally.   Review of Systems No chest pain, SOB, HA, dizziness, vision change, N/V, diarrhea, constipation, dysuria, urinary urgency or frequency, myalgias, arthralgias or rash.     Objective:   Physical Exam  Vital signs noted  Well developed well nourished obese female.  HEENT - Head atraumatic Normocephalic                Eyes - PERRLA, Conjuctiva - clear Sclera- Clear EOMI                Ears - EAC's Wnl TM's Wnl Gross Hearing WNL                Throat - oropharanx wnl Respiratory - Lungs CTA bilateral Cardiac - RRR S1 and S2 without murmur GI - Abdomen soft Nontender and bowel sounds active x 4 Extremities - No edema. Neuro - Grossly intact.      Assessment & Plan:  Diabetes - Plan: CMP14+EGFR, POCT CBC, Thyroid Panel With TSH, Vit D  25 hydroxy (rtn osteoporosis monitoring), POCT glycosylated hemoglobin (Hb A1C)  Need for Tdap vaccination - Plan: Tdap vaccine greater than or equal to 7yo IM  ETD - Discussed taking otc allegra and use valsalva maneuver prn.  Deatra Canter FNP

## 2013-03-20 LAB — CBC WITH DIFFERENTIAL
Basophils Absolute: 0 10*3/uL (ref 0.0–0.2)
Basos: 1 %
Eos: 4 %
Eosinophils Absolute: 0.3 10*3/uL (ref 0.0–0.4)
HCT: 35.3 % (ref 34.0–46.6)
Hemoglobin: 11.3 g/dL (ref 11.1–15.9)
Immature Grans (Abs): 0 10*3/uL (ref 0.0–0.1)
Immature Granulocytes: 0 %
Lymphocytes Absolute: 2.1 10*3/uL (ref 0.7–3.1)
Lymphs: 32 %
MCH: 25.6 pg — ABNORMAL LOW (ref 26.6–33.0)
MCHC: 32 g/dL (ref 31.5–35.7)
MCV: 80 fL (ref 79–97)
Monocytes Absolute: 0.4 10*3/uL (ref 0.1–0.9)
Monocytes: 7 %
Neutrophils Absolute: 3.7 10*3/uL (ref 1.4–7.0)
Neutrophils Relative %: 56 %
Platelets: 274 10*3/uL (ref 150–379)
RBC: 4.42 x10E6/uL (ref 3.77–5.28)
RDW: 15.4 % (ref 12.3–15.4)
WBC: 6.5 10*3/uL (ref 3.4–10.8)

## 2013-03-20 LAB — CMP14+EGFR
ALT: 13 IU/L (ref 0–32)
AST: 15 IU/L (ref 0–40)
Albumin/Globulin Ratio: 1.6 (ref 1.1–2.5)
Albumin: 3.7 g/dL (ref 3.5–5.5)
Alkaline Phosphatase: 112 IU/L (ref 39–117)
BUN/Creatinine Ratio: 12 (ref 9–23)
BUN: 11 mg/dL (ref 6–24)
CO2: 23 mmol/L (ref 18–29)
Calcium: 9.2 mg/dL (ref 8.7–10.2)
Chloride: 103 mmol/L (ref 97–108)
Creatinine, Ser: 0.89 mg/dL (ref 0.57–1.00)
GFR calc Af Amer: 94 mL/min/{1.73_m2} (ref >59)
GFR calc non Af Amer: 81 mL/min/{1.73_m2} (ref >59)
Globulin, Total: 2.3 g/dL (ref 1.5–4.5)
Glucose: 126 mg/dL — ABNORMAL HIGH (ref 65–99)
Potassium: 5.4 mmol/L — ABNORMAL HIGH (ref 3.5–5.2)
Sodium: 139 mmol/L (ref 134–144)
Total Bilirubin: <0.2 mg/dL (ref 0.0–1.2)
Total Protein: 6 g/dL (ref 6.0–8.5)

## 2013-03-20 LAB — THYROID PANEL WITH TSH
Free Thyroxine Index: 1.7 (ref 1.2–4.9)
T3 Uptake Ratio: 26 % (ref 24–39)
T4, Total: 6.6 ug/dL (ref 4.5–12.0)
TSH: 2.99 u[IU]/mL (ref 0.450–4.500)

## 2013-03-20 LAB — VITAMIN D 25 HYDROXY (VIT D DEFICIENCY, FRACTURES): Vit D, 25-Hydroxy: 18.7 ng/mL — ABNORMAL LOW (ref 30.0–100.0)

## 2013-03-25 ENCOUNTER — Other Ambulatory Visit: Payer: Self-pay | Admitting: Family Medicine

## 2013-03-25 MED ORDER — VITAMIN D (ERGOCALCIFEROL) 1.25 MG (50000 UNIT) PO CAPS: 50000.0000 [IU] | ORAL_CAPSULE | ORAL | Status: DC

## 2013-04-02 ENCOUNTER — Other Ambulatory Visit: Payer: Self-pay | Admitting: Family Medicine

## 2013-04-05 ENCOUNTER — Telehealth: Payer: Self-pay | Admitting: Family Medicine

## 2013-04-08 NOTE — Telephone Encounter (Signed)
Patient aware.

## 2013-04-10 ENCOUNTER — Telehealth: Payer: Self-pay | Admitting: Family Medicine

## 2013-05-13 ENCOUNTER — Other Ambulatory Visit: Payer: Self-pay | Admitting: *Deleted

## 2013-05-13 MED ORDER — METFORMIN HCL 1000 MG PO TABS: 1000.0000 mg | ORAL_TABLET | Freq: Two times a day (BID) | ORAL | Status: DC

## 2013-05-24 NOTE — Telephone Encounter (Signed)
PT WAS TAKEN CARE OF.Marland Kitchen..Marland Kitchen

## 2013-06-08 ENCOUNTER — Other Ambulatory Visit: Payer: Self-pay | Admitting: Family Medicine

## 2013-06-10 ENCOUNTER — Other Ambulatory Visit: Payer: Self-pay | Admitting: *Deleted

## 2013-06-10 MED ORDER — LISINOPRIL 5 MG PO TABS: 5.0000 mg | ORAL_TABLET | Freq: Every day | ORAL | Status: DC

## 2013-06-18 ENCOUNTER — Ambulatory Visit: Payer: Medicaid Other | Admitting: Family Medicine

## 2013-06-19 ENCOUNTER — Encounter: Payer: Self-pay | Admitting: Family Medicine

## 2013-06-19 ENCOUNTER — Ambulatory Visit (INDEPENDENT_AMBULATORY_CARE_PROVIDER_SITE_OTHER): Payer: Medicaid Other | Admitting: Family Medicine

## 2013-06-19 VITALS — BP 112/65 | HR 88 | Temp 97.4°F | Ht 61.0 in | Wt 274.2 lb

## 2013-06-19 DIAGNOSIS — E559 Vitamin D deficiency, unspecified: Secondary | ICD-10-CM

## 2013-06-19 DIAGNOSIS — E785 Hyperlipidemia, unspecified: Secondary | ICD-10-CM

## 2013-06-19 DIAGNOSIS — D649 Anemia, unspecified: Secondary | ICD-10-CM

## 2013-06-19 DIAGNOSIS — E119 Type 2 diabetes mellitus without complications: Secondary | ICD-10-CM

## 2013-06-19 LAB — POCT CBC
Granulocyte percent: 65.8 %G (ref 37–80)
HCT, POC: 33.9 % — AB (ref 37.7–47.9)
Hemoglobin: 10.9 g/dL — AB (ref 12.2–16.2)
Lymph, poc: 2.3 (ref 0.6–3.4)
MCH, POC: 24.6 pg — AB (ref 27–31.2)
MCHC: 32.2 g/dL (ref 31.8–35.4)
MCV: 76.4 fL — AB (ref 80–97)
MPV: 9.8 fL (ref 0–99.8)
POC Granulocyte: 5.2 (ref 2–6.9)
POC LYMPH PERCENT: 29 %L (ref 10–50)
Platelet Count, POC: 224 10*3/uL (ref 142–424)
RBC: 4.4 M/uL (ref 4.04–5.48)
RDW, POC: 16.2 %
WBC: 7.9 10*3/uL (ref 4.6–10.2)

## 2013-06-19 LAB — POCT UA - MICROALBUMIN: Microalbumin Ur, POC: NEGATIVE mg/L

## 2013-06-19 LAB — POCT GLYCOSYLATED HEMOGLOBIN (HGB A1C): Hemoglobin A1C: 7.7

## 2013-06-19 NOTE — Progress Notes (Signed)
   Subjective:    Patient ID: Diane Morgan, female    DOB: 05/27/1970, 43 y.o.   MRN: [id number]  HPI  This 43 y.o. female presents for evaluation of diabetes visit.  She has no acute complaints And denies any problems with hyperglycemia.  Review of Systems    No chest pain, SOB, HA, dizziness, vision change, N/V, diarrhea, constipation, dysuria, urinary urgency or frequency, myalgias, arthralgias or rash.  Objective:   Physical Exam  Vital signs noted  Well developed well nourished obese female.  HEENT - Head atraumatic Normocephalic                Eyes - PERRLA, Conjuctiva - clear Sclera- Clear EOMI                Ears - EAC's Wnl TM's Wnl Gross Hearing WNL                Throat - oropharanx wnl Respiratory - Lungs CTA bilateral Cardiac - RRR S1 and S2 without murmur GI - Abdomen soft Nontender and bowel sounds active x 4 Extremities - No edema. Neuro - Grossly intact.  Results for orders placed in visit on 06/19/13  POCT GLYCOSYLATED HEMOGLOBIN (HGB A1C)      Result Value Ref Range   Hemoglobin A1C 7.7%    POCT CBC      Result Value Ref Range   WBC 7.9  4.6 - 10.2 K/uL   Lymph, poc 2.3  0.6 - 3.4   POC LYMPH PERCENT 29.0  10 - 50 %L   POC Granulocyte 5.2  2 - 6.9   Granulocyte percent 65.8  37 - 80 %G   RBC 4.4  4.04 - 5.48 M/uL   Hemoglobin 10.9 (*) 12.2 - 16.2 g/dL   HCT, POC 33.9 (*) 37.7 - 47.9 %   MCV 76.4 (*) 80 - 97 fL   MCH, POC 24.6 (*) 27 - 31.2 pg   MCHC 32.2  31.8 - 35.4 g/dL   RDW, POC 16.2     Platelet Count, POC 224.0  142 - 424 K/uL   MPV 9.8  0 - 99.8 fL  POCT UA - MICROALBUMIN      Result Value Ref Range   Microalbumin Ur, POC negative        Assessment & Plan:  Anemia - Plan: POCT CBC  Vitamin D deficiency - Plan: Vit D  25 hydroxy (rtn osteoporosis monitoring)  Hyperlipemia - Plan: Lipid panel, CMP14+EGFR  Diabetes mellitus, type 2 - Plan: POCT glycosylated hemoglobin (Hb A1C), POCT UA - Microalbumin, CMP14+EGFR Recommend diet and  exercise and avoid excessive carbs  Follow up in 3 months  Lysbeth Penner FNP

## 2013-06-20 LAB — LIPID PANEL
Chol/HDL Ratio: 5.3 ratio units — ABNORMAL HIGH (ref 0.0–4.4)
Cholesterol, Total: 121 mg/dL (ref 100–199)
HDL: 23 mg/dL — ABNORMAL LOW (ref >39)
LDL Calculated: 46 mg/dL (ref 0–99)
Triglycerides: 259 mg/dL — ABNORMAL HIGH (ref 0–149)
VLDL Cholesterol Cal: 52 mg/dL — ABNORMAL HIGH (ref 5–40)

## 2013-06-20 LAB — CMP14+EGFR
ALT: 9 IU/L (ref 0–32)
AST: 16 IU/L (ref 0–40)
Albumin/Globulin Ratio: 1.5 (ref 1.1–2.5)
Albumin: 3.4 g/dL — ABNORMAL LOW (ref 3.5–5.5)
Alkaline Phosphatase: 124 IU/L — ABNORMAL HIGH (ref 39–117)
BUN/Creatinine Ratio: 13 (ref 9–23)
BUN: 11 mg/dL (ref 6–24)
CO2: 20 mmol/L (ref 18–29)
Calcium: 8.7 mg/dL (ref 8.7–10.2)
Chloride: 105 mmol/L (ref 97–108)
Creatinine, Ser: 0.88 mg/dL (ref 0.57–1.00)
GFR calc Af Amer: 95 mL/min/{1.73_m2} (ref >59)
GFR calc non Af Amer: 82 mL/min/{1.73_m2} (ref >59)
Globulin, Total: 2.3 g/dL (ref 1.5–4.5)
Glucose: 94 mg/dL (ref 65–99)
Potassium: 5.6 mmol/L — ABNORMAL HIGH (ref 3.5–5.2)
Sodium: 138 mmol/L (ref 134–144)
Total Bilirubin: 0.3 mg/dL (ref 0.0–1.2)
Total Protein: 5.7 g/dL — ABNORMAL LOW (ref 6.0–8.5)

## 2013-06-20 LAB — VITAMIN D 25 HYDROXY (VIT D DEFICIENCY, FRACTURES): Vit D, 25-Hydroxy: 18.9 ng/mL — ABNORMAL LOW (ref 30.0–100.0)

## 2013-06-28 ENCOUNTER — Other Ambulatory Visit: Payer: Self-pay | Admitting: Family Medicine

## 2013-06-28 MED ORDER — VITAMIN D (ERGOCALCIFEROL) 1.25 MG (50000 UNIT) PO CAPS: 50000.0000 [IU] | ORAL_CAPSULE | ORAL | Status: DC

## 2013-08-27 ENCOUNTER — Ambulatory Visit (INDEPENDENT_AMBULATORY_CARE_PROVIDER_SITE_OTHER): Payer: Medicaid Other | Admitting: Family Medicine

## 2013-08-27 ENCOUNTER — Encounter: Payer: Self-pay | Admitting: Family Medicine

## 2013-08-27 VITALS — BP 117/71 | HR 80 | Temp 98.2°F | Ht 61.0 in | Wt 271.0 lb

## 2013-08-27 DIAGNOSIS — J069 Acute upper respiratory infection, unspecified: Secondary | ICD-10-CM

## 2013-08-27 DIAGNOSIS — J3489 Other specified disorders of nose and nasal sinuses: Secondary | ICD-10-CM

## 2013-08-27 DIAGNOSIS — N912 Amenorrhea, unspecified: Secondary | ICD-10-CM

## 2013-08-27 LAB — POCT URINE PREGNANCY: Preg Test, Ur: NEGATIVE

## 2013-08-27 MED ORDER — AZITHROMYCIN 250 MG PO TABS: ORAL_TABLET | ORAL | Status: DC

## 2013-08-27 MED ORDER — MUPIROCIN CALCIUM 2 % EX CREA: 1.0000 "application " | TOPICAL_CREAM | Freq: Two times a day (BID) | CUTANEOUS | Status: DC

## 2013-08-27 NOTE — Progress Notes (Signed)
   Subjective:    Patient ID: Diane Morgan, female    DOB: June 18, 1970, 43 y.o.   MRN: [id number]  HPI C/o uri sx's and a left nasal sore that is not healing.  She has had cough and congestion and sore throat for over a few days.   Review of Systems    No chest pain, SOB, HA, dizziness, vision change, N/V, diarrhea, constipation, dysuria, urinary urgency or frequency, myalgias, arthralgias or rash.  Objective:   Physical Exam  Vital signs noted  Well developed well nourished female.  HEENT - Head atraumatic Normocephalic                Eyes - PERRLA, Conjuctiva - clear Sclera- Clear EOMI                Ears - EAC's Wnl TM's Wnl Gross Hearing WNL                Nose - Nares patent                 Throat - oropharanx wnl Respiratory - Lungs CTA bilateral Cardiac - RRR S1 and S2 without murmur GI - Abdomen soft Nontender and bowel sounds active x 4 Extremities - No edema. Neuro - Grossly intact.      Assessment & Plan:  Amenorrhea - Plan: POCT urine pregnancy, Follicle Stimulating Hormone  URI (upper respiratory infection) - Plan: azithromycin (ZITHROMAX) 250 MG tablet Push po fluids, rest, tylenol and motrin otc prn as directed for fever, arthralgias, and myalgias.  Follow up prn if sx's continue or persist.   Nasal sore - Plan: mupirocin cream (BACTROBAN) 2 %  Deatra Canter FNP

## 2013-08-28 LAB — FOLLICLE STIMULATING HORMONE: FSH: 8.9 m[IU]/mL

## 2013-09-02 ENCOUNTER — Telehealth: Payer: Self-pay | Admitting: *Deleted

## 2013-09-02 NOTE — Telephone Encounter (Signed)
Pt notified labs were normal Verbalizes understanding

## 2013-09-08 ENCOUNTER — Other Ambulatory Visit: Payer: Self-pay | Admitting: *Deleted

## 2013-09-08 MED ORDER — ATORVASTATIN CALCIUM 40 MG PO TABS: ORAL_TABLET | ORAL | Status: DC

## 2013-09-13 ENCOUNTER — Other Ambulatory Visit: Payer: Self-pay | Admitting: Nurse Practitioner

## 2013-09-13 ENCOUNTER — Other Ambulatory Visit: Payer: Self-pay | Admitting: Family Medicine

## 2013-09-17 ENCOUNTER — Other Ambulatory Visit: Payer: Self-pay | Admitting: Family Medicine

## 2013-10-22 ENCOUNTER — Other Ambulatory Visit: Payer: Self-pay | Admitting: Family Medicine

## 2013-11-14 ENCOUNTER — Other Ambulatory Visit: Payer: Self-pay | Admitting: Nurse Practitioner

## 2013-11-16 ENCOUNTER — Ambulatory Visit (INDEPENDENT_AMBULATORY_CARE_PROVIDER_SITE_OTHER): Payer: Medicaid Other | Admitting: General Practice

## 2013-11-16 ENCOUNTER — Encounter: Payer: Self-pay | Admitting: General Practice

## 2013-11-16 VITALS — BP 143/92 | HR 76 | Temp 96.6°F | Ht 61.0 in | Wt 273.2 lb

## 2013-11-16 DIAGNOSIS — L0201 Cutaneous abscess of face: Secondary | ICD-10-CM

## 2013-11-16 DIAGNOSIS — J340 Abscess, furuncle and carbuncle of nose: Secondary | ICD-10-CM

## 2013-11-16 DIAGNOSIS — L03211 Cellulitis of face: Secondary | ICD-10-CM

## 2013-11-16 MED ORDER — SULFAMETHOXAZOLE-TMP DS 800-160 MG PO TABS
1.0000 | ORAL_TABLET | Freq: Two times a day (BID) | ORAL | Status: DC
Start: 2013-11-16 — End: 2014-01-22

## 2013-11-16 NOTE — Progress Notes (Signed)
   Subjective:    Patient ID: Diane Morgan, female    DOB: 1970/04/06, 43 y.o.   MRN: [id number]  HPI Patient presents with complaints of nasal soreness and redness. Onset of symptoms 2 days ago and gradually worsening. Reports right side of nose to tip, as tender and warm to touch. Denies drainage or fever. Denies OTC medications. Seraphina has been using Bactroban ointment, with no relief. Reports blood sugar controlled.  Reports last menstrual cycle 11/03/2013, regular.    Review of Systems  Constitutional: Negative for fever and chills.  HENT: Negative for ear pain, postnasal drip, sinus pressure, sneezing and sore throat.   Respiratory: Negative for chest tightness and shortness of breath.   Cardiovascular: Negative for chest pain and palpitations.  Skin:       Redness, swelling, and tenderness to nose       Objective:   Physical Exam  Constitutional: She is oriented to person, place, and time. She appears well-developed and well-nourished.  HENT:  Head: Normocephalic and atraumatic.  Right Ear: External ear normal.  Left Ear: External ear normal.  Mouth/Throat: Oropharynx is clear and moist.  Eyes: Conjunctivae are normal.  Cardiovascular: Normal rate, regular rhythm and normal heart sounds.   Pulmonary/Chest: Effort normal and breath sounds normal. No respiratory distress. She exhibits no tenderness.  Neurological: She is alert and oriented to person, place, and time.  Skin: Skin is warm and dry. There is erythema.  Moderate erythema, mild edema noted from right outer nose to tip. Warm to touch. Negative drainage.   Psychiatric: She has a normal mood and affect.          Assessment & Plan:  1. Cellulitis of nasal tip - sulfamethoxazole-trimethoprim (BACTRIM DS) 800-160 MG per tablet; Take 1 tablet by mouth 2 (two) times daily.  Dispense: 20 tablet; Refill: 0 -Provided and discussed patient education on cellulitis -RTO if symptoms worsen or unresolved, may refer to  specilaist -Seek emergency medical treatment if symptoms worse -Patient verbalized understanding Coralie Keens, FNP-C

## 2013-11-16 NOTE — Patient Instructions (Signed)

## 2013-11-18 ENCOUNTER — Other Ambulatory Visit: Payer: Self-pay | Admitting: Nurse Practitioner

## 2013-11-18 NOTE — Telephone Encounter (Signed)
Last AIC 7.7 on 06/19/13.

## 2013-12-04 ENCOUNTER — Other Ambulatory Visit: Payer: Self-pay | Admitting: Family Medicine

## 2013-12-13 ENCOUNTER — Other Ambulatory Visit: Payer: Self-pay | Admitting: Family Medicine

## 2013-12-14 ENCOUNTER — Ambulatory Visit (INDEPENDENT_AMBULATORY_CARE_PROVIDER_SITE_OTHER): Payer: Medicaid Other | Admitting: *Deleted

## 2013-12-14 ENCOUNTER — Ambulatory Visit: Payer: Medicaid Other

## 2013-12-14 ENCOUNTER — Other Ambulatory Visit: Payer: Self-pay | Admitting: Family Medicine

## 2013-12-14 VITALS — BP 97/69 | HR 86

## 2013-12-14 DIAGNOSIS — I10 Essential (primary) hypertension: Secondary | ICD-10-CM

## 2013-12-14 NOTE — Progress Notes (Signed)
Patient ID: Diane Morgan, female   DOB: April 03, 1970, 43 y.o.   MRN: [id number] Patient came in today for a nurse Bp check. Patients BP 97/69  Pulse 86. Patient aware that I will send this reading over to Nils Pyle, FNP and if any changes needed to be made we will contact her.

## 2013-12-23 ENCOUNTER — Other Ambulatory Visit: Payer: Self-pay | Admitting: Family Medicine

## 2014-01-12 ENCOUNTER — Other Ambulatory Visit: Payer: Self-pay | Admitting: Family Medicine

## 2014-01-14 NOTE — Telephone Encounter (Signed)
Last seen 11/16/13 Mae  Last glucose 06/19/13

## 2014-01-14 NOTE — Telephone Encounter (Signed)
no more refills without being seen  

## 2014-01-21 ENCOUNTER — Telehealth: Payer: Self-pay | Admitting: Family Medicine

## 2014-01-21 NOTE — Telephone Encounter (Signed)
appt made

## 2014-01-22 ENCOUNTER — Ambulatory Visit (INDEPENDENT_AMBULATORY_CARE_PROVIDER_SITE_OTHER): Payer: Medicaid Other | Admitting: Family Medicine

## 2014-01-22 ENCOUNTER — Encounter: Payer: Self-pay | Admitting: Family Medicine

## 2014-01-22 ENCOUNTER — Ambulatory Visit (INDEPENDENT_AMBULATORY_CARE_PROVIDER_SITE_OTHER): Payer: Medicaid Other

## 2014-01-22 VITALS — BP 147/78 | HR 109 | Temp 98.3°F | Ht 61.0 in | Wt 275.8 lb

## 2014-01-22 DIAGNOSIS — Z23 Encounter for immunization: Secondary | ICD-10-CM

## 2014-01-22 DIAGNOSIS — M25561 Pain in right knee: Secondary | ICD-10-CM

## 2014-01-22 MED ORDER — NAPROXEN 500 MG PO TABS: 500.0000 mg | ORAL_TABLET | Freq: Two times a day (BID) | ORAL | Status: DC

## 2014-01-22 MED ORDER — LISINOPRIL 5 MG PO TABS: ORAL_TABLET | ORAL | Status: DC

## 2014-01-22 MED ORDER — GABAPENTIN 300 MG PO CAPS: ORAL_CAPSULE | ORAL | Status: DC

## 2014-01-22 MED ORDER — METFORMIN HCL 1000 MG PO TABS: ORAL_TABLET | ORAL | Status: DC

## 2014-01-22 NOTE — Progress Notes (Signed)
   Subjective:    Patient ID: Diane BrunswickRhonda Morgan, female    DOB: May 03, 1970, 43 y.o.   MRN: [id number]  HPI C/o right knee pain after slipping and falling and twisting right knee.  Review of Systems  Constitutional: Negative for fever.  HENT: Negative for ear pain.   Eyes: Negative for discharge.  Respiratory: Negative for cough.   Cardiovascular: Negative for chest pain.  Gastrointestinal: Negative for abdominal distention.  Endocrine: Negative for polyuria.  Genitourinary: Negative for difficulty urinating.  Musculoskeletal: Negative for gait problem and neck pain.  Skin: Negative for color change and rash.  Neurological: Negative for speech difficulty and headaches.  Psychiatric/Behavioral: Negative for agitation.       Objective:    BP 147/78 mmHg  Pulse 109  Temp(Src) 98.3 F (36.8 C) (Oral)  Ht 5\' 1"  (1.549 m)  Wt 275 lb 12.8 oz (125.102 kg)  BMI 52.14 kg/m2  LMP 01/03/2014 Physical Exam TTP right knee right.  Negative varus and valgus strain.  No deformity right knee.   Xray right knee - Possible baker's cyst Deatra CanterWilliam J Shomari Scicchitano FNP    Assessment & Plan:     ICD-9-CM ICD-10-CM   1. Right knee pain 719.46 M25.561 DG Knee 1-2 Views Right     naproxen (NAPROSYN) 500 MG tablet  2. Encounter for immunization Z23 Z23      No Follow-up on file.  Deatra CanterWilliam J Dierdra Salameh FNP

## 2014-02-02 ENCOUNTER — Other Ambulatory Visit: Payer: Self-pay | Admitting: Family Medicine

## 2014-02-19 ENCOUNTER — Encounter: Payer: Self-pay | Admitting: Family Medicine

## 2014-02-19 ENCOUNTER — Ambulatory Visit (INDEPENDENT_AMBULATORY_CARE_PROVIDER_SITE_OTHER): Payer: Medicaid Other | Admitting: Family Medicine

## 2014-02-19 VITALS — BP 141/90 | HR 92 | Temp 97.6°F | Ht 61.0 in | Wt 271.8 lb

## 2014-02-19 DIAGNOSIS — E1165 Type 2 diabetes mellitus with hyperglycemia: Secondary | ICD-10-CM

## 2014-02-19 DIAGNOSIS — M25561 Pain in right knee: Secondary | ICD-10-CM

## 2014-02-19 DIAGNOSIS — IMO0001 Reserved for inherently not codable concepts without codable children: Secondary | ICD-10-CM

## 2014-02-19 DIAGNOSIS — D508 Other iron deficiency anemias: Secondary | ICD-10-CM

## 2014-02-19 DIAGNOSIS — E785 Hyperlipidemia, unspecified: Secondary | ICD-10-CM

## 2014-02-19 LAB — POCT CBC
Granulocyte percent: 60.6 %G (ref 37–80)
HCT, POC: 35.5 % — AB (ref 37.7–47.9)
Hemoglobin: 11 g/dL — AB (ref 12.2–16.2)
Lymph, poc: 1.9 (ref 0.6–3.4)
MCH, POC: 23.4 pg — AB (ref 27–31.2)
MCHC: 30.9 g/dL — AB (ref 31.8–35.4)
MCV: 75.6 fL — AB (ref 80–97)
MPV: 8.9 fL (ref 0–99.8)
POC Granulocyte: 3.5 (ref 2–6.9)
POC LYMPH PERCENT: 32 %L (ref 10–50)
Platelet Count, POC: 237 10*3/uL (ref 142–424)
RBC: 4.7 M/uL (ref 4.04–5.48)
RDW, POC: 17 %
WBC: 5.8 10*3/uL (ref 4.6–10.2)

## 2014-02-19 LAB — POCT GLYCOSYLATED HEMOGLOBIN (HGB A1C): Hemoglobin A1C: 7.9

## 2014-02-19 MED ORDER — GABAPENTIN 300 MG PO CAPS: ORAL_CAPSULE | ORAL | Status: DC

## 2014-02-19 MED ORDER — ATORVASTATIN CALCIUM 40 MG PO TABS: ORAL_TABLET | ORAL | Status: DC

## 2014-02-19 MED ORDER — GLIPIZIDE 10 MG PO TABS: 10.0000 mg | ORAL_TABLET | Freq: Every day | ORAL | Status: DC

## 2014-02-19 NOTE — Progress Notes (Signed)
   Subjective:    Patient ID: Diane Morgan, female    DOB: [birth year], 43 y.o.   MRN: [id number]  HPI Patient is here for c/o diabetes follow up.  She has been having persistent right knee pain after injury over a month ago.  She has hx of hyperlipidemia and neuropathy.  Review of Systems  Constitutional: Negative for fever.  HENT: Negative for ear pain.   Eyes: Negative for discharge.  Respiratory: Negative for cough.   Cardiovascular: Negative for chest pain.  Gastrointestinal: Negative for abdominal distention.  Endocrine: Negative for polyuria.  Genitourinary: Negative for difficulty urinating.  Musculoskeletal: Negative for gait problem and neck pain.       Right knee pain  Skin: Negative for color change and rash.  Neurological: Negative for speech difficulty and headaches.  Psychiatric/Behavioral: Negative for agitation.       Objective:    BP 141/90 mmHg  Pulse 92  Temp(Src) 97.6 F (36.4 C) (Oral)  Ht _0  (1.549 m)  Wt 271 lb 12.8 oz (123.288 kg)  BMI 51.38 kg/m2  LMP 02/12/2014 Physical Exam  Constitutional: She is oriented to person, place, and time. She appears well-developed and well-nourished.  HENT:  Head: Normocephalic and atraumatic.  Mouth/Throat: Oropharynx is clear and moist.  Eyes: Pupils are equal, round, and reactive to light.  Neck: Normal range of motion. Neck supple.  Cardiovascular: Normal rate and regular rhythm.   No murmur heard. Pulmonary/Chest: Effort normal and breath sounds normal.  Abdominal: Soft. Bowel sounds are normal. There is no tenderness.  Musculoskeletal:  TTP lateral right knee  Neurological: She is alert and oriented to person, place, and time.  Skin: Skin is warm and dry.  Psychiatric: She has a normal mood and affect.          Assessment & Plan:     ICD-9-CM ICD-10-CM   1. Hyperlipemia 272.4 E78.5 Lipid panel  2. Uncontrolled diabetes mellitus type 2 without complications 591.02 I90.22 POCT glycosylated  hemoglobin (Hb A1C)     CMP14+EGFR  3. Other iron deficiency anemias 280.8 D50.8 POCT CBC  4. Right knee pain 719.46 M25.561 Ambulatory referral to Orthopedic Surgery     Return in about 3 months (around 05/21/2014).  Lysbeth Penner FNP

## 2014-02-20 ENCOUNTER — Other Ambulatory Visit: Payer: Self-pay | Admitting: Family Medicine

## 2014-02-20 LAB — CMP14+EGFR
ALT: 9 IU/L (ref 0–32)
AST: 6 IU/L (ref 0–40)
Albumin/Globulin Ratio: 1.5 (ref 1.1–2.5)
Albumin: 3.4 g/dL — ABNORMAL LOW (ref 3.5–5.5)
Alkaline Phosphatase: 113 IU/L (ref 39–117)
BUN/Creatinine Ratio: 15 (ref 9–23)
BUN: 15 mg/dL (ref 6–24)
CO2: 21 mmol/L (ref 18–29)
Calcium: 8.8 mg/dL (ref 8.7–10.2)
Chloride: 103 mmol/L (ref 97–108)
Creatinine, Ser: 1.03 mg/dL — ABNORMAL HIGH (ref 0.57–1.00)
GFR calc Af Amer: 78 mL/min/{1.73_m2} (ref >59)
GFR calc non Af Amer: 68 mL/min/{1.73_m2} (ref >59)
Globulin, Total: 2.2 g/dL (ref 1.5–4.5)
Glucose: 216 mg/dL — ABNORMAL HIGH (ref 65–99)
Potassium: 5.3 mmol/L — ABNORMAL HIGH (ref 3.5–5.2)
Sodium: 138 mmol/L (ref 134–144)
Total Bilirubin: 0.2 mg/dL (ref 0.0–1.2)
Total Protein: 5.6 g/dL — ABNORMAL LOW (ref 6.0–8.5)

## 2014-02-20 LAB — LIPID PANEL
Chol/HDL Ratio: 6.9 ratio units — ABNORMAL HIGH (ref 0.0–4.4)
Cholesterol, Total: 173 mg/dL (ref 100–199)
HDL: 25 mg/dL — ABNORMAL LOW (ref >39)
LDL Calculated: 85 mg/dL (ref 0–99)
Triglycerides: 317 mg/dL — ABNORMAL HIGH (ref 0–149)
VLDL Cholesterol Cal: 63 mg/dL — ABNORMAL HIGH (ref 5–40)

## 2014-03-17 ENCOUNTER — Other Ambulatory Visit: Payer: Self-pay | Admitting: Family Medicine

## 2014-03-17 ENCOUNTER — Other Ambulatory Visit: Payer: Self-pay | Admitting: Nurse Practitioner

## 2014-03-24 ENCOUNTER — Other Ambulatory Visit: Payer: Self-pay | Admitting: Family Medicine

## 2014-03-29 ENCOUNTER — Other Ambulatory Visit: Payer: Self-pay | Admitting: Family Medicine

## 2014-05-25 ENCOUNTER — Other Ambulatory Visit: Payer: Self-pay | Admitting: Family Medicine

## 2014-06-19 ENCOUNTER — Other Ambulatory Visit: Payer: Self-pay | Admitting: Nurse Practitioner

## 2014-06-19 ENCOUNTER — Other Ambulatory Visit: Payer: Self-pay | Admitting: Family Medicine

## 2014-07-03 ENCOUNTER — Other Ambulatory Visit: Payer: Self-pay | Admitting: Family Medicine

## 2014-07-21 ENCOUNTER — Other Ambulatory Visit: Payer: Self-pay | Admitting: Family Medicine

## 2014-08-25 ENCOUNTER — Other Ambulatory Visit: Payer: Self-pay | Admitting: Nurse Practitioner

## 2014-08-25 ENCOUNTER — Other Ambulatory Visit: Payer: Self-pay | Admitting: Family Medicine

## 2014-09-08 ENCOUNTER — Other Ambulatory Visit: Payer: Self-pay | Admitting: Family Medicine

## 2014-09-08 NOTE — Telephone Encounter (Signed)
Last sen 02/19/14 B [place]

## 2014-09-15 ENCOUNTER — Other Ambulatory Visit: Payer: Self-pay | Admitting: Family Medicine

## 2014-09-29 ENCOUNTER — Other Ambulatory Visit: Payer: Self-pay | Admitting: Family Medicine

## 2014-10-08 ENCOUNTER — Other Ambulatory Visit: Payer: Self-pay | Admitting: Family Medicine

## 2014-10-23 ENCOUNTER — Other Ambulatory Visit: Payer: Self-pay | Admitting: Family Medicine

## 2014-10-24 ENCOUNTER — Telehealth: Payer: Self-pay | Admitting: *Deleted

## 2014-10-24 NOTE — Telephone Encounter (Signed)
no more refills without being seen Patient NTBS for follow up and lab work  

## 2014-10-24 NOTE — Telephone Encounter (Signed)
Last seen 02/19/14 B [place] 

## 2014-10-24 NOTE — Telephone Encounter (Signed)
Patient will call back to make an appointment.

## 2014-11-06 ENCOUNTER — Other Ambulatory Visit: Payer: Self-pay | Admitting: Family Medicine

## 2014-11-14 ENCOUNTER — Ambulatory Visit (INDEPENDENT_AMBULATORY_CARE_PROVIDER_SITE_OTHER): Payer: Medicaid Other | Admitting: Family Medicine

## 2014-11-14 ENCOUNTER — Encounter: Payer: Self-pay | Admitting: Family Medicine

## 2014-11-14 VITALS — BP 148/83 | HR 75 | Temp 98.1°F | Ht 61.0 in | Wt 274.6 lb

## 2014-11-14 DIAGNOSIS — E1142 Type 2 diabetes mellitus with diabetic polyneuropathy: Secondary | ICD-10-CM | POA: Diagnosis not present

## 2014-11-14 DIAGNOSIS — I10 Essential (primary) hypertension: Secondary | ICD-10-CM | POA: Diagnosis not present

## 2014-11-14 DIAGNOSIS — D509 Iron deficiency anemia, unspecified: Secondary | ICD-10-CM

## 2014-11-14 DIAGNOSIS — E1140 Type 2 diabetes mellitus with diabetic neuropathy, unspecified: Secondary | ICD-10-CM

## 2014-11-14 LAB — POCT GLYCOSYLATED HEMOGLOBIN (HGB A1C): Hemoglobin A1C: 7.4

## 2014-11-14 LAB — POCT UA - MICROALBUMIN: MICROALBUMIN (UR) POC: 20 mg/L

## 2014-11-14 MED ORDER — PIOGLITAZONE HCL 30 MG PO TABS: 30.0000 mg | ORAL_TABLET | Freq: Every day | ORAL | Status: DC

## 2014-11-14 MED ORDER — ATORVASTATIN CALCIUM 40 MG PO TABS: 40.0000 mg | ORAL_TABLET | Freq: Every day | ORAL | Status: DC

## 2014-11-14 MED ORDER — GLIPIZIDE 10 MG PO TABS: 10.0000 mg | ORAL_TABLET | Freq: Every day | ORAL | Status: DC

## 2014-11-14 MED ORDER — LISINOPRIL 5 MG PO TABS: 5.0000 mg | ORAL_TABLET | Freq: Every day | ORAL | Status: DC

## 2014-11-14 MED ORDER — GABAPENTIN 300 MG PO CAPS: 300.0000 mg | ORAL_CAPSULE | Freq: Three times a day (TID) | ORAL | Status: DC

## 2014-11-14 NOTE — Progress Notes (Signed)
   HPI  Patient presents today to follow-up for diabetes, hypertension, obesity, and seasonal allergies  Diabetes Good medication compliance, has run out of a couple of meds in the last week. Fasting blood sugars average 90-110 No low blood sugars Exercising regularly with a personal trainer 3-4 times per week. Trying several diets but only watching her diet intermittently.  Hypertension Good medication compliance, how for one week No chest pain, dyspnea, palpitations, leg edema, headaches. Exercise above  Obesity Exercising as above, watching diet intermittently Unhappy with her current state, would like to get her thyroid checked  Seasonal allergies Has had more postnasal drip, throat clearing, and runny nose over the last 3 days after mowing. Taking Zyrtec with some improvement  PMH: Smoking status noted ROS: Per HPI, otherwise negative  Objective: There were no vitals taken for this visit. Gen: NAD, alert, cooperative with exam HEENT: NCAT CV: RRR, good S1/S2, no murmur Resp: CTABL, no wheezes, non-labored Ext: No edema, warm Neuro: Alert and oriented, No gross deficits Dynamic exam: 2+ DP pulses, large bunions bilaterally, no lesions, sensation intact to monofilament  Assessment and plan:  # Type 2 diabetes 100 control with A1c of 7.4 Continue current medications, Actos, glipizide, metformin Does have neuropathy, continue gabapentin Continue statin With her weight difficulties I think changing to GLP like Bydureon would be a good option if she continues to exercise in her thyroid is not affected currently.  # Obesity Good exercise, discussed diet, recommended low-carb high-protein diet Continue exercise Check thyroid Consider GLP-1  # Allergies, seasonal Good benefit from Zyrtec, continued, consisting use  # Hypertension Elevated today, no red flags Has been out for 1 week Refill, follow-up 6 weeks    Orders Placed This Encounter  Procedures  .  POCT glycosylated hemoglobin (Hb A1C)  . POCT UA - Microalbumin    Meds ordered this encounter  Medications  . omeprazole (PRILOSEC) 40 MG capsule    Sig: Take 40 mg by mouth daily.  . cetirizine (ZYRTEC) 10 MG tablet    Sig: Take 10 mg by mouth daily.  Marland Kitchen atorvastatin (LIPITOR) 40 MG tablet    Sig: Take 1 tablet (40 mg total) by mouth daily.    Dispense:  90 tablet    Refill:  3    Must be seen before next refill  . glipiZIDE (GLUCOTROL) 10 MG tablet    Sig: Take 1 tablet (10 mg total) by mouth daily before breakfast.    Dispense:  90 tablet    Refill:  3    Must be seen before next refill  . lisinopril (PRINIVIL,ZESTRIL) 5 MG tablet    Sig: Take 1 tablet (5 mg total) by mouth daily.    Dispense:  90 tablet    Refill:  3    Must be seen before next refill  . pioglitazone (ACTOS) 30 MG tablet    Sig: Take 1 tablet (30 mg total) by mouth daily.    Dispense:  90 tablet    Refill:  3  . gabapentin (NEURONTIN) 300 MG capsule    Sig: Take 1 capsule (300 mg total) by mouth 3 (three) times daily.    Dispense:  270 capsule    Refill:  3    Must be seen before next refill    Murtis Sink, MD [facility] 11/14/2014, 8:22 AM

## 2014-11-14 NOTE — Patient Instructions (Addendum)
It was great to meet you!  You are doing very good work with exercise! I like the idea of a low carb high protein diet, I would encourage you to continue that  Try a plain antihistamine before you mow and for a few days after it, Claritin or zyrtec are good examples   We will let you know about your labs within a week  Diet Recommendations for Diabetes   Starchy (carb) foods include: Bread, rice, pasta, potatoes, corn, crackers, bagels, muffins, all baked goods.   Protein foods include: Meat, fish, poultry, eggs, dairy foods, and beans such as pinto and kidney beans (beans also provide carbohydrate).   1. Eat at least 3 meals and 1-2 snacks per day. Never go more than 4-5 hours while awake without eating.  2. Limit starchy foods to TWO per meal and ONE per snack. ONE portion of a starchy  food is equal to the following:   - ONE slice of bread (or its equivalent, such as half of a hamburger bun).   - 1/2 cup of a "scoopable" starchy food such as potatoes or rice.   - 1 OUNCE (28 grams) of starchy snack foods such as crackers or pretzels (look on label).   - 15 grams of carbohydrate as shown on food label.  3. Both lunch and dinner should include a protein food, a carb food, and vegetables.   - Obtain twice as many veg's as protein or carbohydrate foods for both lunch and dinner.   - Try to keep frozen veg's on hand for a quick vegetable serving.     - Fresh or frozen veg's are best.  4. Breakfast should always include protein.

## 2014-11-15 LAB — LIPID PANEL
CHOLESTEROL TOTAL: 95 mg/dL — AB (ref 100–199)
Chol/HDL Ratio: 3.8 ratio units (ref 0.0–4.4)
HDL: 25 mg/dL — ABNORMAL LOW (ref >39)
LDL CALC: 28 mg/dL (ref 0–99)
Triglycerides: 208 mg/dL — ABNORMAL HIGH (ref 0–149)
VLDL Cholesterol Cal: 42 mg/dL — ABNORMAL HIGH (ref 5–40)

## 2014-11-15 LAB — CMP14+EGFR
A/G RATIO: 1.5 (ref 1.1–2.5)
ALT: 11 IU/L (ref 0–32)
AST: 12 IU/L (ref 0–40)
Albumin: 3.5 g/dL (ref 3.5–5.5)
Alkaline Phosphatase: 109 IU/L (ref 39–117)
BILIRUBIN TOTAL: 0.2 mg/dL (ref 0.0–1.2)
BUN/Creatinine Ratio: 17 (ref 9–23)
BUN: 18 mg/dL (ref 6–24)
CHLORIDE: 102 mmol/L (ref 97–108)
CO2: 19 mmol/L (ref 18–29)
Calcium: 8.3 mg/dL — ABNORMAL LOW (ref 8.7–10.2)
Creatinine, Ser: 1.09 mg/dL — ABNORMAL HIGH (ref 0.57–1.00)
GFR calc Af Amer: 72 mL/min/{1.73_m2} (ref >59)
GFR calc non Af Amer: 63 mL/min/{1.73_m2} (ref >59)
Globulin, Total: 2.3 g/dL (ref 1.5–4.5)
Glucose: 229 mg/dL — ABNORMAL HIGH (ref 65–99)
POTASSIUM: 5 mmol/L (ref 3.5–5.2)
Sodium: 139 mmol/L (ref 134–144)
Total Protein: 5.8 g/dL — ABNORMAL LOW (ref 6.0–8.5)

## 2014-11-15 LAB — CBC WITH DIFFERENTIAL/PLATELET
BASOS: 1 %
Basophils Absolute: 0.1 10*3/uL (ref 0.0–0.2)
EOS (ABSOLUTE): 0.4 10*3/uL (ref 0.0–0.4)
Eos: 5 %
Hematocrit: 31.2 % — ABNORMAL LOW (ref 34.0–46.6)
Hemoglobin: 9.8 g/dL — ABNORMAL LOW (ref 11.1–15.9)
IMMATURE GRANULOCYTES: 0 %
Immature Grans (Abs): 0 10*3/uL (ref 0.0–0.1)
Lymphocytes Absolute: 2.1 10*3/uL (ref 0.7–3.1)
Lymphs: 25 %
MCH: 23.2 pg — AB (ref 26.6–33.0)
MCHC: 31.4 g/dL — ABNORMAL LOW (ref 31.5–35.7)
MCV: 74 fL — AB (ref 79–97)
MONOS ABS: 0.7 10*3/uL (ref 0.1–0.9)
Monocytes: 8 %
NEUTROS ABS: 5.2 10*3/uL (ref 1.4–7.0)
NEUTROS PCT: 61 %
PLATELETS: 257 10*3/uL (ref 150–379)
RBC: 4.23 x10E6/uL (ref 3.77–5.28)
RDW: 17.8 % — AB (ref 12.3–15.4)
WBC: 8.4 10*3/uL (ref 3.4–10.8)

## 2014-11-15 LAB — T4, FREE: FREE T4: 1.19 ng/dL (ref 0.82–1.77)

## 2014-11-15 LAB — TSH: TSH: 4.95 u[IU]/mL — ABNORMAL HIGH (ref 0.450–4.500)

## 2014-11-15 LAB — MICROALBUMIN, URINE: Microalbumin, Urine: 4.3 ug/mL

## 2014-11-17 ENCOUNTER — Telehealth: Payer: Self-pay | Admitting: Family Medicine

## 2014-11-17 DIAGNOSIS — D509 Iron deficiency anemia, unspecified: Secondary | ICD-10-CM | POA: Insufficient documentation

## 2014-11-17 NOTE — Telephone Encounter (Signed)
Lab review:  She has mild anemia, probably iron defficinecy. We should check a ferritin level to confirm Her cholesterol is good Her kidneys show slight stress, probably from her diabetes- good diabetes control is essential! Her thyroid is a little bit low (elevated TSH), we could talk about starting synthroid but we really need to discuss it since her T4 (direct thyroid measurement) is normal.  We should follow up in about 1 month.     Murtis Sink, MD [facility] 11/17/2014, 1:06 PM

## 2014-11-19 ENCOUNTER — Telehealth: Payer: Self-pay | Admitting: Family Medicine

## 2014-11-20 NOTE — Telephone Encounter (Signed)
Patient aware of results.

## 2014-11-20 NOTE — Telephone Encounter (Signed)
Patient aware of results and verbalizes understanding.  

## 2014-11-21 ENCOUNTER — Telehealth: Payer: Self-pay | Admitting: Family Medicine

## 2014-11-21 LAB — SPECIMEN STATUS REPORT

## 2014-11-21 LAB — FERRITIN: Ferritin: 8 ng/mL — ABNORMAL LOW (ref 15–150)

## 2014-11-21 MED ORDER — FERROUS SULFATE 325 (65 FE) MG PO TABS: 325.0000 mg | ORAL_TABLET | Freq: Two times a day (BID) | ORAL | Status: DC

## 2014-11-21 NOTE — Addendum Note (Signed)
Addended by: Elenora Gamma on: 11/21/2014 07:53 AM   Modules accepted: Orders, SmartSet

## 2014-11-25 ENCOUNTER — Other Ambulatory Visit: Payer: Self-pay | Admitting: Nurse Practitioner

## 2014-12-26 ENCOUNTER — Ambulatory Visit (INDEPENDENT_AMBULATORY_CARE_PROVIDER_SITE_OTHER): Payer: Medicaid Other | Admitting: Family Medicine

## 2014-12-26 ENCOUNTER — Encounter: Payer: Self-pay | Admitting: Family Medicine

## 2014-12-26 VITALS — BP 121/80 | HR 89 | Temp 97.0°F | Ht 61.0 in | Wt 270.6 lb

## 2014-12-26 DIAGNOSIS — Z23 Encounter for immunization: Secondary | ICD-10-CM | POA: Diagnosis not present

## 2014-12-26 DIAGNOSIS — E038 Other specified hypothyroidism: Secondary | ICD-10-CM | POA: Diagnosis not present

## 2014-12-26 DIAGNOSIS — E039 Hypothyroidism, unspecified: Secondary | ICD-10-CM | POA: Insufficient documentation

## 2014-12-26 MED ORDER — LEVOTHYROXINE SODIUM 50 MCG PO TABS: 50.0000 ug | ORAL_TABLET | Freq: Every day | ORAL | Status: DC

## 2014-12-26 NOTE — Progress Notes (Signed)
   HPI  Patient presents today for follow-up of diabetes and discussion about her thyroid disease.  Patient explains that she is watching her diet carefully and compliant with all of her diabetic medications. Her A1c was controlled on last check at 7.4. She has had difficulty losing weight since she was a teenager.  She states that her energy is usually pretty okay, sometimes is decreased. She has hair loss that she feels that a normal amount for her. She denies finger nail changes or depression. She does have difficulty with weight gain.  PMH: Smoking status noted ROS: Per HPI  Objective: BP 121/80 mmHg  Pulse 89  Temp(Src) 97 F (36.1 C) (Oral)  Ht  (1.549 m)  Wt 270 lb 9.6 oz (122.743 kg)  BMI 51.16 kg/m2 Gen: NAD, alert, cooperative with exam HEENT: NCAT Neck: no thyromegaly or nodules felt CV: RRR, good S1/S2, no murmur Resp: CTABL, no wheezes, non-labored Ext: No edema, warm Neuro: Alert and oriented, No gross deficits  Assessment and plan:  # Subclinical hypothyroidism TSH persistently elevated, T4 normal Considering difficulty with weight gain I will go ahead and start a low dose of Synthroid, 50 g Recheck TSH in 2 months  # Diabetes Good diet and exercise habits, encouraged Continue current medications including metformin, Actos, and glipizide I believe that a GLP 1 white Bydureon may also be helpful to help her lose weight.      Meds ordered this encounter  Medications  . Probiotic Product (PROBIOTIC COLON SUPPORT PO)    Sig: Take by mouth.  . levothyroxine (SYNTHROID, LEVOTHROID) 50 MCG tablet    Sig: Take 1 tablet (50 mcg total) by mouth daily.    Dispense:  90 tablet    Refill:  3    Murtis Sink, MD [facility] 12/26/2014, 10:08 AM

## 2014-12-26 NOTE — Patient Instructions (Signed)
Great to see you again!  Lets follow up for Diabetes in 2 months  We have started synthroid (thyroid replacement medicine) because you're thyroid is a little bit low.   Hypothyroidism Hypothyroidism is a disorder of the thyroid. The thyroid is a large gland that is located in the lower front of the neck. The thyroid releases hormones that control how the body works. With hypothyroidism, the thyroid does not make enough of these hormones. CAUSES Causes of hypothyroidism may include:  Viral infections.  Pregnancy.  Your own defense system (immune system) attacking your thyroid.  Certain medicines.  Birth defects.  Past radiation treatments to your head or neck.  Past treatment with radioactive iodine.  Past surgical removal of part or all of your thyroid.  Problems with the gland that is located in the center of your brain (pituitary). SIGNS AND SYMPTOMS Signs and symptoms of hypothyroidism may include:  Feeling as though you have no energy (lethargy).  Inability to tolerate cold.  Weight gain that is not explained by a change in diet or exercise habits.  Dry skin.  Coarse hair.  Menstrual irregularity.  Slowing of thought processes.  Constipation.  Sadness or depression. DIAGNOSIS  Your health care provider may diagnose hypothyroidism with blood tests and ultrasound tests. TREATMENT Hypothyroidism is treated with medicine that replaces the hormones that your body does not make. After you begin treatment, it may take several weeks for symptoms to go away. HOME CARE INSTRUCTIONS   Take medicines only as directed by your health care provider.  If you start taking any new medicines, tell your health care provider.  Keep all follow-up visits as directed by your health care provider. This is important. As your condition improves, your dosage needs may change. You will need to have blood tests regularly so that your health care provider can watch your  condition. SEEK MEDICAL CARE IF:  Your symptoms do not get better with treatment.  You are taking thyroid replacement medicine and:  You sweat excessively.  You have tremors.  You feel anxious.  You lose weight rapidly.  You cannot tolerate heat.  You have emotional swings.  You have diarrhea.  You feel weak. SEEK IMMEDIATE MEDICAL CARE IF:   You develop chest pain.  You develop an irregular heartbeat.  You develop a rapid heartbeat.   This information is not intended to replace advice given to you by your health care provider. Make sure you discuss any questions you have with your health care provider.   Document Released: 03/07/2005 Document Revised: 03/28/2014 Document Reviewed: 07/23/2013 Elsevier Interactive Patient Education [employer]

## 2015-02-25 ENCOUNTER — Ambulatory Visit (INDEPENDENT_AMBULATORY_CARE_PROVIDER_SITE_OTHER): Payer: Medicaid Other | Admitting: Family Medicine

## 2015-02-25 ENCOUNTER — Encounter: Payer: Self-pay | Admitting: Family Medicine

## 2015-02-25 VITALS — BP 138/86 | HR 94 | Temp 96.6°F | Ht 61.0 in | Wt 261.2 lb

## 2015-02-25 DIAGNOSIS — R1033 Periumbilical pain: Secondary | ICD-10-CM | POA: Diagnosis not present

## 2015-02-25 DIAGNOSIS — E1142 Type 2 diabetes mellitus with diabetic polyneuropathy: Secondary | ICD-10-CM

## 2015-02-25 DIAGNOSIS — I10 Essential (primary) hypertension: Secondary | ICD-10-CM

## 2015-02-25 LAB — POCT GLYCOSYLATED HEMOGLOBIN (HGB A1C): HEMOGLOBIN A1C: 8.1

## 2015-02-25 NOTE — Progress Notes (Signed)
   HPI  Patient presents today . Follow-up for hypothyroidism, diabetes, hypertension.  Diabetes Good medication compliance, she states that her average fasting blood sugars been 150-175. Starting a couple weeks ago she states that her fasting blood sugar went up quite a bit to around 200, her postprandials around 250. Since that time she's begin much more aggressive diet control, she is also very happy with her recent weight loss. She states that she is decreasing her carbohydrate intake   Hypothyroidism She started Synthroid, denies any palpitations, sweats, increased anxiety. She's sleeping well. She has more energy and is happy with weight loss.  Hypertension Good medication compliance No chest pain, palpitations, leg edema.  Abdominal pain Over the last 4 days she's had 3 nights with sharp periumbilical abdominal pain is nonradiating and not associated with any other symptoms that has kept her from sleeping well. It stopped and was resolved last night, she denies fever, chills, diarrhea, emesis, nausea. She denies any other sick contacts.    PMH: Smoking status noted ROS: Per HPI  Objective: BP 138/86 mmHg  Pulse 94  Temp(Src) 96.6 F (35.9 C) (Oral)  Ht 5\' 1"  (1.549 m)  Wt 261 lb 3.2 oz (118.48 kg)  BMI 49.38 kg/m2 Gen: NAD, alert, cooperative with exam HEENT: NCAT, CV: RRR, good S1/S2, no murmur Resp: CTABL, no wheezes, non-labored Abd: SNTND, BS present, no guarding or organomegaly, obese Ext: No edema, warm Neuro: Alert and oriented, No gross deficits  Diabetic foot exam 2+ dorsalis pedis pulses, sensation intact to monofilament throughout, no lesions  Assessment and plan:  # Diabetes Control slipping slightly with A1c change from 7.4 too 8.1 She has had recent change in her diet control, encourage this and asked her to keep a close log for one month and come back for follow-up. With difficulty with weight gain consider changing her from either glipizide  or Actos to a GLP  # Hypertension Reasonable control No changes  # Subclinical hyperthyroidism Doing well on Synthroid Recheck TSH in 3 months of treatment, in 1 month follow-up  # Abdominal pain Abdominal exam reassuring, no red flag signs It has resolved I provided reassurance and welcome her to return to clinic if it comes back     Murtis SinkSam Bradshaw, MD [facility] 02/25/2015, 8:46 AM

## 2015-02-25 NOTE — Patient Instructions (Signed)
Great to see you!  Your glucose numbers are slightly worse, your A1C was 8.1 which equals an avg blood sugar of 186  If you abdominal pain continues or worsens please come back.   Lets see you back in 1 month to look at your diabetes again.

## 2015-03-20 LAB — HM DIABETES EYE EXAM

## 2015-03-26 ENCOUNTER — Encounter: Payer: Self-pay | Admitting: *Deleted

## 2015-03-27 ENCOUNTER — Encounter: Payer: Self-pay | Admitting: Family Medicine

## 2015-03-27 ENCOUNTER — Ambulatory Visit (INDEPENDENT_AMBULATORY_CARE_PROVIDER_SITE_OTHER): Payer: Medicaid Other | Admitting: Family Medicine

## 2015-03-27 VITALS — BP 142/81 | HR 97 | Temp 97.3°F | Ht 61.0 in | Wt 268.0 lb

## 2015-03-27 DIAGNOSIS — E038 Other specified hypothyroidism: Secondary | ICD-10-CM

## 2015-03-27 DIAGNOSIS — E1140 Type 2 diabetes mellitus with diabetic neuropathy, unspecified: Secondary | ICD-10-CM | POA: Diagnosis not present

## 2015-03-27 DIAGNOSIS — E039 Hypothyroidism, unspecified: Secondary | ICD-10-CM

## 2015-03-27 DIAGNOSIS — N938 Other specified abnormal uterine and vaginal bleeding: Secondary | ICD-10-CM

## 2015-03-27 DIAGNOSIS — N926 Irregular menstruation, unspecified: Secondary | ICD-10-CM | POA: Diagnosis not present

## 2015-03-27 LAB — POCT URINE PREGNANCY: Preg Test, Ur: NEGATIVE

## 2015-03-27 MED ORDER — GLUCOSE BLOOD VI STRP: ORAL_STRIP | Status: DC

## 2015-03-27 MED ORDER — ACCU-CHEK SOFTCLIX LANCET DEV MISC: Status: DC

## 2015-03-27 MED ORDER — ACCU-CHEK AVIVA PLUS W/DEVICE KIT: 1.0000 | PACK | Status: DC | PRN

## 2015-03-27 NOTE — Patient Instructions (Signed)
Great to see you!  Come back in 2 months to see me  Please see the pharmacist to start a new medicine within 1-2 weeks  Diet Recommendations for Diabetes   Starchy (carb) foods include: Bread, rice, pasta, potatoes, corn, crackers, bagels, muffins, all baked goods.   Protein foods include: Meat, fish, poultry, eggs, dairy foods, and beans such as pinto and kidney beans (beans also provide carbohydrate).   1. Eat at least 3 meals and 1-2 snacks per day. Never go more than 4-5 hours while awake without eating.  2. Limit starchy foods to TWO per meal and ONE per snack. ONE portion of a starchy  food is equal to the following:   - ONE slice of bread (or its equivalent, such as half of a hamburger bun).   - 1/2 cup of a "scoopable" starchy food such as potatoes or rice.   - 1 OUNCE (28 grams) of starchy snack foods such as crackers or pretzels (look on label).   - 15 grams of carbohydrate as shown on food label.  3. Both lunch and dinner should include a protein food, a carb food, and vegetables.   - Obtain twice as many veg's as protein or carbohydrate foods for both lunch and dinner.   - Try to keep frozen veg's on hand for a quick vegetable serving.     - Fresh or frozen veg's are best.  4. Breakfast should always include protein.

## 2015-03-27 NOTE — Progress Notes (Signed)
   HPI  Patient presents today for diabetes and discuss missed period .  Patient explains that she had her last normal period in October, around October 5, on November 6 she had one day of light bleeding that then went away. She has been sexually active without condoms She states that her sister went through premature menopause and suspects that this is happening her as well.  Diabetes She feels more in control of her diabetes since keeping track with a walker. She's been watching her diet and has more energy. Her fasting blood sugar has ranged from 120-10, her average is around 160. Postprandial has ranged 90-250, average is 180. No hypoglycemia She is struggling with her weight   PMH: Smoking status noted ROS: Per HPI  Objective: BP 142/81 mmHg  Pulse 97  Temp(Src) 97.3 F (36.3 C) (Oral)  Ht _0  (1.549 m)  Wt 268 lb (121.564 kg)  BMI 50.66 kg/m2 Gen: NAD, alert, cooperative with exam HEENT: NCAT CV: RRR, good S1/S2, no murmur Resp: CTABL, no wheezes, non-labored Ext: No edema, warm Neuro: Alert and oriented, No gross deficits  Assessment and plan:  # Type 2 diabetes Uncontrolled, last A1c elevated Considering her difficulty with weight I think it's most appropriate that she changes from glipizide to weekly GLP I have recommended that she follow-up with our clinical pharmacist to learn how to do the injections and start the medication. I have represcribed her glucometer and testing supplies today. I have also encouraged her to check only once or twice daily. For now continue metformin, Actos, glipizide, she is only on 10 mg once daily glipizide.  # Dysfunctional uterine bleeding Missed. 2 months, it is possible she is going through premature menopause, however with obesity and diabetes and suspicious of PCOS Monitor further periods No intervention for now Urine pregnancy test is negative    Orders Placed This Encounter  Procedures  . POCT urine pregnancy      Meds ordered this encounter  Medications  . glucose blood (ACCU-CHEK AVIVA) test strip    Sig: Use as instructed    Dispense:  100 each    Refill:  12  . Lancet Devices (ACCU-CHEK SOFTCLIX) lancets    Sig: Use as instructed    Dispense:  1 each    Refill:  0  . Blood Glucose Monitoring Suppl (ACCU-CHEK AVIVA PLUS) w/Device KIT    Sig: 1 Device by Does not apply route as needed.    Dispense:  1 kit    Refill:  0    Laroy Apple, MD [facility] 03/27/2015, 8:27 AM

## 2015-03-28 LAB — TSH: TSH: 3.78 u[IU]/mL (ref 0.450–4.500)

## 2015-04-03 ENCOUNTER — Ambulatory Visit (INDEPENDENT_AMBULATORY_CARE_PROVIDER_SITE_OTHER): Payer: Medicaid Other | Admitting: Pharmacist

## 2015-04-03 ENCOUNTER — Encounter: Payer: Self-pay | Admitting: Pharmacist

## 2015-04-03 ENCOUNTER — Telehealth: Payer: Self-pay | Admitting: Pharmacist

## 2015-04-03 VITALS — BP 136/88 | HR 82 | Ht 61.0 in | Wt 267.0 lb

## 2015-04-03 DIAGNOSIS — E1140 Type 2 diabetes mellitus with diabetic neuropathy, unspecified: Secondary | ICD-10-CM

## 2015-04-03 MED ORDER — ALBIGLUTIDE 30 MG ~~LOC~~ PEN: 30.0000 mg | PEN_INJECTOR | SUBCUTANEOUS | Status: DC

## 2015-04-03 NOTE — Telephone Encounter (Signed)
Spoke with mother, insurance problems have been fixed - [facility] was trying to file with Medicare which patient does not have.

## 2015-04-03 NOTE — Patient Instructions (Addendum)
Start Tanzeum 30 mg - injection once weekly Decrease glipizide to 1/2 tablet daily for the next 7 days and then stop.   Diabetes and Standards of Medical Care   Diabetes is complicated. You may find that your diabetes team includes a dietitian, nurse, diabetes educator, eye doctor, and more. To help everyone know what is going on and to help you get the care you deserve, the following schedule of care was developed to help keep you on track. Below are the tests, exams, vaccines, medicines, education, and plans you will need.  Blood Glucose Goals Prior to meals = 80 - 130 Within 2 hours of the start of a meal = less than 180  HbA1c test (goal is less than 7.0% - your last value was 8.1%) This test shows how well you have controlled your glucose over the past 2 to 3 months. It is used to see if your diabetes management plan needs to be adjusted.   It is performed at least 2 times a year if you are meeting treatment goals.  It is performed 4 times a year if therapy has changed or if you are not meeting treatment goals.  Blood pressure test  This test is performed at every routine medical visit. The goal is less than 140/90 mmHg for most people, but 130/80 mmHg in some cases. Ask your health care provider about your goal.  Dental exam  Follow up with the dentist regularly.  Eye exam  If you are diagnosed with type 1 diabetes as a child, get an exam upon reaching the age of 66 years or older and have had diabetes for 3 to 5 years. Yearly eye exams are recommended after that initial eye exam.  If you are diagnosed with type 1 diabetes as an adult, get an exam within 5 years of diagnosis and then yearly.  If you are diagnosed with type 2 diabetes, get an exam as soon as possible after the diagnosis and then yearly.  Foot care exam  Visual foot exams are performed at every routine medical visit. The exams check for cuts, injuries, or other problems with the feet.  A comprehensive foot  exam should be done yearly. This includes visual inspection as well as assessing foot pulses and testing for loss of sensation.  Check your feet nightly for cuts, injuries, or other problems with your feet. Tell your health care provider if anything is not healing.  Kidney function test (urine microalbumin)  This test is performed once a year.  Type 1 diabetes: The first test is performed 5 years after diagnosis.  Type 2 diabetes: The first test is performed at the time of diagnosis.  A serum creatinine and estimated glomerular filtration rate (eGFR) test is done once a year to assess the level of chronic kidney disease (CKD), if present.  Lipid profile (cholesterol, HDL, LDL, triglycerides)  Performed every 5 years for most people.  The goal for LDL is less than 100 mg/dL. If you are at high risk, the goal is less than 70 mg/dL.  The goal for HDL is 40 mg/dL to 50 mg/dL for men and 50 mg/dL to 60 mg/dL for women. An HDL cholesterol of 60 mg/dL or higher gives some protection against heart disease.  The goal for triglycerides is less than 150 mg/dL.  Influenza vaccine, pneumococcal vaccine, and hepatitis B vaccine  The influenza vaccine is recommended yearly.  The pneumococcal vaccine is generally given once in a lifetime. However, there are some instances  when another vaccination is recommended. Check with your health care provider.  The hepatitis B vaccine is also recommended for adults with diabetes.  Diabetes self-management education  Education is recommended at diagnosis and ongoing as needed.  Treatment plan  Your treatment plan is reviewed at every medical visit.  Document Released: 01/02/2009 Document Revised: 11/07/2012 Document Reviewed: 08/07/2012 [facility] Patient Information 2014 [place].

## 2015-04-03 NOTE — Progress Notes (Signed)
Subjective:    Diane BrunswickRhonda Morgan is a 45 y.o. female who presents for an initial evaluation of Type 2 diabetes mellitus and diabetes education.   Current symptoms/problems include hyperglycemia, paresthesia of the feet and polydipsia and have been unchanged.  The patient was initially diagnosed with Type 2 diabetes mellitus about 45yo  Known diabetic complications: peripheral neuropathy Cardiovascular risk factors: diabetes mellitus, dyslipidemia, family history of premature cardiovascular disease, obesity (BMI >= 30 kg/m2) and sedentary lifestyle Current diabetic medications include glipizide 10mg  qd, metformin 1000mg  bid, pioglitazone 30mg  qd.   Eye exam current (within one year): yes Weight trend: stable Prior visit with dietician: no Current diet: in general, an "unhealthy" diet Current exercise: none  Current monitoring regimen: none - patient is in need of new glucometer [facility] has not [facility] yet. Home blood sugar records: none Any episodes of hypoglycemia? no  Is She on ACE inhibitor or angiotensin II receptor blocker?  Yes  lisinopril (Prinivil)    The following portions of the patient's history were reviewed and updated as appropriate: allergies, current medications, past family history, past medical history, past social history, past surgical history and problem list.  Review of  Systems Review of Systems  Constitutional: Negative.   HENT: Negative.   Eyes: Negative.   Neurological: Negative.   Endo/Heme/Allergies: Negative.     Objective:    BP 136/88 mmHg  Pulse 82  Ht 5\' 1"  (1.549 m)  Wt 267 lb (121.11 kg)  BMI 50.48 kg/m2  A1c = 8.1% (02/25/2015) Lab Review GLUCOSE (mg/dL)  Date Value  11/91/478208/26/2016 229*  02/19/2014 216*  06/19/2013 94   GLUCOSE, BLD (mg/dL)  Date Value  95/62/130809/14/2014 174*  12/01/2012 197*  11/30/2012 179*   CO2 (mmol/L)  Date Value  11/14/2014 19  02/19/2014 21  06/19/2013 20   BUN  (mg/dL)  Date Value  65/78/469608/26/2016 18  02/19/2014 15  06/19/2013 11  12/02/2012 12  12/01/2012 10  11/30/2012 12   CREAT (mg/dL)  Date Value  29/52/841305/30/2014 1.13*   CREATININE, SER (mg/dL)  Date Value  24/40/102708/26/2016 1.09*  02/19/2014 1.03*  06/19/2013 0.88   Assessment:    Diabetes Mellitus type II, under inadequate control.   Obesity HTN - BP at goal today   Plan:    1.  Rx changes: start Tanzeum 30mg  SQ weekly.    - should help with both BG and weight  Will also try to taper down and eventually off glipizide - decrease to 5mg  or 1/2 tablet of 10mg  once daily.   2.  Education: Reviewed 'ABCs' of diabetes management (respective goals in parentheses):  A1C (<7), blood pressure (<130/80), and cholesterol (LDL <100). 3.  Called [facility] and they need patient to bring by new insurance card to process new Rx for glucometer.  Reviewed BG goals and patient is to check BG 2 to 3 times a day 4.  Discussed CHO counting diet in depth.  Recommend 45 or fewer CHO per meal and 20 grams or few per snack. Discussed CHO serving size recommendations. 5. Discussed increasing physical activity   - goal is 30 minutes daily for 4 or more days per week 6. Follow up: 1 month     Henrene Pastorammy Teila Skalsky, PharmD, CPP, CDE

## 2015-05-08 ENCOUNTER — Ambulatory Visit (INDEPENDENT_AMBULATORY_CARE_PROVIDER_SITE_OTHER): Payer: Medicaid Other | Admitting: Pharmacist

## 2015-05-08 ENCOUNTER — Encounter: Payer: Self-pay | Admitting: Pharmacist

## 2015-05-08 VITALS — BP 134/78 | HR 76 | Ht 61.0 in | Wt 264.0 lb

## 2015-05-08 DIAGNOSIS — E1140 Type 2 diabetes mellitus with diabetic neuropathy, unspecified: Secondary | ICD-10-CM

## 2015-05-08 NOTE — Patient Instructions (Signed)
Diabetes and Standards of Medical Care   Diabetes is complicated. You may find that your diabetes team includes a dietitian, nurse, diabetes educator, eye doctor, and more. To help everyone know what is going on and to help you get the care you deserve, the following schedule of care was developed to help keep you on track. Below are the tests, exams, vaccines, medicines, education, and plans you will need.  Blood Glucose Goals Prior to meals = 80 - 130 Within 2 hours of the start of a meal = less than 180  HbA1c test (goal is less than 7.0% - your last value was 8.1%) This test shows how well you have controlled your glucose over the past 2 to 3 months. It is used to see if your diabetes management plan needs to be adjusted.   It is performed at least 2 times a year if you are meeting treatment goals.  It is performed 4 times a year if therapy has changed or if you are not meeting treatment goals.  Blood pressure test  This test is performed at every routine medical visit. The goal is less than 140/90 mmHg for most people, but 130/80 mmHg in some cases. Ask your health care provider about your goal.  Dental exam  Follow up with the dentist regularly.  Eye exam  If you are diagnosed with type 1 diabetes as a child, get an exam upon reaching the age of 38 years or older and have had diabetes for 3 to 5 years. Yearly eye exams are recommended after that initial eye exam.  If you are diagnosed with type 1 diabetes as an adult, get an exam within 5 years of diagnosis and then yearly.  If you are diagnosed with type 2 diabetes, get an exam as soon as possible after the diagnosis and then yearly.  Foot care exam  Visual foot exams are performed at every routine medical visit. The exams check for cuts, injuries, or other problems with the feet.  A comprehensive foot exam should be done yearly. This includes visual inspection as well as assessing foot pulses and testing for loss of  sensation.  Check your feet nightly for cuts, injuries, or other problems with your feet. Tell your health care provider if anything is not healing.  Kidney function test (urine microalbumin)  This test is performed once a year.  Type 1 diabetes: The first test is performed 5 years after diagnosis.  Type 2 diabetes: The first test is performed at the time of diagnosis.  A serum creatinine and estimated glomerular filtration rate (eGFR) test is done once a year to assess the level of chronic kidney disease (CKD), if present.  Lipid profile (cholesterol, HDL, LDL, triglycerides)  Performed every 5 years for most people.  The goal for LDL is less than 100 mg/dL. If you are at high risk, the goal is less than 70 mg/dL.  The goal for HDL is 40 mg/dL to 50 mg/dL for men and 50 mg/dL to 60 mg/dL for women. An HDL cholesterol of 60 mg/dL or higher gives some protection against heart disease.  The goal for triglycerides is less than 150 mg/dL.  Influenza vaccine, pneumococcal vaccine, and hepatitis B vaccine  The influenza vaccine is recommended yearly.  The pneumococcal vaccine is generally given once in a lifetime. However, there are some instances when another vaccination is recommended. Check with your health care provider.  The hepatitis B vaccine is also recommended for adults with diabetes.  Diabetes self-management education  Education is recommended at diagnosis and ongoing as needed.  Treatment plan  Your treatment plan is reviewed at every medical visit.  Document Released: 01/02/2009 Document Revised: 11/07/2012 Document Reviewed: 08/07/2012 Exit[employer] Patient Information 2014 [employer].   

## 2015-05-08 NOTE — Progress Notes (Signed)
Subjective:    Diane Morgan is a 45 y.o. female who presents for re-evaluation of Type 2 diabetes mellitus and diabetes education.   I last saw patient 1 month ago and started Tanzem  SQ weekly. She is tolerating well with little nausea or other GI complaints.  She does mention that her appetite has decreased and she is eating smaller portions.   Other current diabetic medications include glipizide  qd (we tried to decrease to  daily but patient states BG increased), metformin  bid, pioglitazone  qd.  Diet - she has made great changes - increase in water, decrease in serving sizes and limiting sugar and high CHO food intake.  Has increased non starchy vegetable intake.  The patient was initially diagnosed with Type 2 diabetes mellitus about 45yo  Known diabetic complications: peripheral neuropathy Cardiovascular risk factors: diabetes mellitus, dyslipidemia, family history of premature cardiovascular disease, obesity (BMI >= 30 kg/m2) and sedentary lifestyle  Eye exam current (within one year): yes Weight trend: decreased by 4# since 03/27/2015 Exercise - reports more walking and goes to gym 1-2 times per week.  Current monitoring regimen:  Patient is checking 1 to 2 times per day. Home blood sugar records: BG ranges from 113 to 150. Any episodes of hypoglycemia? no  Is She on ACE inhibitor or angiotensin II receptor blocker?  Yes  lisinopril (Prinivil)    The following portions of the patient's history were reviewed and updated as appropriate: allergies, current medications, past family history, past medical history, past social history, past surgical history and problem list.  Review of  Systems Review of Systems  Constitutional: Negative.   HENT: Negative.   Eyes: Negative.   Gastrointestinal: Positive for nausea (but not very often - only when overeats).  Neurological: Negative.   Endo/Heme/Allergies: Negative.     Objective:    BP 134/78 mmHg  Pulse  76  Ht  (1.549 m)  Wt 264 lb (119.75 kg)  BMI 49.91 kg/m2  A1c = 8.1% (02/25/2015)  Lab Review GLUCOSE (mg/dL)  Date Value  11/91/4782 229*  02/19/2014 216*  06/19/2013 94   GLUCOSE, BLD (mg/dL)  Date Value  95/62/1308 174*  12/01/2012 197*  11/30/2012 179*   CO2 (mmol/L)  Date Value  11/14/2014 19  02/19/2014 21  06/19/2013 20   BUN (mg/dL)  Date Value  65/78/4696 18  02/19/2014 15  06/19/2013 11  12/02/2012 12  12/01/2012 10  11/30/2012 12   CREAT (mg/dL)  Date Value  29/52/8413 1.13*   CREATININE, SER (mg/dL)  Date Value  24/40/1027 1.09*  02/19/2014 1.03*  06/19/2013 0.88   Assessment:    Diabetes Mellitus type II, under improving control.   Obesity - has lost 4# over the last 6 weeks with diet, exercise and medication changes HTN - BP at goal today   Plan:    1.  Rx changes: Continue Tanzem  SQ weekly.  Discussed increasing to  but patient would like to hold off until next month when A1c is checked.  I think this is ok but if tolerated in the future I feel Tanzem  will help more with both weight and BG. 2.  Education: Reviewed 'ABCs' of diabetes management (respective goals in parentheses):  A1C (<7), blood pressure (<130/80), and cholesterol (LDL <100). 3.  Reviewed BG goals and patient is to continue tocheck BG 1 to 2  times a day 4.  Reviewed CHO counting.  Patient is commended on changes she has made to daily diet.  5. Continue to increase physical activity   - goal is 30 minutes daily for 5 or more days per week 6. Follow up: 1 month  with PCP and May 2017 with me/CDE   Henrene Pastor, PharmD, CPP, CDE

## 2015-05-25 ENCOUNTER — Other Ambulatory Visit: Payer: Self-pay | Admitting: Family Medicine

## 2015-05-29 ENCOUNTER — Ambulatory Visit: Payer: Medicaid Other | Admitting: Family Medicine

## 2015-05-29 ENCOUNTER — Other Ambulatory Visit: Payer: Self-pay | Admitting: Pharmacist

## 2015-06-02 ENCOUNTER — Encounter: Payer: Self-pay | Admitting: Family Medicine

## 2015-06-02 ENCOUNTER — Ambulatory Visit (INDEPENDENT_AMBULATORY_CARE_PROVIDER_SITE_OTHER): Payer: Medicaid Other | Admitting: Family Medicine

## 2015-06-02 VITALS — BP 137/73 | HR 89 | Temp 97.1°F | Ht 61.0 in | Wt 253.4 lb

## 2015-06-02 DIAGNOSIS — E785 Hyperlipidemia, unspecified: Secondary | ICD-10-CM

## 2015-06-02 DIAGNOSIS — E1140 Type 2 diabetes mellitus with diabetic neuropathy, unspecified: Secondary | ICD-10-CM | POA: Diagnosis not present

## 2015-06-02 DIAGNOSIS — E1142 Type 2 diabetes mellitus with diabetic polyneuropathy: Secondary | ICD-10-CM

## 2015-06-02 DIAGNOSIS — G5602 Carpal tunnel syndrome, left upper limb: Secondary | ICD-10-CM | POA: Insufficient documentation

## 2015-06-02 DIAGNOSIS — D509 Iron deficiency anemia, unspecified: Secondary | ICD-10-CM | POA: Diagnosis not present

## 2015-06-02 LAB — CMP14+EGFR
A/G RATIO: 1.8 (ref 1.2–2.2)
ALBUMIN: 3.7 g/dL (ref 3.5–5.5)
ALT: 9 IU/L (ref 0–32)
AST: 11 IU/L (ref 0–40)
Alkaline Phosphatase: 90 IU/L (ref 39–117)
BILIRUBIN TOTAL: 0.4 mg/dL (ref 0.0–1.2)
BUN / CREAT RATIO: 19 (ref 9–23)
BUN: 18 mg/dL (ref 6–24)
CHLORIDE: 103 mmol/L (ref 96–106)
CO2: 21 mmol/L (ref 18–29)
Calcium: 8.3 mg/dL — ABNORMAL LOW (ref 8.7–10.2)
Creatinine, Ser: 0.96 mg/dL (ref 0.57–1.00)
GFR, EST AFRICAN AMERICAN: 84 mL/min/{1.73_m2} (ref >59)
GFR, EST NON AFRICAN AMERICAN: 73 mL/min/{1.73_m2} (ref >59)
GLOBULIN, TOTAL: 2.1 g/dL (ref 1.5–4.5)
Glucose: 124 mg/dL — ABNORMAL HIGH (ref 65–99)
POTASSIUM: 4.5 mmol/L (ref 3.5–5.2)
SODIUM: 140 mmol/L (ref 134–144)
Total Protein: 5.8 g/dL — ABNORMAL LOW (ref 6.0–8.5)

## 2015-06-02 LAB — CBC
HEMOGLOBIN: 10.8 g/dL — AB (ref 11.1–15.9)
Hematocrit: 34.6 % (ref 34.0–46.6)
MCH: 23.3 pg — AB (ref 26.6–33.0)
MCHC: 31.2 g/dL — AB (ref 31.5–35.7)
MCV: 75 fL — ABNORMAL LOW (ref 79–97)
Platelets: 327 10*3/uL (ref 150–379)
RBC: 4.63 x10E6/uL (ref 3.77–5.28)
RDW: 18.4 % — AB (ref 12.3–15.4)
WBC: 7.8 10*3/uL (ref 3.4–10.8)

## 2015-06-02 LAB — BAYER DCA HB A1C WAIVED: HB A1C: 7.2 % — AB (ref <7.0)

## 2015-06-02 NOTE — Patient Instructions (Addendum)
Great to see you today!  You have made great improvements in your diabetic control! Keep it up!  Schedule a Pap before leaving today with one of the female providers.   See Tammy again in 2 months

## 2015-06-02 NOTE — Progress Notes (Signed)
   HPI  Patient presents today here for follow-up diabetes, left hand numbness, and weight loss.  Obesity and diabetes Patient has made very good lifestyle changes recently, she is eating vegetables more often, she is tolerating TAnzeum well Also taking glipizide, Actos, and metformin.  Left hand numbness Patient explains she has left hand numbness in her fingertips whenever she does a prolonged activity like: Remote control for several minutes. This is been going on for a few months.  Hyperlipidemia No problems with atorvastatin Watching her diet as above  PMH: Smoking status noted ROS: Per HPI  Objective: BP 137/73 mmHg  Pulse 89  Temp(Src) 97.1 F (36.2 C) (Oral)  Ht 5\' 1"  (1.549 m)  Wt 253 lb 6.4 oz (114.941 kg)  BMI 47.90 kg/m2 Gen: NAD, alert, cooperative with exam HEENT: NCAT CV: RRR, good S1/S2, no murmur Resp: CTABL, no wheezes, non-labored Ext: No edema, warm Neuro: Alert and oriented, No gross deficits Positive Phalen's test on the left hand  Diabetic foot exam 2+ dorsalis pedis pulses, no lesions, sensation intact to monofilament throughout  Assessment and plan:  # Type 2 diabetes Control improving, congratulated A1c trending from 8.1-7.2 today. Continue metformin, Actos, glipizide, tanzeum She's also made good lifestyle changes, encouraged  # Iron deficiency anemia Tolerating iron pills easily Repeat CBC  # Hyperlipidemia Tolerating Lipitor well Annual lipids, next is due in December  # Healthcare maintenance Encouraged Pap smear, she will schedule with a female provider  Left hand carpal tunnel syndrome Discussed cockup splints Follow-up as needed  Orders Placed This Encounter  Procedures  . Bayer DCA Hb A1c Waived    No orders of the defined types were placed in this encounter.    Murtis SinkSam Kagan Hietpas, MD [facility] 06/02/2015, 9:20 AM

## 2015-06-03 ENCOUNTER — Other Ambulatory Visit: Payer: Self-pay | Admitting: Family Medicine

## 2015-06-17 ENCOUNTER — Encounter: Payer: Self-pay | Admitting: Pediatrics

## 2015-06-17 ENCOUNTER — Ambulatory Visit (INDEPENDENT_AMBULATORY_CARE_PROVIDER_SITE_OTHER): Payer: Medicaid Other | Admitting: Pediatrics

## 2015-06-17 VITALS — BP 131/69 | HR 94 | Temp 97.1°F | Ht 61.0 in | Wt 251.0 lb

## 2015-06-17 DIAGNOSIS — Z1239 Encounter for other screening for malignant neoplasm of breast: Secondary | ICD-10-CM | POA: Diagnosis not present

## 2015-06-17 DIAGNOSIS — Z01419 Encounter for gynecological examination (general) (routine) without abnormal findings: Secondary | ICD-10-CM

## 2015-06-17 NOTE — Progress Notes (Signed)
    Subjective:    Patient ID: Diane Morgan, female    DOB: 1971/01/11, 45 y.o.   MRN: [id number]  CC: Gynecologic Exam   HPI: Diane Morgan is a 45 y.o. female presenting for Gynecologic Exam   one prior pap smear years ago, she says she thinks was normal Periods have been getting lighter past few years One sexual partner last yrs, not currently sexually active No h/o STI No vaginal discharge Not interested in STi testing No pregnancies   Dep[facility] screen [facility] 06/17/2015 06/02/2015 03/27/2015 02/25/2015 12/26/2014  Decreased Interest 0 0 0 0 2  Down, Depressed, Hopeless 0 0 0 0 0  PHQ - 2 Score 0 0 0 0 2     Relevant past medical, surgical, family and social history reviewed and updated as indicated. Interim medical history since our last visit reviewed. Allergies and medications reviewed and updated.    ROS: Per HPI unless specifically indicated above  History  Smoking status  . Never Smoker   Smokeless tobacco  . Never Used    Past Medical History Patient Active Problem List   Diagnosis Date Noted  . Carpal tunnel syndrome on left 06/02/2015  . DUB (dysfunctional uterine bleeding) 03/27/2015  . Subclinical hypothyroidism 12/26/2014  . Anemia, iron deficiency 11/17/2014  . Cellulitis and abscess 11/28/2012  . GERD (gastroesophageal reflux disease) 11/28/2012  . Hypertension 08/17/2012  . Hyperlipidemia 08/17/2012  . Morbid obesity (HCC) 08/17/2012  . T2DM (type 2 diabetes mellitus) (HCC) 08/17/2012  . Diabetic neuropathy, type II diabetes mellitus (HCC) 08/17/2012        Objective:    BP 131/69 mmHg  Pulse 94  Temp(Src) 97.1 F (36.2 C) (Oral)  Ht 5\' 1"  (1.549 m)  Wt 251 lb (113.853 kg)  BMI 47.45 kg/m2  LMP 06/03/2015  Wt Readings from Last 3 Encounters:  06/17/15 251 lb (113.853 kg)  06/02/15 253 lb 6.4 oz (114.941 kg)  05/08/15 264 lb (119.75 kg)     Gen: NAD, alert, cooperative with exam, NCAT EYES: EOMI, no scleral injection or  icterus ENT:  OP without erythema LYMPH: no cervical LAD CV: NRRR, normal S1/S2, no murmur Resp: CTABL, no wheezes, normal WOB Abd: +BS, soft, NTND.  Neuro: Alert and oriented GU: normal external female genitalia, normal appearing cervix, slightly friable Breast: declined     Assessment & Plan:    Diane Morgan was seen today for gynecologic exam.  Diagnoses and all orders for this visit:  Encounter for gynecological examination -     Pap IG and HPV (high risk) DNA detection  Breast cancer screening -     MM DIGITAL SCREENING BILATERAL; Future     Follow up plan: Return for needs mammogram on bus appt, then RTC as scheduled with Dr. Ermalinda MemosBradshaw.  Rex Krasarol Vincent, MD [facility] 06/17/2015, 12:31 PM

## 2015-06-23 ENCOUNTER — Other Ambulatory Visit: Payer: Self-pay | Admitting: Family Medicine

## 2015-06-23 LAB — PAP IG AND HPV HIGH-RISK
HPV, high-risk: POSITIVE — AB
PAP Smear Comment: 0

## 2015-06-26 ENCOUNTER — Other Ambulatory Visit: Payer: Self-pay | Admitting: *Deleted

## 2015-06-26 ENCOUNTER — Telehealth: Payer: Self-pay | Admitting: *Deleted

## 2015-06-26 ENCOUNTER — Other Ambulatory Visit: Payer: Self-pay | Admitting: Nurse Practitioner

## 2015-06-26 DIAGNOSIS — B977 Papillomavirus as the cause of diseases classified elsewhere: Secondary | ICD-10-CM

## 2015-06-26 DIAGNOSIS — R87610 Atypical squamous cells of undetermined significance on cytologic smear of cervix (ASC-US): Secondary | ICD-10-CM

## 2015-06-26 DIAGNOSIS — IMO0002 Reserved for concepts with insufficient information to code with codable children: Secondary | ICD-10-CM

## 2015-06-26 NOTE — Telephone Encounter (Signed)
Pt is calling wanting results of pap smear, I havent called her yet as I wanted you to review first. 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 I have the referral for gyn waiting for you to sign. I didn't want to sign it until you looked to make sure it was accurate. Please send call back to me and I will call the pt. She was seen by Dr.Vincent.

## 2015-06-29 IMAGING — CT CT ABD-PELV W/O CM
2 of 4 series · 17 of 46 positions shown, 19 images · non-contrast
Comparison: None.

CLINICAL DATA: Question abdominal wall abscess. Left lower quadrant
induration.

EXAM:
CT ABDOMEN AND PELVIS WITHOUT CONTRAST
TECHNIQUE: Multidetector CT imaging of the abdomen and pelvis was performed
following the standard protocol without intravenous contrast.

[Series 2: abd/ pelvis 5.0 i30f 1 · axial · 0.96mm/px · z∈[-664,-229]mm · 14 of 95 slices shown, 16 images]
[im 4/95  soft-tissue]
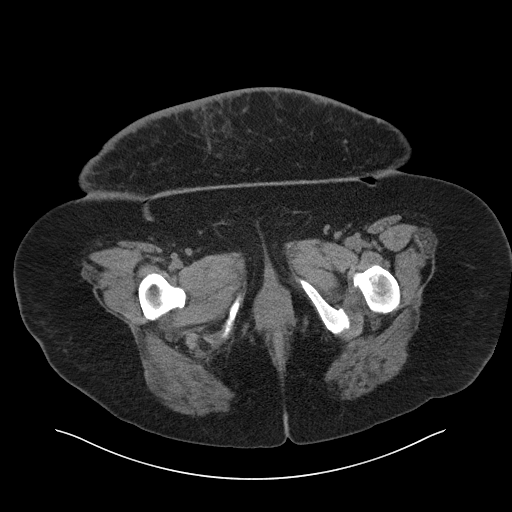
[im 4/95  bone]
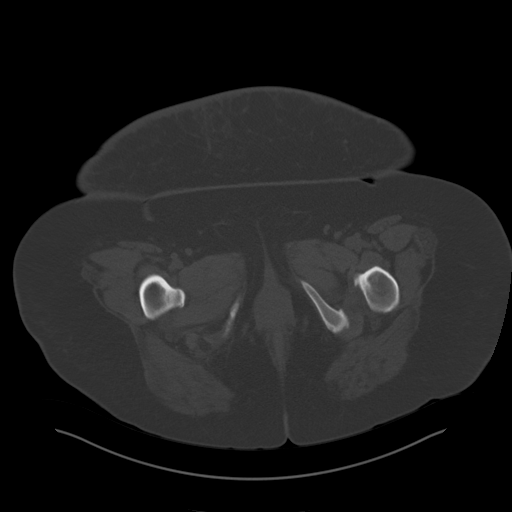
[im 12/95  soft-tissue]
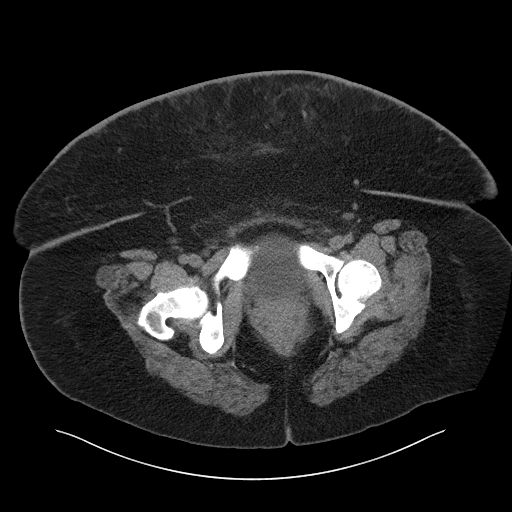
[im 20/95  soft-tissue]
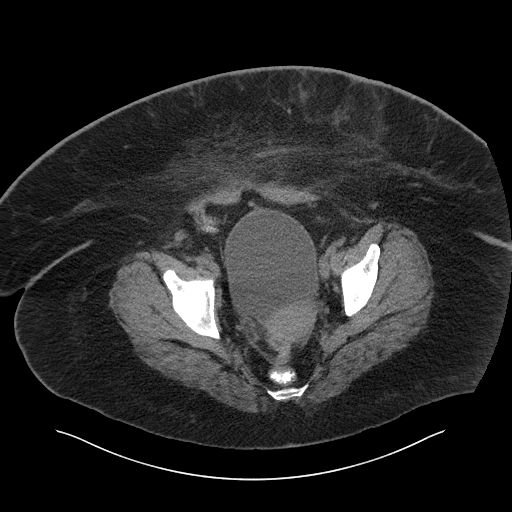
[im 24/95  soft-tissue]
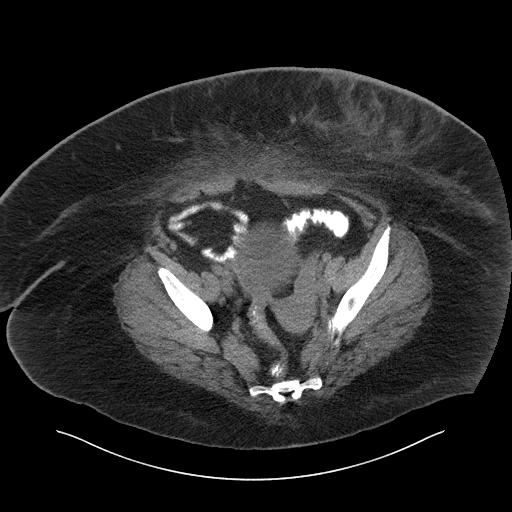
[im 32/95  soft-tissue]
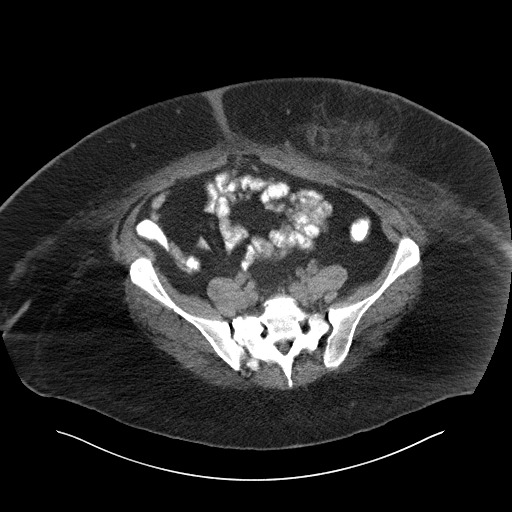
[im 40/95  soft-tissue]
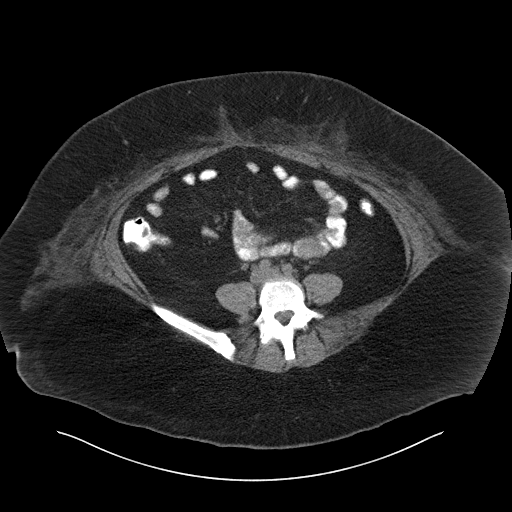
[im 44/95  soft-tissue]
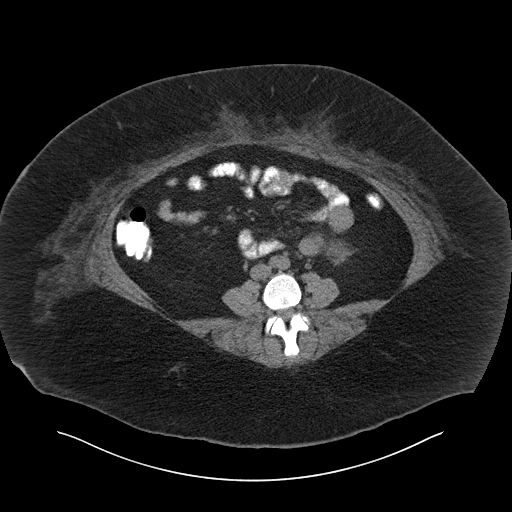
[im 51/95  soft-tissue]
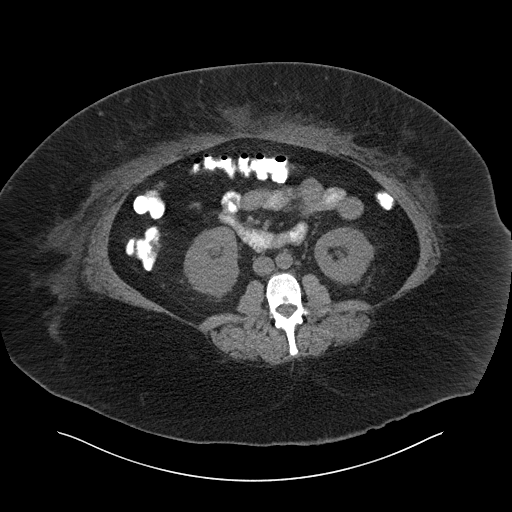
[im 55/95  soft-tissue]
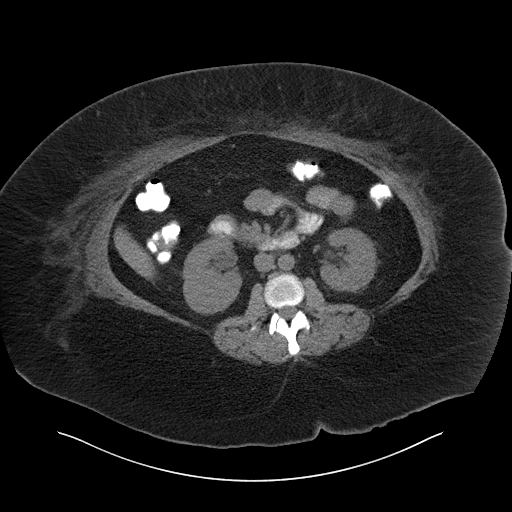
[im 55/95  bone]
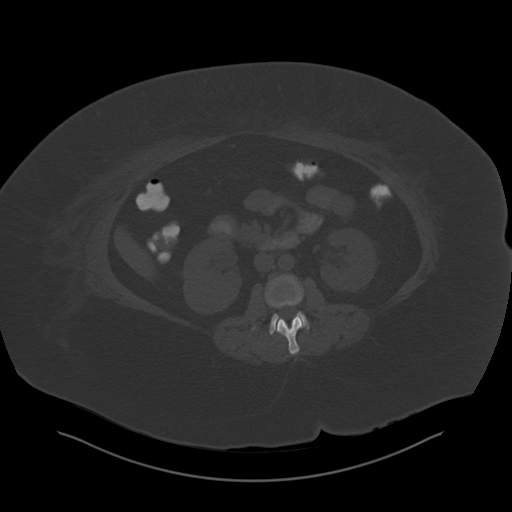
[im 63/95  soft-tissue]
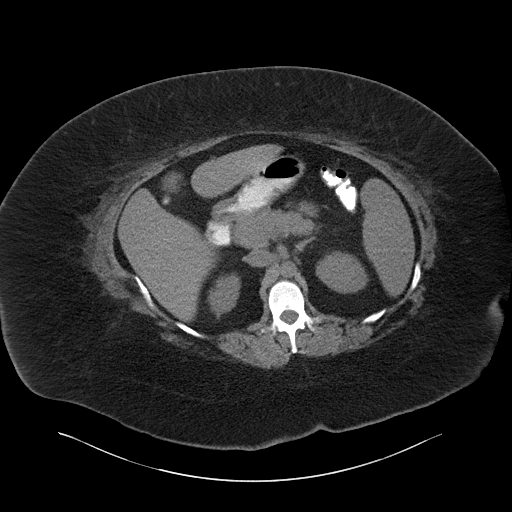
[im 71/95  soft-tissue]
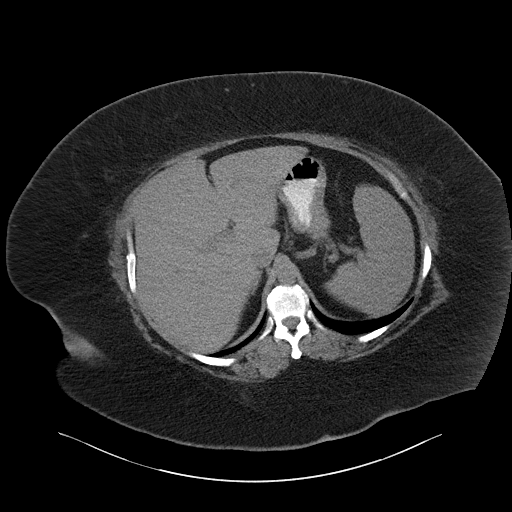
[im 75/95  soft-tissue]
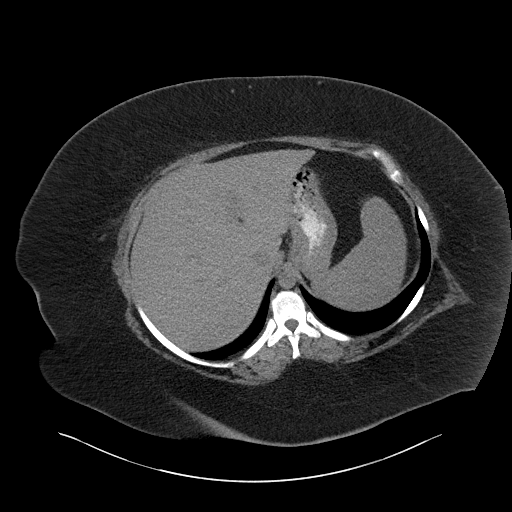
[im 83/95  soft-tissue]
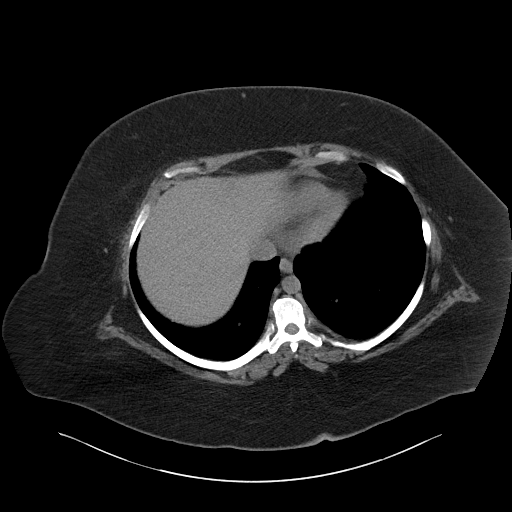
[im 91/95  soft-tissue]
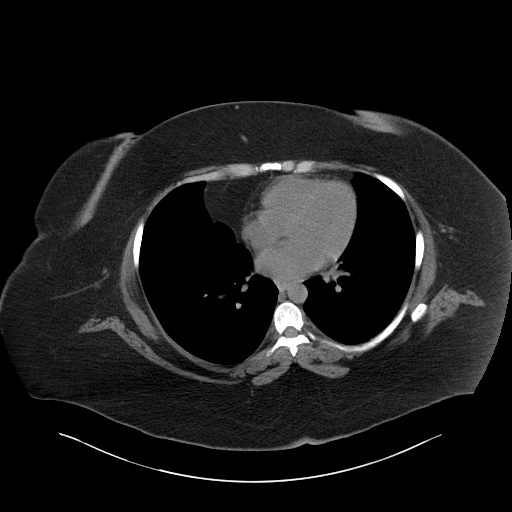

[Series 5: cor st · coronal · 0.88mm/px · 3 of 94 slices shown]
[im 32/94  soft-tissue]
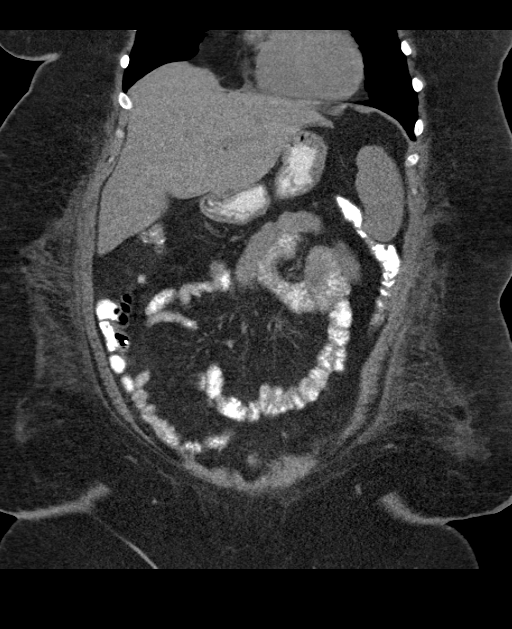
[im 42/94  soft-tissue]
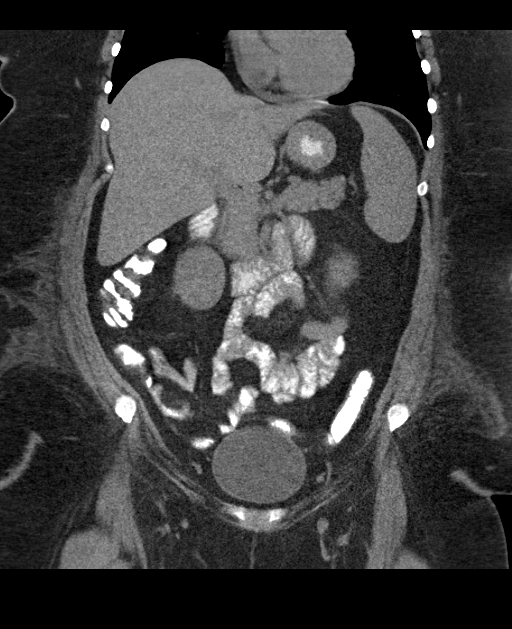
[im 52/94  soft-tissue]
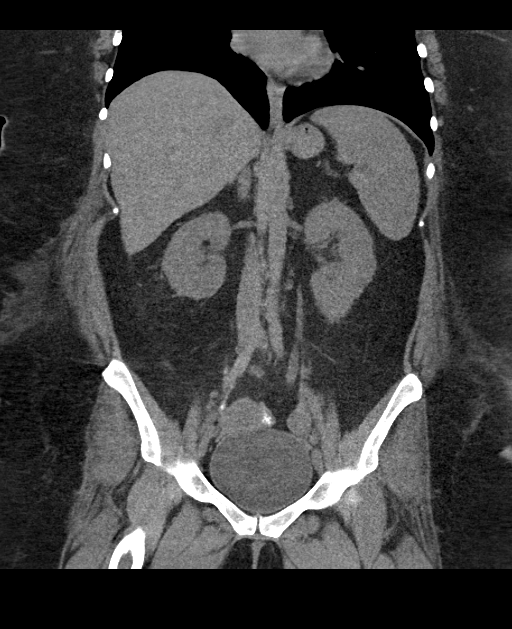

[17 of 46 positions shown; findings below may reference images not displayed]

FINDINGS: Lung bases are clear. No effusions. Heart is normal size. There is
stranding/ edema throughout the subcutaneous soft tissues of the
abdominal and pelvic wall anteriorly and laterally. Within the left
lower quadrant, more pronounced stranding is noted without focal
fluid collection.

Appendix is visualized and is normal. Prior cholecystectomy. Liver,
spleen, pancreas, adrenals and kidneys have an unremarkable
unenhanced appearance. Uterus, adnexae and urinary bladder are
unremarkable. Stomach, large and small bowel grossly unremarkable.

No acute bony abnormality.
IMPRESSION: Stranding within the subcutaneous soft tissues, most pronounced in
the left lower quadrant. No focal fluid collection to suggest
abscess. Findings could reflect cellulitis/panniculitis.

## 2015-07-17 ENCOUNTER — Encounter: Payer: Self-pay | Admitting: Obstetrics & Gynecology

## 2015-07-17 ENCOUNTER — Ambulatory Visit (INDEPENDENT_AMBULATORY_CARE_PROVIDER_SITE_OTHER): Payer: Medicaid Other | Admitting: Obstetrics & Gynecology

## 2015-07-17 VITALS — BP 120/80 | HR 72 | Ht 61.0 in | Wt 242.0 lb

## 2015-07-17 DIAGNOSIS — R896 Abnormal cytological findings in specimens from other organs, systems and tissues: Secondary | ICD-10-CM

## 2015-07-17 DIAGNOSIS — IMO0002 Reserved for concepts with insufficient information to code with codable children: Secondary | ICD-10-CM

## 2015-07-17 NOTE — Progress Notes (Signed)
Patient ID: Diane BrunswickRhonda Calixto, female   DOB: December 01, 1970, 45 y.o.   MRN: [id number] Colposcopy Procedure Note:  Colposcopy Procedure Note  Indications: Pap smear 1 months ago showed: ASCUS with POSITIVE high risk HPV. The prior pap showed this was her second Pap ever.  Prior cervical/vaginal disease: na. Prior cervical treatment: na.  Smoker:  No. New sexual partner:  Yes.    : time frame:    History of abnormal Pap: no  Procedure Details  The risks and benefits of the procedure and Written informed consent obtained.  Speculum placed in vagina and excellent visualization of cervix achieved, cervix swabbed x 3 with acetic acid solution.  Findings: Cervix: no visible lesions, no mosaicism, no punctation and no abnormal vasculature; no biopsies taken. Vaginal inspection: normal without visible lesions. Vulvar colposcopy: vulvar colposcopy not performed.  Specimens: none  Complications: none.  Plan: Pt needs to have Pa smers performed yearly until she has 3 consecutive normals or follow up colpo if abnormalities persists or worsen on cytological evluation

## 2015-07-30 ENCOUNTER — Encounter (INDEPENDENT_AMBULATORY_CARE_PROVIDER_SITE_OTHER): Payer: Self-pay

## 2015-07-31 ENCOUNTER — Encounter: Payer: Self-pay | Admitting: Pharmacist

## 2015-07-31 ENCOUNTER — Ambulatory Visit (INDEPENDENT_AMBULATORY_CARE_PROVIDER_SITE_OTHER): Payer: Medicaid Other | Admitting: Pharmacist

## 2015-07-31 VITALS — BP 118/80 | HR 84 | Ht 61.0 in | Wt 242.5 lb

## 2015-07-31 DIAGNOSIS — E1140 Type 2 diabetes mellitus with diabetic neuropathy, unspecified: Secondary | ICD-10-CM

## 2015-07-31 MED ORDER — ALBIGLUTIDE 50 MG ~~LOC~~ PEN: 50.0000 mg | PEN_INJECTOR | SUBCUTANEOUS | Status: DC

## 2015-07-31 NOTE — Progress Notes (Signed)
Subjective:    Diane BrunswickRhonda Morgan is a 45 y.o. female who presents for re-evaluation of Type 2 diabetes mellitus and diabetes education.   I last saw patient 2 months ago.   Current diabetic medications include glipizide 10mg  qd (we tried to decrease to 5mg  daily but patient states BG increased), metformin 1000mg  bid, pioglitazone 30mg  qd, Tanzem 30mg  q week  Diet - she has made great changes - increase in water, decrease in serving sizes and limiting sugar and high CHO food intake.  Has increased non starchy vegetable intake. Really trying to resist candy and sweets.  The patient was initially diagnosed with Type 2 diabetes mellitus about 45yo  Known diabetic complications: peripheral neuropathy - improving Cardiovascular risk factors: diabetes mellitus, dyslipidemia, family history of premature cardiovascular disease, obesity (BMI >= 30 kg/m2) and sedentary lifestyle  Eye exam current (within one year): yes Weight trend: decreased by 15# since 03/27/2015 Exercise - reports more walking and goes to gym 3 times per week.  Current monitoring regimen:  Patient is checking 1 to 2 times per day. Home blood sugar records: BG ranges from 90 to 130. Any episodes of hypoglycemia? Yes - has one episode when BG was 62  Is She on ACE inhibitor or angiotensin II receptor blocker?  Yes  lisinopril (Prinivil)    The following portions of the patient's history were reviewed and updated as appropriate: allergies, current medications, past family history, past medical history, past social history, past surgical history and problem list.  Review of  Systems Review of Systems  Constitutional: Negative.   HENT: Negative.   Eyes: Negative.   Gastrointestinal: Negative.   Neurological: Negative.   Endo/Heme/Allergies: Negative.     Objective:    LMP 06/24/2015 Filed Vitals:   07/31/15 0934  BP: 118/80  Pulse: 84   Filed Weights   07/31/15 0934  Weight: 242 lb 8 oz (109.997 kg)   Body mass index  is 45.84 kg/(m^2).  A1c = 7.2% (06/02/2015) A1c = 8.1% (02/25/2015)  Diabetic Foot Form - Detailed   Diabetic Foot Exam - detailed  Diabetic Foot exam was performed with the following findings:  Yes 07/31/2015  9:26 AM  Visual Foot Exam completed.:  Yes  Is there a history of foot ulcer?:  No  Can the patient see the bottom of their feet?:  Yes  Are the shoes appropriate in style and fit?:  Yes  Is there swelling or and abnormal foot shape?:  No (Comment: bunions on both feet)  Are the toenails long?:  No  Are the toenails thick?:  No  Do you have pain in calf while walking?:  No  Is there a claw toe deformity?:  No  Is there elevated skin temparature?:  No  Is there limited skin dorsiflexion?:  No  Is there foot or ankle muscle weakness?:  No  Are the toenails ingrown?:  No  Normal Range of Motion:  Yes    Pulse Foot Exam completed.:  Yes  Right posterior Tibialias:  Present Left posterior Tibialias:  Present  Right Dorsalis Pedis:  Present Left Dorsalis Pedis:  Present  Sensory Foot Exam Completed.:  Yes  Swelling:  No  Semmes-Weinstein Monofilament Test  R Foot Test Control:  Pos L Foot Test Control:  Pos  R Site 1-Great Toe:  Pos L Site 1-Great Toe:  Pos  R Site 4:  Pos L Site 4:  Pos  R Site 5:  Pos L Site 5:  Pos  Lab Review GLUCOSE (mg/dL)  Date Value  40/98/1191 124*  11/14/2014 229*  02/19/2014 216*   GLUCOSE, BLD (mg/dL)  Date Value  47/82/9562 174*  12/01/2012 197*  11/30/2012 179*   CO2 (mmol/L)  Date Value  06/02/2015 21  11/14/2014 19  02/19/2014 21   BUN (mg/dL)  Date Value  13/10/6576 18  11/14/2014 18  02/19/2014 15  12/02/2012 12  12/01/2012 10  11/30/2012 12   CREAT (mg/dL)  Date Value  46/96/2952 1.13*   CREATININE, SER (mg/dL)  Date Value  84/13/2440 0.96  11/14/2014 1.09*  02/19/2014 1.03*   Assessment:    Diabetes Mellitus type II, under improving control.   Obesity - has lost 15# over the last 5 months HTN -  BP at goal today   Plan:    1.  Rx changes: Continue Tanzem  SQ weekly until use up the 4 pens she has.  Then will   Increase to  - Rx given for patient to try next month.   Try again to decrease glipizide to  daily 2.  Education: Reviewed 'ABCs' of diabetes management (respective goals in parentheses):  A1C (<7), blood pressure (<130/80), and cholesterol (LDL <100). 3.  Reviewed BG goals and patient is to continue tocheck BG 1 to 2  times a day 4.  Reviewed CHO counting.  Patient is commended on changes she has made to daily diet.  5. Continue to increase physical activity   - goal is 30 minutes daily for 5 or more days per week 6. Discussed foot care for diabetics 7.  Follow up: 1 month  with PCP and August 2017 with me/CDE   Henrene Pastor, PharmD, CPP, CDE

## 2015-08-14 NOTE — Telephone Encounter (Signed)
Pt has already went to GYN.

## 2015-09-04 ENCOUNTER — Encounter: Payer: Self-pay | Admitting: Family Medicine

## 2015-09-04 ENCOUNTER — Ambulatory Visit (INDEPENDENT_AMBULATORY_CARE_PROVIDER_SITE_OTHER): Payer: Medicaid Other | Admitting: Family Medicine

## 2015-09-04 VITALS — BP 128/93 | HR 87 | Temp 96.8°F | Ht 61.0 in | Wt 229.0 lb

## 2015-09-04 DIAGNOSIS — E1142 Type 2 diabetes mellitus with diabetic polyneuropathy: Secondary | ICD-10-CM

## 2015-09-04 DIAGNOSIS — L259 Unspecified contact dermatitis, unspecified cause: Secondary | ICD-10-CM

## 2015-09-04 DIAGNOSIS — I10 Essential (primary) hypertension: Secondary | ICD-10-CM

## 2015-09-04 LAB — BAYER DCA HB A1C WAIVED: HB A1C: 6.4 % (ref <7.0)

## 2015-09-04 MED ORDER — TRIAMCINOLONE ACETONIDE 0.5 % EX OINT: 1.0000 "application " | TOPICAL_OINTMENT | Freq: Two times a day (BID) | CUTANEOUS | Status: DC

## 2015-09-04 NOTE — Patient Instructions (Signed)
Great to see you!  I have sent a steroid ointment for your rash, it should heal in 3 weeks or so.   You can stop Actos (pioglitazone), you are doing very good with your diet!  Come back to see me in 3 months, See Tammy in 1-2 months

## 2015-09-04 NOTE — Progress Notes (Signed)
   HPI  Patient presents today for follow-up for diabetes, hypertension, and new rash.  Diabetes Feels that she is doing very well Fasting blood sugars range 90-110   average postprandial range 90-120 She is exercising, walking everyday of the week for at least 10 minutes in several days a week doing exercise videos. She's watching her diet closely. She is very happy with the weight loss that she is getting, she would like to reduce the number of pills she is taking.  Hypertension Good medication compliance No headache, chest pain, dyspnea, palpitations, leg edema.  Obesity She's exercising and watching her diet as above.  Rash It's been there for about one week after working in the yard. It responds moderately well to hydrocortisone, it's itchy  PMH: Smoking status noted ROS: Per HPI  Objective: BP 128/93 mmHg  Pulse 87  Temp(Src) 96.8 F (36 C) (Oral)  Ht 5\' 1"  (1.549 m)  Wt 229 lb (103.874 kg)  BMI 43.29 kg/m2  LMP 08/20/2015 Gen: NAD, alert, cooperative with exam HEENT: NCAT CV: RRR, good S1/S2, no murmur Resp: CTABL, no wheezes, non-labored Ext: No edema, warm Neuro: Alert and oriented, No gross deficits  Skin Left arm with rash in the distribution of being hit with a branch, approximately area centimeters by 7 cm, right arm with 2 small erythematous nodules with mild excoriation, approximately 1 cm in diameter  Diabetic Foot Exam - Simple   Simple Foot Form  Visual Inspection  No deformities, no ulcerations, no other skin breakdown bilaterally:  Yes  See comments:  Yes  Sensation Testing  Intact to touch and monofilament testing bilaterally:  Yes  Pulse Check  Posterior Tibialis and Dorsalis pulse intact bilaterally:  Yes  Comments  BL prominent bunions proximal to great toe       Assessment and plan:  # Type 2 diabetes Control improving, A1c 6.4. She is making very good diet and exercise choices, we discussed the risk of her A1c worsening if we  back off of medications, however I have stopped pioglitazone today Discontinue pioglitazone Continue glipizide, Tanzeum, and metformin Follow-up with clinical pharmacist in 4-6 weeks, follow-up with me in one month.  Hypertension Well-controlled Continue lisinopril  Obesity She's doing well with weight loss and making good choices with diet and exercise, encouraged her to continue.  Contact dermatitis Rash consistent with poison ivy exposure Kenalog ointment prescribed, discussed usual course of illness      Orders Placed This Encounter  Procedures  . Bayer DCA Hb A1c Waived    Meds ordered this encounter  Medications  . triamcinolone ointment (KENALOG) 0.5 %    Sig: Apply 1 application topically 2 (two) times daily.    Dispense:  30 g    Refill:  0    Murtis SinkSam Tynia Wiers, MD [facility] 09/04/2015, 8:53 AM

## 2015-09-28 ENCOUNTER — Other Ambulatory Visit: Payer: Self-pay | Admitting: Family Medicine

## 2015-10-19 ENCOUNTER — Encounter: Payer: Medicaid Other | Admitting: *Deleted

## 2015-10-22 ENCOUNTER — Other Ambulatory Visit: Payer: Self-pay | Admitting: *Deleted

## 2015-10-22 DIAGNOSIS — R921 Mammographic calcification found on diagnostic imaging of breast: Secondary | ICD-10-CM

## 2015-10-23 ENCOUNTER — Other Ambulatory Visit: Payer: Self-pay | Admitting: *Deleted

## 2015-10-26 ENCOUNTER — Other Ambulatory Visit: Payer: Self-pay | Admitting: *Deleted

## 2015-10-26 DIAGNOSIS — R921 Mammographic calcification found on diagnostic imaging of breast: Secondary | ICD-10-CM

## 2015-10-29 ENCOUNTER — Other Ambulatory Visit: Payer: Self-pay | Admitting: Pediatrics

## 2015-10-29 DIAGNOSIS — R921 Mammographic calcification found on diagnostic imaging of breast: Secondary | ICD-10-CM

## 2015-11-02 ENCOUNTER — Telehealth: Payer: Self-pay

## 2015-11-03 ENCOUNTER — Encounter: Payer: Self-pay | Admitting: Pharmacist

## 2015-11-03 ENCOUNTER — Ambulatory Visit (INDEPENDENT_AMBULATORY_CARE_PROVIDER_SITE_OTHER): Payer: Medicaid Other | Admitting: Pharmacist

## 2015-11-03 ENCOUNTER — Ambulatory Visit (HOSPITAL_COMMUNITY)
Admission: RE | Admit: 2015-11-03 | Discharge: 2015-11-03 | Disposition: A | Discharge status: Home / Self Care | Payer: Medicaid Other | Source: Ambulatory Visit | Attending: Pediatrics | Admitting: Pediatrics

## 2015-11-03 VITALS — BP 126/80 | HR 72 | Ht 61.0 in | Wt 228.0 lb

## 2015-11-03 DIAGNOSIS — E1142 Type 2 diabetes mellitus with diabetic polyneuropathy: Secondary | ICD-10-CM

## 2015-11-03 DIAGNOSIS — R921 Mammographic calcification found on diagnostic imaging of breast: Secondary | ICD-10-CM | POA: Insufficient documentation

## 2015-11-03 DIAGNOSIS — R928 Other abnormal and inconclusive findings on diagnostic imaging of breast: Secondary | ICD-10-CM | POA: Diagnosis present

## 2015-11-03 DIAGNOSIS — I10 Essential (primary) hypertension: Secondary | ICD-10-CM | POA: Diagnosis not present

## 2015-11-03 NOTE — Telephone Encounter (Signed)
x

## 2015-11-03 NOTE — Progress Notes (Signed)
Patient ID: Hector BrunswickRhonda Jou, female   DOB: [birth year], 45 y.o.   MRN: [id number] Subjective:    Hector BrunswickRhonda Lederman is a 45 y.o. female who presents for re-evaluation of Type 2 diabetes mellitus and diabetes education.   I last saw patient 3 months ago.   Current diabetic medications include glipizide 10mg  qd (we tried in past to decrease to 5mg  daily but patient states BG increased), metformin 1000mg  bid, Tanzem 50mg  q week.  Since our last visit she has stopped pioglitazone with no increase in HBG readings  Diet - she has made great changes - increase in water, decrease in serving sizes and limiting sugar and high CHO food intake.  Has increased non starchy vegetable intake.  The patient was initially diagnosed with Type 2 diabetes mellitus around age 45yo  Known diabetic complications: peripheral neuropathy - improving Cardiovascular risk factors: diabetes mellitus, dyslipidemia, family history of premature cardiovascular disease, obesity (BMI >= 30 kg/m2) and sedentary lifestyle  Eye exam current (within one year): yes Weight trend: decreased by 40# since 03/27/2015 Exercise - reports more walking on treadmill - about everyday for 20 minutes  Current monitoring regimen:  Patient is checking 1 to 2 times per day. Home blood sugar records: BG ranges from 70 to 174 Any episodes of hypoglycemia? Yes - had two episode when BG was 70  Is She on ACE inhibitor or angiotensin II receptor blocker?  Yes  lisinopril (Prinivil)    The following portions of the patient's history were reviewed and updated as appropriate: allergies, current medications, past family history, past medical history, past social history, past surgical history and problem list.  Review of  Systems Review of Systems  Constitutional: Negative.   HENT: Negative.   Eyes: Negative.   Gastrointestinal: Negative.   Genitourinary: Negative.   Neurological: Negative.   Endo/Heme/Allergies: Negative.   Psychiatric/Behavioral:  Negative.     Objective:    BP 126/80   Pulse 72   Ht 5\' 1"  (1.549 m)   Wt 228 lb (103.4 kg)   BMI 43.08 kg/m  Vitals:   11/03/15 0908  BP: 126/80  Pulse: 72   Filed Weights   11/03/15 0908  Weight: 228 lb (103.4 kg)   Body mass index is 43.08 kg/m.  A1c = 6.4% (09/04/2015) A1c = 7.2% (06/02/2015) A1c = 8.1% (02/25/2015)   Lab Review Glucose (mg/dL)  Date Value  40/98/119103/14/2017 124 (H)  11/14/2014 229 (H)  02/19/2014 216 (H)   Glucose, Bld (mg/dL)  Date Value  47/82/956209/14/2014 174 (H)  12/01/2012 197 (H)  11/30/2012 179 (H)   CO2 (mmol/L)  Date Value  06/02/2015 21  11/14/2014 19  02/19/2014 21   BUN (mg/dL)  Date Value  13/08/657803/14/2017 18  11/14/2014 18  02/19/2014 15   Creat (mg/dL)  Date Value  46/96/295205/30/2014 1.13 (H)   Creatinine, Ser (mg/dL)  Date Value  84/13/244003/14/2017 0.96  11/14/2014 1.09 (H)  02/19/2014 1.03 (H)   Assessment:    Diabetes Mellitus type 2 - controlled per last A1c Obesity - has lost 40# over the last 7 months HTN - BP at goal today   Plan:    1.  Rx changes: Continue Tanzem 50mg  SQ weekly. Tanzem will only be manufactured for another year.  Will change once known what Medicaid preferred GLP-1 agonist replacement will be.  If A1c is less than 6.5% when checked in September then recommend retry decreaing glipizide to 5mg  qd with plans to eventually stop.  Continue metformin 1000mg  bid 2.  Education: Reviewed 'ABCs' of diabetes management (respective goals in parentheses):  A1C (<7), blood pressure (<130/80), and cholesterol (LDL <100). 3.  Reviewed BG goals and patient is to continue to check BG 1 to 2  times a day 4.  Reviewed CHO counting / low calorie diet.  Patient is commended on changes she has made to daily diet.  5. Continue to increase physical activity   - goal is 30 minutes daily for 5 or more days per week 6.  Follow up: 1 month  with PCP and November 2017 with me/CDE   Henrene Pastorammy Danisa Kopec, PharmD, CPP, CDE

## 2015-11-03 NOTE — Patient Instructions (Addendum)
Diabetes and Standards of Medical Care   Diabetes is complicated. You may find that your diabetes team includes a dietitian, nurse, diabetes educator, eye doctor, and more. To help everyone know what is going on and to help you get the care you deserve, the following schedule of care was developed to help keep you on track. Below are the tests, exams, vaccines, medicines, education, and plans you will need.  Blood Glucose Goals Prior to meals = 80 - 130 Within 2 hours of the start of a meal = less than 180  HbA1c test (goal is less than 7.0% - your last value was 6.4%) This test shows how well you have controlled your glucose over the past 2 to 3 months. It is used to see if your diabetes management plan needs to be adjusted.   It is performed at least 2 times a year if you are meeting treatment goals.  It is performed 4 times a year if therapy has changed or if you are not meeting treatment goals.  Blood pressure test  This test is performed at every routine medical visit. The goal is less than 140/90 mmHg for most people, but 130/80 mmHg in some cases. Ask your health care provider about your goal.  Dental exam  Follow up with the dentist regularly.  Eye exam  If you are diagnosed with type 1 diabetes as a child, get an exam upon reaching the age of 60 years or older and have had diabetes for 3 to 5 years. Yearly eye exams are recommended after that initial eye exam.  If you are diagnosed with type 1 diabetes as an adult, get an exam within 5 years of diagnosis and then yearly.  If you are diagnosed with type 2 diabetes, get an exam as soon as possible after the diagnosis and then yearly.  Foot care exam  Visual foot exams are performed at every routine medical visit. The exams check for cuts, injuries, or other problems with the feet.  A comprehensive foot exam should be done yearly. This includes visual inspection as well as assessing foot pulses and testing for loss of  sensation.  Check your feet nightly for cuts, injuries, or other problems with your feet. Tell your health care provider if anything is not healing.  Kidney function test (urine microalbumin)  This test is performed once a year.  Type 1 diabetes: The first test is performed 5 years after diagnosis.  Type 2 diabetes: The first test is performed at the time of diagnosis.  A serum creatinine and estimated glomerular filtration rate (eGFR) test is done once a year to assess the level of chronic kidney disease (CKD), if present.  Lipid profile (cholesterol, HDL, LDL, triglycerides)  Performed every 5 years for most people.  The goal for LDL is less than 100 mg/dL. If you are at high risk, the goal is less than 70 mg/dL.  The goal for HDL is 40 mg/dL to 50 mg/dL for men and 50 mg/dL to 60 mg/dL for women. An HDL cholesterol of 60 mg/dL or higher gives some protection against heart disease.  The goal for triglycerides is less than 150 mg/dL.  Influenza vaccine, pneumococcal vaccine, and hepatitis B vaccine  The influenza vaccine is recommended yearly.  The pneumococcal vaccine is generally given once in a lifetime. However, there are some instances when another vaccination is recommended. Check with your health care provider.  The hepatitis B vaccine is also recommended for adults with diabetes.  Diabetes self-management education  Education is recommended at diagnosis and ongoing as needed.  Treatment plan  Your treatment plan is reviewed at every medical visit.  Document Released: 01/02/2009 Document Revised: 11/07/2012 Document Reviewed: 08/07/2012 Exit[employer] Patient Information 2014 [employer].   

## 2015-11-17 ENCOUNTER — Other Ambulatory Visit: Payer: Self-pay | Admitting: Family Medicine

## 2015-11-17 DIAGNOSIS — D509 Iron deficiency anemia, unspecified: Secondary | ICD-10-CM

## 2015-11-17 DIAGNOSIS — I10 Essential (primary) hypertension: Secondary | ICD-10-CM

## 2015-11-17 DIAGNOSIS — E1140 Type 2 diabetes mellitus with diabetic neuropathy, unspecified: Secondary | ICD-10-CM

## 2015-11-17 DIAGNOSIS — E1142 Type 2 diabetes mellitus with diabetic polyneuropathy: Secondary | ICD-10-CM

## 2015-11-30 ENCOUNTER — Other Ambulatory Visit: Payer: Self-pay | Admitting: Family Medicine

## 2015-11-30 DIAGNOSIS — I10 Essential (primary) hypertension: Secondary | ICD-10-CM

## 2015-11-30 DIAGNOSIS — D509 Iron deficiency anemia, unspecified: Secondary | ICD-10-CM

## 2015-11-30 DIAGNOSIS — E1140 Type 2 diabetes mellitus with diabetic neuropathy, unspecified: Secondary | ICD-10-CM

## 2015-11-30 DIAGNOSIS — E1142 Type 2 diabetes mellitus with diabetic polyneuropathy: Secondary | ICD-10-CM

## 2015-12-07 ENCOUNTER — Ambulatory Visit (INDEPENDENT_AMBULATORY_CARE_PROVIDER_SITE_OTHER): Payer: Medicaid Other | Admitting: Family Medicine

## 2015-12-07 ENCOUNTER — Encounter: Payer: Self-pay | Admitting: Family Medicine

## 2015-12-07 VITALS — BP 119/74 | HR 98 | Temp 96.7°F | Ht 61.0 in | Wt 219.0 lb

## 2015-12-07 DIAGNOSIS — Z23 Encounter for immunization: Secondary | ICD-10-CM | POA: Diagnosis not present

## 2015-12-07 DIAGNOSIS — D509 Iron deficiency anemia, unspecified: Secondary | ICD-10-CM | POA: Diagnosis not present

## 2015-12-07 DIAGNOSIS — E785 Hyperlipidemia, unspecified: Secondary | ICD-10-CM | POA: Diagnosis not present

## 2015-12-07 DIAGNOSIS — E1142 Type 2 diabetes mellitus with diabetic polyneuropathy: Secondary | ICD-10-CM | POA: Diagnosis not present

## 2015-12-07 DIAGNOSIS — E038 Other specified hypothyroidism: Secondary | ICD-10-CM | POA: Diagnosis not present

## 2015-12-07 DIAGNOSIS — E039 Hypothyroidism, unspecified: Secondary | ICD-10-CM

## 2015-12-07 LAB — BAYER DCA HB A1C WAIVED: HB A1C (BAYER DCA - WAIVED): 6.7 % (ref <7.0)

## 2015-12-07 NOTE — Patient Instructions (Signed)
Great to see you!  Lets follow up in 3 months, We will cut the glipizide in half if your A1C is controlled.   I am very happy with your progress, keep up the good work!

## 2015-12-07 NOTE — Progress Notes (Signed)
   HPI  Patient presents today here for follow-up of diabetes, morbid obesity, hyperlipidemia, and for influenza vaccine.  Diabetes Blood sugars very well controlled, fasting 100-120, postprandial 100-120. No hypoglycemia She has good medication compliance, she is walking twice daily for about a half an hour at a time. She's watching her diet closely.  Hyperlipidemia Good medication compliance, watching diet closely.  She is willing to get an influenza vaccine today  She is happy with her weight loss  PMH: Smoking status noted ROS: Per HPI  Objective: BP 119/74   Pulse 98   Temp (!) 96.7 F (35.9 C) (Oral)   Ht _0  (1.549 m)   Wt 219 lb (99.3 kg)   BMI 41.38 kg/m  Gen: NAD, alert, cooperative with exam HEENT: NCAT CV: RRR, good S1/S2, no murmur Resp: CTABL, no wheezes, non-labored Ext: No edema, warm Neuro: Alert and oriented, No gross deficits  Diabetic Foot Exam - Simple   Simple Foot Form Visual Inspection No deformities, no ulcerations, no other skin breakdown bilaterally:  Yes Sensation Testing Intact to touch and monofilament testing bilaterally:  Yes Pulse Check Posterior Tibialis and Dorsalis pulse intact bilaterally:  Yes Comments prominent metatarsophalangeal joints bilaterally     Assessment and plan:  # Type 2 diabetes Likely well controlled, her fasting and postprandial numbers look very good A1c pending Continue metformin, glipizide, and tanzeum Will decrease glipizide to 5 mg if A1c is controlled, agree with clinical pharmacist Foot exam largely normal   # morbid obesity Continues to lose weight, doing very well  # Hyperlipidemia Repeating labs, continue Lipitor  # Subclinical hypothyroidism Rechecking TSH, continue Synthroid  Microcytic anemia Repeating labs, checking ferritin as well  HCM- flu shot today    Orders Placed This Encounter  Procedures  . Bayer DCA Hb A1c Waived  . Lipid panel  . CMP14+EGFR  . CBC with  Differential  . Ferritin  . TSH    Laroy Apple, MD [facility] 12/07/2015, 8:33 AM

## 2015-12-08 ENCOUNTER — Other Ambulatory Visit: Payer: Self-pay | Admitting: Family Medicine

## 2015-12-08 DIAGNOSIS — E781 Pure hyperglyceridemia: Secondary | ICD-10-CM | POA: Insufficient documentation

## 2015-12-08 LAB — CBC WITH DIFFERENTIAL/PLATELET
Basophils Absolute: 0.1 10*3/uL (ref 0.0–0.2)
Basos: 1 %
EOS (ABSOLUTE): 0.1 10*3/uL (ref 0.0–0.4)
Eos: 1 %
HEMATOCRIT: 35.6 % (ref 34.0–46.6)
Hemoglobin: 11.6 g/dL (ref 11.1–15.9)
IMMATURE GRANS (ABS): 0 10*3/uL (ref 0.0–0.1)
IMMATURE GRANULOCYTES: 0 %
LYMPHS ABS: 2.7 10*3/uL (ref 0.7–3.1)
Lymphs: 27 %
MCH: 24.4 pg — ABNORMAL LOW (ref 26.6–33.0)
MCHC: 32.6 g/dL (ref 31.5–35.7)
MCV: 75 fL — AB (ref 79–97)
Monocytes Absolute: 0.7 10*3/uL (ref 0.1–0.9)
Monocytes: 7 %
NEUTROS ABS: 6.7 10*3/uL (ref 1.4–7.0)
Neutrophils: 64 %
PLATELETS: 365 10*3/uL (ref 150–379)
RBC: 4.76 x10E6/uL (ref 3.77–5.28)
RDW: 18.6 % — ABNORMAL HIGH (ref 12.3–15.4)
WBC: 10.3 10*3/uL (ref 3.4–10.8)

## 2015-12-08 LAB — CMP14+EGFR
A/G RATIO: 1.4 (ref 1.2–2.2)
ALBUMIN: 3.8 g/dL (ref 3.5–5.5)
ALT: 11 IU/L (ref 0–32)
AST: 12 IU/L (ref 0–40)
Alkaline Phosphatase: 117 IU/L (ref 39–117)
BILIRUBIN TOTAL: 0.2 mg/dL (ref 0.0–1.2)
BUN / CREAT RATIO: 10 (ref 9–23)
BUN: 12 mg/dL (ref 6–24)
CHLORIDE: 102 mmol/L (ref 96–106)
CO2: 17 mmol/L — ABNORMAL LOW (ref 18–29)
Calcium: 8.1 mg/dL — ABNORMAL LOW (ref 8.7–10.2)
Creatinine, Ser: 1.18 mg/dL — ABNORMAL HIGH (ref 0.57–1.00)
GFR calc non Af Amer: 57 mL/min/{1.73_m2} — ABNORMAL LOW (ref >59)
GFR, EST AFRICAN AMERICAN: 65 mL/min/{1.73_m2} (ref >59)
Globulin, Total: 2.7 g/dL (ref 1.5–4.5)
Glucose: 119 mg/dL — ABNORMAL HIGH (ref 65–99)
POTASSIUM: 4.3 mmol/L (ref 3.5–5.2)
SODIUM: 137 mmol/L (ref 134–144)
TOTAL PROTEIN: 6.5 g/dL (ref 6.0–8.5)

## 2015-12-08 LAB — LIPID PANEL
Chol/HDL Ratio: 6.3 ratio units — ABNORMAL HIGH (ref 0.0–4.4)
Cholesterol, Total: 150 mg/dL (ref 100–199)
HDL: 24 mg/dL — AB (ref >39)
TRIGLYCERIDES: 597 mg/dL — AB (ref 0–149)

## 2015-12-08 LAB — TSH: TSH: 1.76 u[IU]/mL (ref 0.450–4.500)

## 2015-12-08 LAB — FERRITIN: Ferritin: 14 ng/mL — ABNORMAL LOW (ref 15–150)

## 2015-12-08 MED ORDER — FENOFIBRATE 145 MG PO TABS
145.0000 mg | ORAL_TABLET | Freq: Every day | ORAL | 5 refills | Status: DC
Start: 2015-12-08 — End: 2016-09-08

## 2015-12-14 ENCOUNTER — Telehealth: Payer: Self-pay | Admitting: Family Medicine

## 2015-12-14 ENCOUNTER — Other Ambulatory Visit: Payer: Self-pay | Admitting: Family Medicine

## 2015-12-14 NOTE — Telephone Encounter (Signed)
Patients daughter aware of results.

## 2015-12-24 ENCOUNTER — Other Ambulatory Visit: Payer: Self-pay | Admitting: Family Medicine

## 2016-01-20 ENCOUNTER — Ambulatory Visit (INDEPENDENT_AMBULATORY_CARE_PROVIDER_SITE_OTHER): Payer: Medicaid Other | Admitting: Pharmacist

## 2016-01-20 ENCOUNTER — Encounter: Payer: Self-pay | Admitting: Pharmacist

## 2016-01-20 VITALS — BP 122/80 | Ht 61.0 in | Wt 221.0 lb

## 2016-01-20 DIAGNOSIS — E119 Type 2 diabetes mellitus without complications: Secondary | ICD-10-CM | POA: Diagnosis not present

## 2016-01-20 DIAGNOSIS — E66813 Obesity, class 3: Secondary | ICD-10-CM

## 2016-01-20 NOTE — Progress Notes (Signed)
Patient ID: Diane Morgan Husby, female   DOB: [birth year], 45 y.o.   MRN: [id number] Subjective:    Diane Morgan White is a 45 y.o. female who presents for re-evaluation of Type 2 diabetes mellitus and diabetes education.   I last saw patient in August 2017.    Current diabetic medications include: metformin 1000mg  bid, Tanzem 50mg  q week.  Over the last 12 months she has been able to stop both pioglitazone and glipizide.  Diet - usually gets salad when eating out.  [facility] - removes skin from chicken, green beans and slaw and no biscuit. Continues to drink lots of water and is limiting serving sizes and sugar and intake.  Has increased non starchy vegetable intake.  The patient was initially diagnosed with Type 2 diabetes mellitus around age 45yo  Known diabetic complications: peripheral neuropathy - improving Cardiovascular risk factors: diabetes mellitus, dyslipidemia, family history of premature cardiovascular disease, obesity (BMI >= 30 kg/m2) and sedentary lifestyle  Eye exam current (within one year): yes Weight trend: decreased by 40# since 03/27/2015 Exercise - reports more walking on treadmill - about every other day for 20-40 minutes  Current monitoring regimen:  Patient is checking 1 to 2 times per day. Home blood sugar records: BG ranges from 70 to 125 Any episodes of hypoglycemia? No  Is She on ACE inhibitor or angiotensin II receptor blocker?  Yes  lisinopril (Prinivil)    The following portions of the patient's history were reviewed and updated as appropriate: allergies, current medications, past family history, past medical history, past social history, past surgical history and problem list.  Review of  Systems Review of Systems  Constitutional: Negative.   HENT: Negative.   Eyes: Negative.   Gastrointestinal: Negative.   Genitourinary: Negative.   Neurological: Negative.   Endo/Heme/Allergies: Negative.   Psychiatric/Behavioral: Negative.     Objective:     Ht 5\' 1"  (1.549 m)   Wt 221 lb (100.2 kg)   BMI 41.76 kg/m   BP = 122/80  A1c = 6.7% (12/07/2015) A1c = 6.4% (09/04/2015) A1c = 7.2% (06/02/2015) A1c = 8.1% (02/25/2015)   Lab Review Glucose (mg/dL)  Date Value  02/72/536609/18/2017 119 (H)  06/02/2015 124 (H)  11/14/2014 229 (H)   Glucose, Bld (mg/dL)  Date Value  44/03/474209/14/2014 174 (H)  12/01/2012 197 (H)  11/30/2012 179 (H)   CO2 (mmol/L)  Date Value  12/07/2015 17 (L)  06/02/2015 21  11/14/2014 19   BUN (mg/dL)  Date Value  59/56/387509/18/2017 12  06/02/2015 18  11/14/2014 18   Creat (mg/dL)  Date Value  64/33/295105/30/2014 1.13 (H)   Creatinine, Ser (mg/dL)  Date Value  88/41/660609/18/2017 1.18 (H)  06/02/2015 0.96  11/14/2014 1.09 (H)   Assessment:    Diabetes Mellitus type 2 - controlled per last A1c Obesity - has lost 40# over the last 7 months HTN - BP at goal today   Plan:    1.  Rx changes:   Continue Tanzem 50mg  SQ weekly. Tanzem will only be manufactured for another year.  Will change once known what Medicaid preferred GLP-1 agonist replacement will be.  Continue metformin 1000mg  bid 2.  Education: Reviewed 'ABCs' of diabetes management (respective goals in parentheses):  A1C (<7), blood pressure (<130/80), and cholesterol (LDL <100). 3.  Reviewed BG goals and patient is to continue to check BG 1 to 2  times a day 4.  Reviewed CHO counting / low calorie diet.   5. Continue to increase physical activity   -  goal is 30 minutes daily for 5 or more days per week 6.  Follow up: 1 month  with PCP and Feb 2018 with me/CDE  Orders Placed This Encounter  Procedures  . Microalbumin / creatinine urine ratio      Henrene Pastorammy Tommy Goostree, PharmD, CPP, CDE Patient ID: Diane Morgan Cerezo, female   DOB: [birth year], [age] y.o.   MRN: [id number]

## 2016-01-21 LAB — MICROALBUMIN / CREATININE URINE RATIO
Creatinine, Urine: 50.9 mg/dL
Microalb/Creat Ratio: 31.4 mg/g creat — ABNORMAL HIGH (ref 0.0–30.0)
Microalbumin, Urine: 16 ug/mL

## 2016-01-26 ENCOUNTER — Other Ambulatory Visit: Payer: Self-pay | Admitting: Family Medicine

## 2016-03-07 ENCOUNTER — Encounter: Payer: Self-pay | Admitting: Family Medicine

## 2016-03-07 ENCOUNTER — Ambulatory Visit (INDEPENDENT_AMBULATORY_CARE_PROVIDER_SITE_OTHER): Payer: Medicaid Other | Admitting: Family Medicine

## 2016-03-07 VITALS — BP 114/69 | HR 101 | Temp 96.9°F | Ht 61.0 in | Wt 210.8 lb

## 2016-03-07 DIAGNOSIS — R718 Other abnormality of red blood cells: Secondary | ICD-10-CM | POA: Diagnosis not present

## 2016-03-07 DIAGNOSIS — E781 Pure hyperglyceridemia: Secondary | ICD-10-CM | POA: Diagnosis not present

## 2016-03-07 DIAGNOSIS — E1142 Type 2 diabetes mellitus with diabetic polyneuropathy: Secondary | ICD-10-CM | POA: Diagnosis not present

## 2016-03-07 LAB — BAYER DCA HB A1C WAIVED: HB A1C (BAYER DCA - WAIVED): 6.5 % (ref <7.0)

## 2016-03-07 NOTE — Patient Instructions (Signed)
Great job working on [employer] diet!  Lets plan on seeing you again in 3 months, keep up the good work!

## 2016-03-07 NOTE — Progress Notes (Signed)
   HPI  Patient presents today to follow-up on chronic medical conditions  Diabetes Fasting blood sugar 90-120, good medication compliance with injection and metformin. One episode of blood sugar to 65, improved easily with [employer]. Also exercising by walking approximately one hour every day.  Obesity She's very healthy happy with her weight loss.  Hypertriglyceridemia Patient started fenofibrate, still taking Lipitor as well No muscle aches or pains, tolerating medication easily.    PMH: Smoking status noted ROS: Per HPI  Objective: BP 114/69   Pulse (!) 101   Temp (!) 96.9 F (36.1 C) (Oral)   Ht '5\' 1"'$  (1.549 m)   Wt 210 lb 12.8 oz (95.6 kg)   BMI 39.83 kg/m  Gen: NAD, alert, cooperative with exam HEENT: NCAT CV: RRR, good S1/S2, no murmur Resp: CTABL, no wheezes, non-labored Ext: No edema, warm Neuro: Alert and oriented, No gross deficits  Diabetic Foot Exam - Simple   Simple Foot Form Visual Inspection No deformities, no ulcerations, no other skin breakdown bilaterally:  Yes Sensation Testing Intact to touch and monofilament testing bilaterally:  Yes Pulse Check Posterior Tibialis and Dorsalis pulse intact bilaterally:  Yes Comments Bilateral bunion on the medial portion of the foot at the first MTP      Assessment and plan:  # T2DM Well controlled, A1C improved to 6.5 Continue metformin and tanzeum  # Orbit obesity Improving, congratulated Continue therapeutic lifestyle changes COntinue GLP  # Hypertriglyceridemia Tolerating fenofibrate, repeating labs today  # Microcytosis Likely due to iron deficiency, replacing once daily Repeat labs     Orders Placed This Encounter  Procedures  . Bayer DCA Hb A1c Waived  . CMP14+EGFR  . CBC with Differential/Platelet  . Lipid panel    Meds ordered this encounter  Medications  . acetaminophen-codeine (TYLENOL #3) 300-30 MG tablet    Sig: Take by mouth every 4 (four) hours as needed for  moderate pain.    Laroy Apple, MD [facility] 03/07/2016, 9:05 AM

## 2016-03-08 ENCOUNTER — Other Ambulatory Visit: Payer: Self-pay | Admitting: Family Medicine

## 2016-03-08 LAB — CMP14+EGFR
ALBUMIN: 3.7 g/dL (ref 3.5–5.5)
ALK PHOS: 79 IU/L (ref 39–117)
ALT: 9 IU/L (ref 0–32)
AST: 11 IU/L (ref 0–40)
Albumin/Globulin Ratio: 1.7 (ref 1.2–2.2)
BUN/Creatinine Ratio: 9 (ref 9–23)
BUN: 11 mg/dL (ref 6–24)
Bilirubin Total: <0.2 mg/dL (ref 0.0–1.2)
CALCIUM: 8.2 mg/dL — AB (ref 8.7–10.2)
CO2: 16 mmol/L — AB (ref 18–29)
CREATININE: 1.26 mg/dL — AB (ref 0.57–1.00)
Chloride: 102 mmol/L (ref 96–106)
GFR calc Af Amer: 60 mL/min/{1.73_m2} (ref >59)
GFR calc non Af Amer: 52 mL/min/{1.73_m2} — ABNORMAL LOW (ref >59)
GLUCOSE: 138 mg/dL — AB (ref 65–99)
Globulin, Total: 2.2 g/dL (ref 1.5–4.5)
Potassium: 4.4 mmol/L (ref 3.5–5.2)
Sodium: 138 mmol/L (ref 134–144)
Total Protein: 5.9 g/dL — ABNORMAL LOW (ref 6.0–8.5)

## 2016-03-08 LAB — LIPID PANEL
CHOL/HDL RATIO: 6.8 ratio — AB (ref 0.0–4.4)
Cholesterol, Total: 170 mg/dL (ref 100–199)
HDL: 25 mg/dL — AB (ref >39)
TRIGLYCERIDES: 453 mg/dL — AB (ref 0–149)

## 2016-03-08 LAB — CBC WITH DIFFERENTIAL/PLATELET
Basophils Absolute: 0.1 10*3/uL (ref 0.0–0.2)
Basos: 1 %
EOS (ABSOLUTE): 0.2 10*3/uL (ref 0.0–0.4)
EOS: 3 %
HEMATOCRIT: 35.5 % (ref 34.0–46.6)
HEMOGLOBIN: 11.3 g/dL (ref 11.1–15.9)
IMMATURE GRANS (ABS): 0 10*3/uL (ref 0.0–0.1)
IMMATURE GRANULOCYTES: 0 %
Lymphocytes Absolute: 2.7 10*3/uL (ref 0.7–3.1)
Lymphs: 27 %
MCH: 24.6 pg — ABNORMAL LOW (ref 26.6–33.0)
MCHC: 31.8 g/dL (ref 31.5–35.7)
MCV: 77 fL — ABNORMAL LOW (ref 79–97)
MONOCYTES: 7 %
Monocytes Absolute: 0.6 10*3/uL (ref 0.1–0.9)
NEUTROS PCT: 62 %
Neutrophils Absolute: 6.2 10*3/uL (ref 1.4–7.0)
Platelets: 350 10*3/uL (ref 150–379)
RBC: 4.59 x10E6/uL (ref 3.77–5.28)
RDW: 16.8 % — ABNORMAL HIGH (ref 12.3–15.4)
WBC: 9.8 10*3/uL (ref 3.4–10.8)

## 2016-03-10 ENCOUNTER — Telehealth: Payer: Self-pay | Admitting: Family Medicine

## 2016-03-10 DIAGNOSIS — E872 Acidosis, unspecified: Secondary | ICD-10-CM | POA: Insufficient documentation

## 2016-03-10 NOTE — Telephone Encounter (Signed)
Dicussed with her mother  DC metformin with chronic (3 months) AG metabolic acidosis and slightly worsening renal dysfunction.   BE sure to be compliant with fenofibrate.   RTC in 3-4 weeks for repeat labs @ lab only visit.   Murtis SinkSam Bradshaw, MD [facility] 03/10/2016, 5:46 PM

## 2016-03-18 LAB — HM DIABETES EYE EXAM

## 2016-04-04 ENCOUNTER — Other Ambulatory Visit: Payer: Medicaid Other

## 2016-04-04 DIAGNOSIS — E872 Acidosis, unspecified: Secondary | ICD-10-CM

## 2016-04-04 LAB — CMP14+EGFR
ALK PHOS: 58 IU/L (ref 39–117)
ALT: 20 IU/L (ref 0–32)
AST: 16 IU/L (ref 0–40)
Albumin/Globulin Ratio: 1.4 (ref 1.2–2.2)
Albumin: 3.5 g/dL (ref 3.5–5.5)
BUN/Creatinine Ratio: 12 (ref 9–23)
BUN: 16 mg/dL (ref 6–24)
Bilirubin Total: 0.2 mg/dL (ref 0.0–1.2)
CO2: 21 mmol/L (ref 18–29)
CREATININE: 1.3 mg/dL — AB (ref 0.57–1.00)
Calcium: 8.4 mg/dL — ABNORMAL LOW (ref 8.7–10.2)
Chloride: 102 mmol/L (ref 96–106)
GFR calc Af Amer: 58 mL/min/{1.73_m2} — ABNORMAL LOW (ref >59)
GFR calc non Af Amer: 50 mL/min/{1.73_m2} — ABNORMAL LOW (ref >59)
GLOBULIN, TOTAL: 2.5 g/dL (ref 1.5–4.5)
GLUCOSE: 216 mg/dL — AB (ref 65–99)
Potassium: 4.3 mmol/L (ref 3.5–5.2)
SODIUM: 135 mmol/L (ref 134–144)
Total Protein: 6 g/dL (ref 6.0–8.5)

## 2016-04-07 ENCOUNTER — Other Ambulatory Visit: Payer: Self-pay | Admitting: Family Medicine

## 2016-04-07 DIAGNOSIS — E1140 Type 2 diabetes mellitus with diabetic neuropathy, unspecified: Secondary | ICD-10-CM

## 2016-04-07 DIAGNOSIS — E1142 Type 2 diabetes mellitus with diabetic polyneuropathy: Secondary | ICD-10-CM

## 2016-04-07 DIAGNOSIS — D509 Iron deficiency anemia, unspecified: Secondary | ICD-10-CM

## 2016-04-07 DIAGNOSIS — I10 Essential (primary) hypertension: Secondary | ICD-10-CM

## 2016-04-21 ENCOUNTER — Encounter: Payer: Self-pay | Admitting: Pharmacist

## 2016-04-21 ENCOUNTER — Ambulatory Visit (INDEPENDENT_AMBULATORY_CARE_PROVIDER_SITE_OTHER): Payer: Medicaid Other | Admitting: Pharmacist

## 2016-04-21 VITALS — BP 122/72 | HR 90 | Ht 61.0 in | Wt 208.5 lb

## 2016-04-21 DIAGNOSIS — E1142 Type 2 diabetes mellitus with diabetic polyneuropathy: Secondary | ICD-10-CM

## 2016-04-21 MED ORDER — EXENATIDE ER 2 MG ~~LOC~~ PEN: 2.0000 mg | PEN_INJECTOR | SUBCUTANEOUS | 2 refills | Status: DC

## 2016-04-21 NOTE — Progress Notes (Signed)
Patient ID: Diane Morgan, female   DOB: [birth year], 46 y.o.   MRN: [id number]    Subjective:    Diane Morgan is a 46 y.o. female who presents for re-evaluation of Type 2 diabetes mellitus and obesity  I last saw patient in November 2017.    Current diabetic medications include: metformin 1000mg  bid, Tanzem 50mg  q week.  Over the last 12 months she has been able to stop both pioglitazone and glipizide. Metformin was recently stopped due to acidosis.  Resolved after stopped  Diet -  Continues to drink lots of water, non sugar lemonade or unsweet tea.  She is limiting serving sizes and sugar intake.  Continues to eat salads, vegetables and fruits.  No bread.   The patient was initially diagnosed with Type 2 diabetes mellitus around age 89yo  Known diabetic complications: peripheral neuropathy - improving Cardiovascular risk factors: diabetes mellitus, dyslipidemia, family history of premature cardiovascular disease, obesity (BMI >= 30 kg/m2) and sedentary lifestyle  Eye exam current (within one year): yes [facility] Weight trend: decreased by 59# since 03/27/2015 Exercise - treadmill/  About 4 times per week for 30 minutes  Current monitoring regimen:  Patient is checking 1 to 2 times per day. Home blood sugar records: BG ranges from 100 to 200 BG in 200's is rare but had noticed that BG has increased since metformin stopped Any episodes of hypoglycemia? No  Is She on ACE inhibitor or angiotensin II receptor blocker?  Yes  lisinopril (Prinivil)    The following portions of the patient's history were reviewed and updated as appropriate: allergies, current medications, past family history, past medical history, past social history, past surgical history and problem list.  Review of  Systems Review of Systems  Constitutional: Negative.   HENT: Positive for congestion.   Eyes: Negative.   Gastrointestinal: Negative.   Genitourinary: Negative.    Neurological: Negative.   Endo/Heme/Allergies: Negative.   Psychiatric/Behavioral: Negative.     Objective:    BP 122/72   Pulse 90   Ht 5\' 1"  (1.549 m)   Wt 208 lb 8 oz (94.6 kg)   BMI 39.40 kg/m    A1c = 6.5% (03/07/2016) A1c = 6.7% (12/07/2015) A1c = 6.4% (09/04/2015) A1c = 7.2% (06/02/2015) A1c = 8.1% (02/25/2015)   Lab Review Glucose (mg/dL)  Date Value  14/78/295601/15/2018 216 (H)  03/07/2016 138 (H)  12/07/2015 119 (H)   Glucose, Bld (mg/dL)  Date Value  21/30/865709/14/2014 174 (H)  12/01/2012 197 (H)  11/30/2012 179 (H)   CO2 (mmol/L)  Date Value  04/04/2016 21  03/07/2016 16 (L)  12/07/2015 17 (L)   BUN (mg/dL)  Date Value  84/69/629501/15/2018 16  03/07/2016 11  12/07/2015 12   Creat (mg/dL)  Date Value  28/41/324405/30/2014 1.13 (H)   Creatinine, Ser (mg/dL)  Date Value  01/02/725301/15/2018 1.30 (H)  03/07/2016 1.26 (H)  12/07/2015 1.18 (H)   Assessment:    Diabetes Mellitus type 2 - controlled per last A1c Obesity - has lost almost 60## over the last 12 months HTN - BP at goal today   Plan:    1.  Rx changes:   Switch Tanzem 50mg  to Bydureon 2mg  inject SQ weekly.  Tanzem will only be manufactured through March 2018 2.  Education: Reviewed 'ABCs' of diabetes management (respective goals in parentheses):  A1C (<7), blood pressure (<130/80), and cholesterol (LDL <100). 3.  Reviewed BG goals and patient is to  continue to check BG 1 to 2  times a day 4.  Continue CHO counting / low calorie diet.   5. Continue to increase physical activity   - goal is 30 minutes daily for 5 or more days per week 6.  Will ask quality dept to request copy of most recent eye exam from Dr LE 7.  Follow up: with PCP in 6 weeks and with me in 6 months.  No orders of the defined types were placed in this encounter.     Henrene Pastor, PharmD, CPP, CDE

## 2016-04-21 NOTE — Patient Instructions (Signed)
Continue with diet and exercise - GREAT JOB!!  Switch to Bydureon once you finish current prescription of Tanzeum since Tamzeum will soon no longer be available.

## 2016-05-24 ENCOUNTER — Other Ambulatory Visit: Payer: Self-pay | Admitting: Family Medicine

## 2016-05-24 DIAGNOSIS — I10 Essential (primary) hypertension: Secondary | ICD-10-CM

## 2016-05-24 DIAGNOSIS — E1142 Type 2 diabetes mellitus with diabetic polyneuropathy: Secondary | ICD-10-CM

## 2016-05-24 DIAGNOSIS — E1140 Type 2 diabetes mellitus with diabetic neuropathy, unspecified: Secondary | ICD-10-CM

## 2016-05-24 DIAGNOSIS — D509 Iron deficiency anemia, unspecified: Secondary | ICD-10-CM

## 2016-06-03 ENCOUNTER — Other Ambulatory Visit: Payer: Self-pay | Admitting: Family Medicine

## 2016-06-03 DIAGNOSIS — I10 Essential (primary) hypertension: Secondary | ICD-10-CM

## 2016-06-03 DIAGNOSIS — E1140 Type 2 diabetes mellitus with diabetic neuropathy, unspecified: Secondary | ICD-10-CM

## 2016-06-03 DIAGNOSIS — E1142 Type 2 diabetes mellitus with diabetic polyneuropathy: Secondary | ICD-10-CM

## 2016-06-03 DIAGNOSIS — D509 Iron deficiency anemia, unspecified: Secondary | ICD-10-CM

## 2016-06-06 ENCOUNTER — Encounter: Payer: Self-pay | Admitting: Family Medicine

## 2016-06-06 ENCOUNTER — Ambulatory Visit (INDEPENDENT_AMBULATORY_CARE_PROVIDER_SITE_OTHER): Payer: Medicaid Other | Admitting: Family Medicine

## 2016-06-06 VITALS — BP 132/83 | HR 71 | Temp 98.5°F | Ht 61.0 in | Wt 214.8 lb

## 2016-06-06 DIAGNOSIS — E1142 Type 2 diabetes mellitus with diabetic polyneuropathy: Secondary | ICD-10-CM | POA: Diagnosis not present

## 2016-06-06 DIAGNOSIS — E781 Pure hyperglyceridemia: Secondary | ICD-10-CM | POA: Diagnosis not present

## 2016-06-06 DIAGNOSIS — M79669 Pain in unspecified lower leg: Secondary | ICD-10-CM

## 2016-06-06 LAB — BAYER DCA HB A1C WAIVED: HB A1C (BAYER DCA - WAIVED): 7.4 % — ABNORMAL HIGH (ref <7.0)

## 2016-06-06 NOTE — Patient Instructions (Signed)
Great to see you!  Lets follow up in 3 months for diabetes unless you ned us sooner.

## 2016-06-06 NOTE — Progress Notes (Signed)
   HPI  Patient presents today here to follow-up for chronic medical conditions as well as shin pain.  Shin pain Started about 2 weeks ago, it is improved when she wears athletic shoes. She states that she's been more active lately mowing her grass as well as playing with a friend's 44-year-old daughter who is learning to walk.  Diabetes Average blood sugar in the morning is around 100-120, she's had one episode in the 60s. Overall she feels better on the GLP, she has lost some weight.  She states she's gained a little weight recently due to being on her period.  PMH: Smoking status noted ROS: Per HPI  Objective: BP 132/83   Pulse 71   Temp 98.5 F (36.9 C) (Oral)   Ht _0  (1.549 m)   Wt 214 lb 12.8 oz (97.4 kg)   BMI 40.59 kg/m  Gen: NAD, alert, cooperative with exam HEENT: NCAT CV: RRR, good S1/S2, no murmur Resp: CTABL, no wheezes, non-labored Ext: No edema, warm Neuro: Alert and oriented, No gross deficits  Diabetic Foot Exam - Simple   Simple Foot Form Diabetic Foot exam was performed with the following findings:  Yes 06/06/2016  8:39 AM  Visual Inspection No deformities, no ulcerations, no other skin breakdown bilaterally:  Yes Sensation Testing Intact to touch and monofilament testing bilaterally:  Yes Pulse Check Posterior Tibialis and Dorsalis pulse intact bilaterally:  Yes Comments Prominent bunions bilateral distal MTP on the medial aspect      Assessment and plan:  # Type 2 diabetes A1c pending, expect good control Continue GLP Previously had metabolic acidosis with metformin.  # Shin pain Bilateral, no red flags, likely due to increased exercise area improves with athletic shoes which I recommended. This will likely self resolve  # Hypertriglyceridemia Fasting lipid panel repeated today. No treatment below 500 Discussed     Orders Placed This Encounter  Procedures  . Bayer DCA Hb A1c Waived  . Lipid panel  . [place], MD [facility] 06/06/2016, 8:41 AM

## 2016-06-07 LAB — LIPID PANEL
Chol/HDL Ratio: 3.2 ratio units (ref 0.0–4.4)
Cholesterol, Total: 86 mg/dL — ABNORMAL LOW (ref 100–199)
HDL: 27 mg/dL — ABNORMAL LOW (ref >39)
LDL Calculated: 31 mg/dL (ref 0–99)
TRIGLYCERIDES: 141 mg/dL (ref 0–149)
VLDL Cholesterol Cal: 28 mg/dL (ref 5–40)

## 2016-06-07 LAB — CMP14+EGFR
A/G RATIO: 1.5 (ref 1.2–2.2)
ALBUMIN: 3.5 g/dL (ref 3.5–5.5)
ALT: 10 IU/L (ref 0–32)
AST: 11 IU/L (ref 0–40)
Alkaline Phosphatase: 57 IU/L (ref 39–117)
BUN / CREAT RATIO: 12 (ref 9–23)
BUN: 15 mg/dL (ref 6–24)
Bilirubin Total: 0.2 mg/dL (ref 0.0–1.2)
CALCIUM: 8.4 mg/dL — AB (ref 8.7–10.2)
CO2: 22 mmol/L (ref 18–29)
CREATININE: 1.29 mg/dL — AB (ref 0.57–1.00)
Chloride: 100 mmol/L (ref 96–106)
GFR, EST AFRICAN AMERICAN: 59 mL/min/{1.73_m2} — AB (ref >59)
GFR, EST NON AFRICAN AMERICAN: 51 mL/min/{1.73_m2} — AB (ref >59)
GLOBULIN, TOTAL: 2.4 g/dL (ref 1.5–4.5)
Glucose: 192 mg/dL — ABNORMAL HIGH (ref 65–99)
Potassium: 4.7 mmol/L (ref 3.5–5.2)
SODIUM: 137 mmol/L (ref 134–144)
Total Protein: 5.9 g/dL — ABNORMAL LOW (ref 6.0–8.5)

## 2016-07-09 ENCOUNTER — Other Ambulatory Visit: Payer: Self-pay | Admitting: Family Medicine

## 2016-08-22 ENCOUNTER — Other Ambulatory Visit: Payer: Self-pay | Admitting: Family Medicine

## 2016-08-22 DIAGNOSIS — D509 Iron deficiency anemia, unspecified: Secondary | ICD-10-CM

## 2016-08-22 DIAGNOSIS — E1140 Type 2 diabetes mellitus with diabetic neuropathy, unspecified: Secondary | ICD-10-CM

## 2016-08-22 DIAGNOSIS — I10 Essential (primary) hypertension: Secondary | ICD-10-CM

## 2016-08-22 DIAGNOSIS — E1142 Type 2 diabetes mellitus with diabetic polyneuropathy: Secondary | ICD-10-CM

## 2016-08-22 IMAGING — CR DG KNEE 1-2V*R*
2 series · 2 of 2 positions shown · non-contrast
Comparison: None.

CLINICAL DATA: Right knee pain after fall.

EXAM:
RIGHT KNEE - 1-2 VIEW

[view not recorded (1 of 2)]
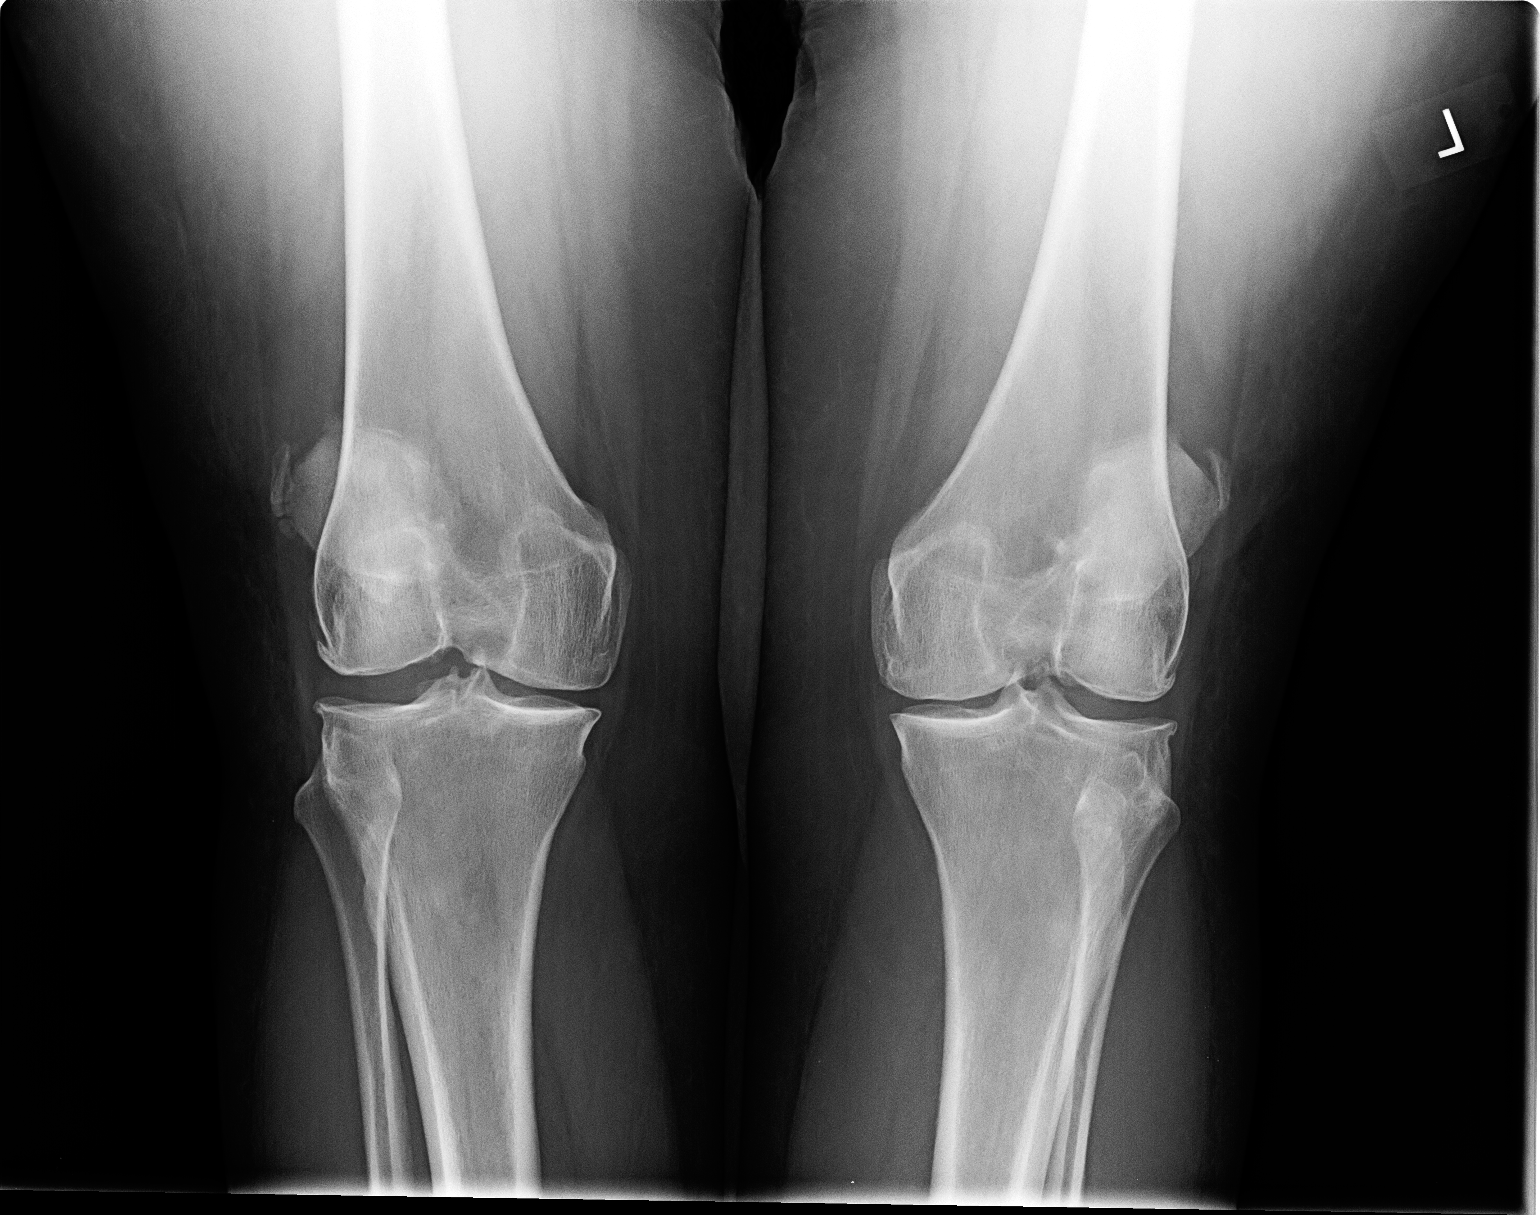

[view not recorded (2 of 2)]
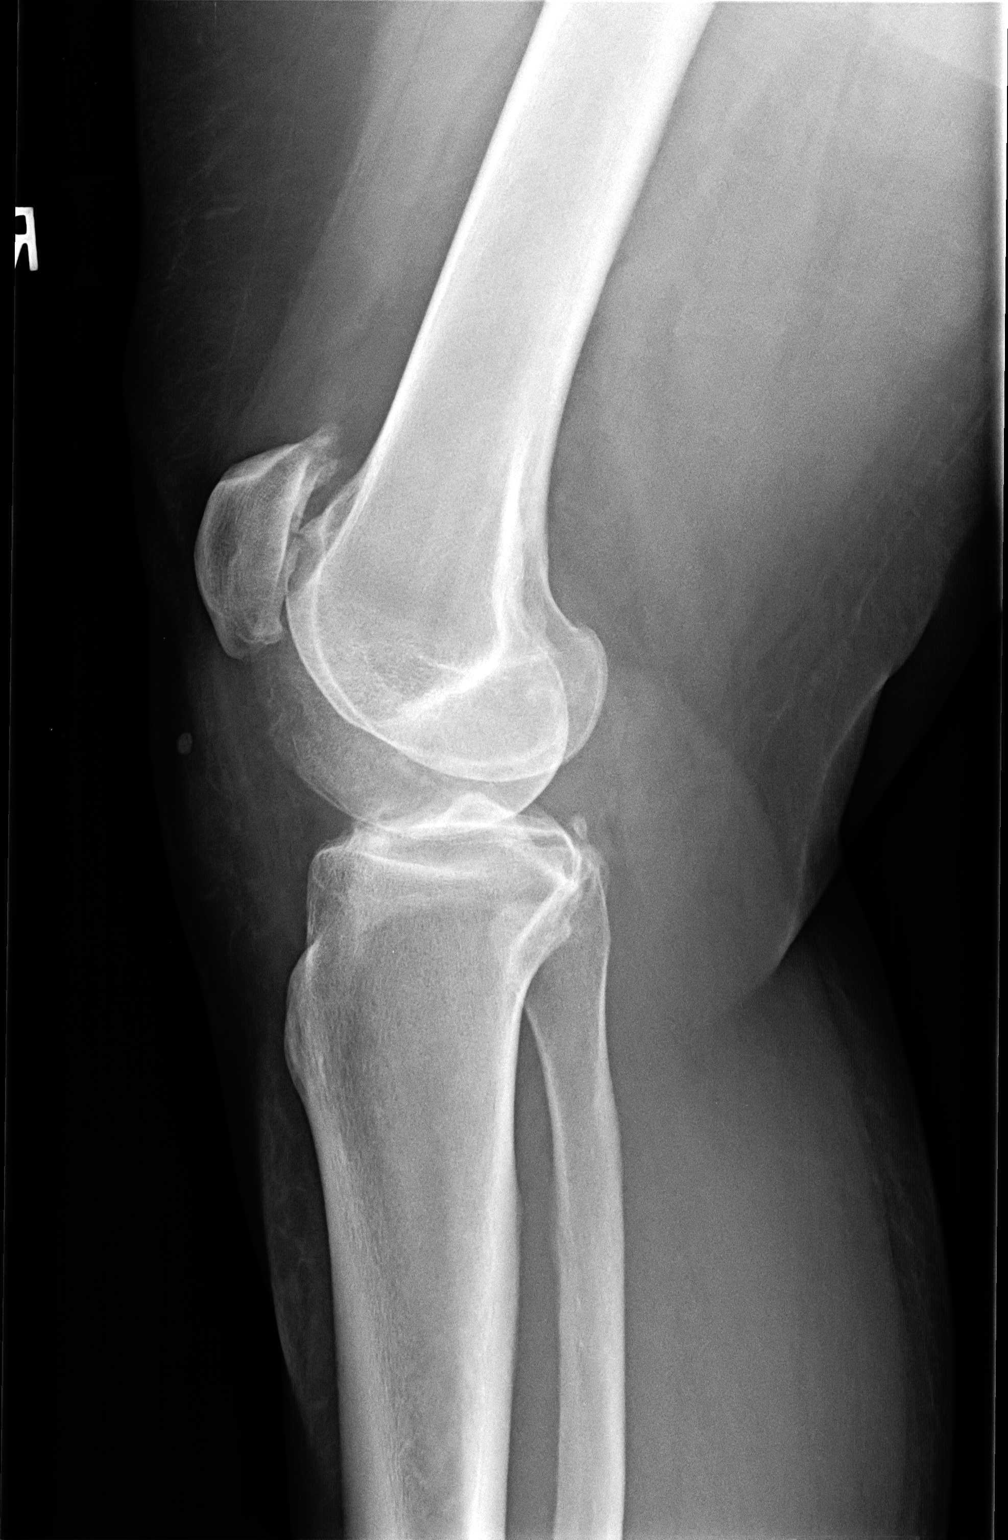

[2 of 2 positions shown; findings below may reference images not displayed]

FINDINGS: There is no evidence of fracture, dislocation, or joint effusion.
Mild narrowing of medial joint space is noted. Osteophyte formation
is seen laterally, as well as involving patella. Spurring of tibial
spines is noted. Soft tissues are unremarkable.
IMPRESSION: Mild degenerative joint disease is noted. No acute abnormality seen
in the right knee.

## 2016-09-06 ENCOUNTER — Ambulatory Visit: Payer: Medicaid Other | Admitting: Family Medicine

## 2016-09-08 ENCOUNTER — Encounter: Payer: Self-pay | Admitting: Family Medicine

## 2016-09-08 ENCOUNTER — Ambulatory Visit (INDEPENDENT_AMBULATORY_CARE_PROVIDER_SITE_OTHER): Payer: Medicaid Other | Admitting: Family Medicine

## 2016-09-08 VITALS — BP 110/76 | HR 83 | Temp 97.1°F | Ht 61.0 in | Wt 209.4 lb

## 2016-09-08 DIAGNOSIS — K219 Gastro-esophageal reflux disease without esophagitis: Secondary | ICD-10-CM | POA: Diagnosis not present

## 2016-09-08 DIAGNOSIS — E781 Pure hyperglyceridemia: Secondary | ICD-10-CM | POA: Diagnosis not present

## 2016-09-08 DIAGNOSIS — E1142 Type 2 diabetes mellitus with diabetic polyneuropathy: Secondary | ICD-10-CM | POA: Diagnosis not present

## 2016-09-08 LAB — BAYER DCA HB A1C WAIVED: HB A1C (BAYER DCA - WAIVED): 6.9 % (ref <7.0)

## 2016-09-08 MED ORDER — FENOFIBRATE 145 MG PO TABS: 145.0000 mg | ORAL_TABLET | Freq: Every day | ORAL | 3 refills | Status: DC

## 2016-09-08 MED ORDER — OMEPRAZOLE 40 MG PO CPDR: 40.0000 mg | DELAYED_RELEASE_CAPSULE | Freq: Every day | ORAL | 3 refills | Status: DC

## 2016-09-08 NOTE — Progress Notes (Signed)
   HPI  Patient presents today here to follow-up for chronic medical conditions.  Type 2 diabetes Doing well, good medication compliance Fasting blood sugar 80-100 Has made several diet changes over the last year. Patient has had 2 episodes of lows in the 60s, she has shaking with this, she recovers with fluids and sugar sweetened fluids.  Hypertriglyceridemia Tolerating fenofibrate easily Needs refill.  GERD Has some breakthrough symptoms with Prilosec, she states these are specific foods and only occurs occasionally. She has good medication compliance with Prilosec, needs refill.  PMH: Smoking status noted ROS: Per HPI  Objective: BP 110/76   Pulse 83   Temp 97.1 F (36.2 C) (Oral)   Ht 5\' 1"  (1.549 m)   Wt 209 lb 6.4 oz (95 kg)   BMI 39.57 kg/m  Gen: NAD, alert, cooperative with exam HEENT: NCAT CV: RRR, good S1/S2, no murmur Resp: CTABL, no wheezes, non-labored Ext: No edema, warm Neuro: Alert and oriented, No gross deficits  Diabetic Foot Exam - Simple   Simple Foot Form Diabetic Foot exam was performed with the following findings:  Yes 09/08/2016  8:33 AM  Visual Inspection See comments:  Yes Sensation Testing Intact to touch and monofilament testing bilaterally:  Yes Pulse Check Posterior Tibialis and Dorsalis pulse intact bilaterally:  Yes Comments Bilateral large bunions medially      Assessment and plan:  # Type 2 diabetes A1c pending, likely stable to slightly improved Continue current medication regimen   # Hypertriglyceridemia Triglycerides previously 597 Tolerating fenofibrate well Labs are up-to-date Refill  # GERD Doing well with omeprazole, needs refill Some breakthrough symptoms, continue daily dosing    Orders Placed This Encounter  Procedures  . Bayer DCA Hb A1c Waived    Meds ordered this encounter  Medications  . fenofibrate (TRICOR) 145 MG tablet    Sig: Take 1 tablet (145 mg total) by mouth daily.    Dispense:  90  tablet    Refill:  3  . omeprazole (PRILOSEC) 40 MG capsule    Sig: Take 1 capsule (40 mg total) by mouth daily.    Dispense:  90 capsule    Refill:  3    Please consider 90 day supplies to promote better adherence    Murtis SinkSam Marabella Popiel, MD [facility] 09/08/2016, 8:35 AM

## 2016-09-26 ENCOUNTER — Other Ambulatory Visit: Payer: Self-pay | Admitting: Family Medicine

## 2016-09-26 DIAGNOSIS — I10 Essential (primary) hypertension: Secondary | ICD-10-CM

## 2016-09-26 DIAGNOSIS — D509 Iron deficiency anemia, unspecified: Secondary | ICD-10-CM

## 2016-09-26 DIAGNOSIS — E1142 Type 2 diabetes mellitus with diabetic polyneuropathy: Secondary | ICD-10-CM

## 2016-09-26 DIAGNOSIS — E1140 Type 2 diabetes mellitus with diabetic neuropathy, unspecified: Secondary | ICD-10-CM

## 2016-10-26 ENCOUNTER — Telehealth: Payer: Self-pay | Admitting: Pharmacist

## 2016-10-26 ENCOUNTER — Ambulatory Visit (INDEPENDENT_AMBULATORY_CARE_PROVIDER_SITE_OTHER): Payer: Medicaid Other | Admitting: Pharmacist

## 2016-10-26 ENCOUNTER — Encounter: Payer: Self-pay | Admitting: Pharmacist

## 2016-10-26 VITALS — BP 108/72 | HR 78 | Ht 61.0 in | Wt 206.5 lb

## 2016-10-26 DIAGNOSIS — Z6839 Body mass index (BMI) 39.0-39.9, adult: Secondary | ICD-10-CM

## 2016-10-26 DIAGNOSIS — E1142 Type 2 diabetes mellitus with diabetic polyneuropathy: Secondary | ICD-10-CM

## 2016-10-26 NOTE — Patient Instructions (Signed)
Try omeprazole / Prilosec over the counter '20mg'$  in place of '40mg'$  - once daily  Diabetes and Standards of Medical Care   Diabetes is complicated. You may find that your diabetes team includes a dietitian, nurse, diabetes educator, eye doctor, and more. To help everyone know what is going on and to help you get the care you deserve, the following schedule of care was developed to help keep you on track. Below are the tests, exams, vaccines, medicines, education, and plans you will need.  Blood Glucose Goals Prior to meals = 80 - 130 Within 2 hours of the start of a meal = less than 180  HbA1c test (goal is less than 7.0% - your last value was 6.9%) This test shows how well you have controlled your glucose over the past 2 to 3 months. It is used to see if your diabetes management plan needs to be adjusted.   It is performed at least 2 times a year if you are meeting treatment goals.  It is performed 4 times a year if therapy has changed or if you are not meeting treatment goals.  Blood pressure test  This test is performed at every routine medical visit. The goal is less than 140/90 mmHg for most people, but 130/80 mmHg in some cases. Ask your health care provider about your goal.  Dental exam  Follow up with the dentist regularly.  Eye exam  If you are diagnosed with type 1 diabetes as a child, get an exam upon reaching the age of 16 years or older and have had diabetes for 3 to 5 years. Yearly eye exams are recommended after that initial eye exam.  If you are diagnosed with type 1 diabetes as an adult, get an exam within 5 years of diagnosis and then yearly.  If you are diagnosed with type 2 diabetes, get an exam as soon as possible after the diagnosis and then yearly.  Foot care exam  Visual foot exams are performed at every routine medical visit. The exams check for cuts, injuries, or other problems with the feet.  A comprehensive foot exam should be done yearly. This includes  visual inspection as well as assessing foot pulses and testing for loss of sensation.  Check your feet nightly for cuts, injuries, or other problems with your feet. Tell your health care provider if anything is not healing.  Kidney function test (urine microalbumin)  This test is performed once a year.  Type 1 diabetes: The first test is performed 5 years after diagnosis.  Type 2 diabetes: The first test is performed at the time of diagnosis.  A serum creatinine and estimated glomerular filtration rate (eGFR) test is done once a year to assess the level of chronic kidney disease (CKD), if present.  Lipid profile (cholesterol, HDL, LDL, triglycerides)  Performed every 5 years for most people.  The goal for LDL is less than 100 mg/dL. If you are at high risk, the goal is less than 70 mg/dL.  The goal for HDL is 40 mg/dL to 50 mg/dL for men and 50 mg/dL to 60 mg/dL for women. An HDL cholesterol of 60 mg/dL or higher gives some protection against heart disease.  The goal for triglycerides is less than 150 mg/dL.  Influenza vaccine, pneumococcal vaccine, and hepatitis B vaccine  The influenza vaccine is recommended yearly.  The pneumococcal vaccine is generally given once in a lifetime. However, there are some instances when another vaccination is recommended. Check with your  health care provider.  The hepatitis B vaccine is also recommended for adults with diabetes.  Diabetes self-management education  Education is recommended at diagnosis and ongoing as needed.  Treatment plan  Your treatment plan is reviewed at every medical visit.  Document Released: 01/02/2009 Document Revised: 11/07/2012 Document Reviewed: 08/07/2012 [facility] Patient Information 2014 [place].

## 2016-10-26 NOTE — Progress Notes (Signed)
Patient ID: Diane Morgan, female   DOB: 1970-10-12, 46 y.o.   MRN: [id number]    Subjective:  Diane Morgan is a 46 y.o. female who presents for recheck type 2 DM and obesity.  I last saw Diane Morgan 04/2016. Current diabetic medications include:  Bydureon 2mg  q week. She has tried metformin in past but was discontinued due to chronic metabolic acidosis per labs 03/07/2016.   Diane Morgan also has several questions about follow up for PAP and mammogram.  Reviewed last result and look like she has had abnormal PAP in 2017 - referred to GYN.  Recommended yearly PAP until 3 normals PAP's.  Last PAP was 06/2015.  She also had mammogram last year which showed abnormality - diagnostic mammogram showed area of calcifications.  Due recheck mammogram.   Diet -  Continues to drink lots of water, non sugar lemonade .  occasional sweet tea that she makes at home with very little sugar.  Continues to follow low CHO diet / limiting portion sizes.  The patient was initially diagnosed with Type 2 diabetes mellitus around age 40yo  Known diabetic complications: peripheral neuropathy  Cardiovascular risk factors: diabetes mellitus, dyslipidemia, family history of premature cardiovascular disease, obesity (BMI >= 30 kg/m2) and sedentary lifestyle  Eye exam current (within one year): Yes Weight trend: decreased by 61# since 03/27/2015 Exercise - decreased over last 4 month - walking some, mowing yard but less scheduled exercise.  Current monitoring regimen:  Checks twice daily Home blood sugar records: BG ranges from 100 to 133 Any episodes of hypoglycemia? denied  Is She on ACE inhibitor or angiotensin II receptor blocker?  Yes  lisinopril (Prinivil)    The following portions of the patient's history were reviewed and updated as appropriate: allergies, current medications, past family history, past medical history, past social history, past surgical history and problem list.  Review of   Systems Review of Systems  Constitutional: Negative.   Eyes: Negative.   Gastrointestinal: Negative.   Genitourinary: Negative.   Neurological: Negative.   Endo/Heme/Allergies: Negative.   Psychiatric/Behavioral: Negative.     Objective:    BP 108/72   Pulse 78   Ht 5\' 1"  (1.549 m)   Wt 206 lb 8 oz (93.7 kg)   BMI 39.02 kg/m    A1c = 6.9% (09/08/2016) A1c = 6.5% (03/07/2016) A1c = 6.7% (12/07/2015) A1c = 6.4% (09/04/2015) A1c = 7.2% (06/02/2015) A1c = 8.1% (02/25/2015)   BMP Latest Ref Rng & Units 06/06/2016 04/04/2016 03/07/2016  Glucose 65 - 99 mg/dL 191(Y) 782(N) 562(Z)  BUN 6 - 24 mg/dL 15 16 11   Creatinine 0.57 - 1.00 mg/dL 3.08(M) 5.78(I) 6.96(E)  BUN/Creat Ratio 9 - 23 12 12 9   Sodium 134 - 144 mmol/L 137 135 138  Potassium 3.5 - 5.2 mmol/L 4.7 4.3 4.4  Chloride 96 - 106 mmol/L 100 102 102  CO2 18 - 29 mmol/L 22 21 16(L)  Calcium 8.7 - 10.2 mg/dL 9.5(M) 8.4(X) 3.2(G)    Assessment:    Diabetes Mellitus type 2 - controlled  Obesity - has lost over  60# over the last 12 months HTN - controlled   Plan:  1.  Rx changes:              Continue Bydureon 2mg  inject SQ weekly 2.  Education: Reviewed 'ABCs' of diabetes management (respective goals in parentheses):  A1C (<7), blood pressure (<130/80), and cholesterol (LDL <100). 3.  Reviewed BG goals and patient is to continue to check  BG 1 to 2  times a day 4.  Continue CHO counting / low calorie diet.   5.  Increase physical activity   - goal is 30 minutes daily for 5 or more days per week 6.  Appt made for PAP and [facility]  7.  Follow up: with PCP in 6 weeks as planned  No orders of the defined types were placed in this encounter.     Henrene Pastorammy Kajal Scalici, PharmD, CPP, CDE  Review of  Systems Review of Systems

## 2016-10-27 ENCOUNTER — Telehealth: Payer: Self-pay | Admitting: Family Medicine

## 2016-10-28 NOTE — Telephone Encounter (Signed)
Scheduled w/[facility] 11/30 at 945

## 2016-11-03 ENCOUNTER — Encounter: Payer: Self-pay | Admitting: Pediatrics

## 2016-11-03 ENCOUNTER — Ambulatory Visit (INDEPENDENT_AMBULATORY_CARE_PROVIDER_SITE_OTHER): Payer: Medicaid Other | Admitting: Pediatrics

## 2016-11-03 VITALS — BP 109/79 | HR 84 | Temp 97.6°F | Ht 61.0 in | Wt 209.2 lb

## 2016-11-03 DIAGNOSIS — R87612 Low grade squamous intraepithelial lesion on cytologic smear of cervix (LGSIL): Secondary | ICD-10-CM

## 2016-11-03 DIAGNOSIS — Z0000 Encounter for general adult medical examination without abnormal findings: Secondary | ICD-10-CM | POA: Diagnosis not present

## 2016-11-03 NOTE — Progress Notes (Signed)
  Subjective:   Patient ID: Diane Morgan, female    DOB: 22-Feb-1971, 46 y.o.   MRN: [id number] CC: annual exam  HPI: Diane Morgan is a 46 y.o. female presenting for annual exam  Saw gynecology 06/2015 for colposcopy after abnormal pap rec q1 yr pap smears for 3 years for follow up  Last mammogram 1 yr ago, has f/u mammogram scheduled in a couple of months  No fam h/o colon cancer  Relevant past medical, surgical, family and social history reviewed. Allergies and medications reviewed and updated. History  Smoking Status  . Never Smoker  Smokeless Tobacco  . Never Used   ROS: All systems negative other than what is in HPI  Objective:    BP 109/79   Pulse 84   Temp 97.6 F (36.4 C) (Oral)   Ht 5\' 1"  (1.549 m)   Wt 209 lb 3.2 oz (94.9 kg)   BMI 39.53 kg/m   Wt Readings from Last 3 Encounters:  11/03/16 209 lb 3.2 oz (94.9 kg)  10/26/16 206 lb 8 oz (93.7 kg)  09/08/16 209 lb 6.4 oz (95 kg)    Gen: NAD, alert, cooperative with exam, NCAT EYES: EOMI, no conjunctival injection, or no icterus ENT:  TMs pearly gray b/l, OP without erythema LYMPH: no cervical LAD CV: NRRR, normal S1/S2, no murmur, distal pulses 2+ b/l Resp: CTABL, no wheezes, normal WOB Abd: +BS, soft, NTND. no guarding or organomegaly Ext: No edema, warm Neuro: Alert and oriented, strength equal b/l UE and LE, coordination grossly normal MSK: normal muscle bulk Breast: nl exam b/l GU: normal external female genitalia, normal appearing cervix, minimally friable, minimal clear discharge present in vaginal vault  Assessment & Plan:  Diane Morgan was seen today for annual exam.  Diagnoses and all orders for this visit:  Encounter for preventive health examination  Colpo 06/2015, needs q6959yr pap smears for 3 years for ASCUS with positive HPV pap -     Pap IG and HPV (high risk) DNA detection Mammogram scheduled Declined pneumovax, wants to wait until due for flu shot this fall  Follow up plan: As scheduled  with pcp Rex Krasarol Vincent, MD [facility]

## 2016-11-03 NOTE — Telephone Encounter (Signed)
Opened in error

## 2016-11-05 LAB — PAP IG AND HPV HIGH-RISK
HPV, HIGH-RISK: POSITIVE — AB
PAP Smear Comment: 0

## 2016-11-07 NOTE — Addendum Note (Signed)
Addended by: Johna Sheriff on: 11/07/2016 07:46 AM   Modules accepted: Orders

## 2016-11-17 ENCOUNTER — Other Ambulatory Visit: Payer: Self-pay | Admitting: Family Medicine

## 2016-11-25 ENCOUNTER — Other Ambulatory Visit: Payer: Self-pay | Admitting: Family Medicine

## 2016-11-25 DIAGNOSIS — D509 Iron deficiency anemia, unspecified: Secondary | ICD-10-CM

## 2016-11-25 DIAGNOSIS — I10 Essential (primary) hypertension: Secondary | ICD-10-CM

## 2016-11-25 DIAGNOSIS — E1140 Type 2 diabetes mellitus with diabetic neuropathy, unspecified: Secondary | ICD-10-CM

## 2016-11-25 DIAGNOSIS — E1142 Type 2 diabetes mellitus with diabetic polyneuropathy: Secondary | ICD-10-CM

## 2016-12-15 ENCOUNTER — Encounter: Payer: Self-pay | Admitting: Family Medicine

## 2016-12-15 ENCOUNTER — Ambulatory Visit (INDEPENDENT_AMBULATORY_CARE_PROVIDER_SITE_OTHER): Payer: Medicaid Other | Admitting: Family Medicine

## 2016-12-15 VITALS — BP 142/99 | HR 71 | Temp 97.4°F | Ht 61.0 in | Wt 209.4 lb

## 2016-12-15 DIAGNOSIS — I10 Essential (primary) hypertension: Secondary | ICD-10-CM

## 2016-12-15 DIAGNOSIS — Z23 Encounter for immunization: Secondary | ICD-10-CM | POA: Diagnosis not present

## 2016-12-15 DIAGNOSIS — M25561 Pain in right knee: Secondary | ICD-10-CM | POA: Diagnosis not present

## 2016-12-15 DIAGNOSIS — E1142 Type 2 diabetes mellitus with diabetic polyneuropathy: Secondary | ICD-10-CM | POA: Diagnosis not present

## 2016-12-15 DIAGNOSIS — E781 Pure hyperglyceridemia: Secondary | ICD-10-CM

## 2016-12-15 LAB — BAYER DCA HB A1C WAIVED: HB A1C: 7.2 % — AB (ref <7.0)

## 2016-12-15 MED ORDER — MUPIROCIN 2 % EX OINT: 1.0000 "application " | TOPICAL_OINTMENT | Freq: Two times a day (BID) | CUTANEOUS | 0 refills | Status: DC

## 2016-12-15 NOTE — Progress Notes (Signed)
   HPI  Patient presents today for follow-up diabetes and other chronic medical conditions also with nasal lesion right knee pain.  Right knee pain One week, no injury Medial and anterior knee pain, sometimes giving out with heavy weight. No locking or popping.  Right nasal lesion Tender, present for about 2 weeks, not responding the Neosporin No fever, chills, sweats.  Diabetes Fasting blood sugar 110 Good medication compliance and tolerance Watching diet  Hypertriglyceridemia Good medication compliance. Hypertension Not checking blood pressure at home, feels that she is more stressed than usual, taking care of a friend's dog last night  PMH: Smoking status noted ROS: Per HPI  Objective: BP (!) 142/99   Pulse 71   Temp (!) 97.4 F (36.3 C) (Oral)   Ht '5\' 1"'$  (1.549 m)   Wt 209 lb 6.4 oz (95 kg)   BMI 39.57 kg/m  Gen: NAD, alert, cooperative with exam HEENT: NCAT, right nose with small amount of erythematous crusting posteriorly within the nares CV: RRR, good S1/S2, no murmur Resp: CTABL, no wheezes, non-labored Ext: No edema, warm Neuro: Alert and oriented, No gross deficits MSK: R knee without erythema, effusion, bruising, or gross deformity No joint line tenderness.  ligamentously intact to Lachman's and with varus and valgus stress.  Negative McMurray's test   Assessment and plan:  # T2 diabetes Previously well-controlled, expected results, A1c pending. No change in medications Patient down from 240 pounds to 209 pounds over the last 18 months.  #Hypertension Elevated today, No changes, likely due to increased stress this morning   right knee pain Possible meniscal injury Not improving in 1-2 weeks would refer to orthopedic surgery  # hypertriglyceridemia Continue fibrate, repeat labs today     Orders Placed This Encounter  Procedures  . Bayer DCA Hb A1c Waived  . CMP14+EGFR  . CBC with Differential/Platelet  . Lipid panel  . TSH    Meds  ordered this encounter  Medications  . mupirocin ointment (BACTROBAN) 2 %    Sig: Place 1 application into the nose 2 (two) times daily.    Dispense:  22 g    Refill:  [place], MD [facility] 12/15/2016, 8:15 AM

## 2016-12-15 NOTE — Patient Instructions (Signed)
Great to see you!  Try mupirocin to your nose for 5 days for the lesion.

## 2016-12-16 ENCOUNTER — Other Ambulatory Visit: Payer: Self-pay

## 2016-12-16 DIAGNOSIS — E875 Hyperkalemia: Secondary | ICD-10-CM

## 2016-12-16 LAB — CBC WITH DIFFERENTIAL/PLATELET
BASOS ABS: 0 10*3/uL (ref 0.0–0.2)
Basos: 0 %
EOS (ABSOLUTE): 0.2 10*3/uL (ref 0.0–0.4)
Eos: 2 %
HEMATOCRIT: 38.6 % (ref 34.0–46.6)
HEMOGLOBIN: 12.4 g/dL (ref 11.1–15.9)
IMMATURE GRANULOCYTES: 0 %
Immature Grans (Abs): 0 10*3/uL (ref 0.0–0.1)
Lymphocytes Absolute: 2.4 10*3/uL (ref 0.7–3.1)
Lymphs: 24 %
MCH: 26.1 pg — ABNORMAL LOW (ref 26.6–33.0)
MCHC: 32.1 g/dL (ref 31.5–35.7)
MCV: 81 fL (ref 79–97)
MONOS ABS: 0.6 10*3/uL (ref 0.1–0.9)
Monocytes: 6 %
NEUTROS PCT: 68 %
Neutrophils Absolute: 7.1 10*3/uL — ABNORMAL HIGH (ref 1.4–7.0)
Platelets: 315 10*3/uL (ref 150–379)
RBC: 4.75 x10E6/uL (ref 3.77–5.28)
RDW: 16.4 % — AB (ref 12.3–15.4)
WBC: 10.3 10*3/uL (ref 3.4–10.8)

## 2016-12-16 LAB — LIPID PANEL
CHOL/HDL RATIO: 4.7 ratio — AB (ref 0.0–4.4)
CHOLESTEROL TOTAL: 121 mg/dL (ref 100–199)
HDL: 26 mg/dL — AB (ref >39)
LDL CALC: 59 mg/dL (ref 0–99)
TRIGLYCERIDES: 179 mg/dL — AB (ref 0–149)
VLDL Cholesterol Cal: 36 mg/dL (ref 5–40)

## 2016-12-16 LAB — CMP14+EGFR
A/G RATIO: 1.6 (ref 1.2–2.2)
ALK PHOS: 62 IU/L (ref 39–117)
ALT: 8 IU/L (ref 0–32)
AST: 10 IU/L (ref 0–40)
Albumin: 3.6 g/dL (ref 3.5–5.5)
BUN/Creatinine Ratio: 14 (ref 9–23)
BUN: 21 mg/dL (ref 6–24)
Bilirubin Total: 0.2 mg/dL (ref 0.0–1.2)
CHLORIDE: 103 mmol/L (ref 96–106)
CO2: 21 mmol/L (ref 20–29)
Calcium: 8.5 mg/dL — ABNORMAL LOW (ref 8.7–10.2)
Creatinine, Ser: 1.45 mg/dL — ABNORMAL HIGH (ref 0.57–1.00)
GFR calc Af Amer: 51 mL/min/{1.73_m2} — ABNORMAL LOW (ref >59)
GFR calc non Af Amer: 44 mL/min/{1.73_m2} — ABNORMAL LOW (ref >59)
GLOBULIN, TOTAL: 2.3 g/dL (ref 1.5–4.5)
Glucose: 184 mg/dL — ABNORMAL HIGH (ref 65–99)
POTASSIUM: 5.4 mmol/L — AB (ref 3.5–5.2)
SODIUM: 138 mmol/L (ref 134–144)
Total Protein: 5.9 g/dL — ABNORMAL LOW (ref 6.0–8.5)

## 2016-12-16 LAB — TSH: TSH: 1.82 u[IU]/mL (ref 0.450–4.500)

## 2016-12-17 ENCOUNTER — Telehealth: Payer: Self-pay | Admitting: Family Medicine

## 2016-12-19 NOTE — Telephone Encounter (Signed)
Patient aware of results.

## 2016-12-28 ENCOUNTER — Other Ambulatory Visit: Payer: Medicaid Other

## 2016-12-28 DIAGNOSIS — E875 Hyperkalemia: Secondary | ICD-10-CM

## 2016-12-28 LAB — CMP14+EGFR
A/G RATIO: 1.6 (ref 1.2–2.2)
ALT: 9 IU/L (ref 0–32)
AST: 11 IU/L (ref 0–40)
Albumin: 3.6 g/dL (ref 3.5–5.5)
Alkaline Phosphatase: 56 IU/L (ref 39–117)
BUN/Creatinine Ratio: 15 (ref 9–23)
BUN: 19 mg/dL (ref 6–24)
Bilirubin Total: 0.2 mg/dL (ref 0.0–1.2)
CALCIUM: 9.2 mg/dL (ref 8.7–10.2)
CO2: 24 mmol/L (ref 20–29)
Chloride: 104 mmol/L (ref 96–106)
Creatinine, Ser: 1.27 mg/dL — ABNORMAL HIGH (ref 0.57–1.00)
GFR, EST AFRICAN AMERICAN: 59 mL/min/{1.73_m2} — AB (ref >59)
GFR, EST NON AFRICAN AMERICAN: 51 mL/min/{1.73_m2} — AB (ref >59)
GLOBULIN, TOTAL: 2.2 g/dL (ref 1.5–4.5)
Glucose: 148 mg/dL — ABNORMAL HIGH (ref 65–99)
POTASSIUM: 5.6 mmol/L — AB (ref 3.5–5.2)
SODIUM: 141 mmol/L (ref 134–144)
TOTAL PROTEIN: 5.8 g/dL — AB (ref 6.0–8.5)

## 2016-12-29 ENCOUNTER — Telehealth: Payer: Self-pay | Admitting: Family Medicine

## 2016-12-29 NOTE — Telephone Encounter (Signed)
Refer to lab note °

## 2016-12-30 ENCOUNTER — Other Ambulatory Visit: Payer: Self-pay | Admitting: Family Medicine

## 2017-01-05 ENCOUNTER — Encounter: Payer: Self-pay | Admitting: *Deleted

## 2017-01-17 ENCOUNTER — Other Ambulatory Visit: Payer: Medicaid Other

## 2017-01-17 DIAGNOSIS — E875 Hyperkalemia: Secondary | ICD-10-CM

## 2017-01-17 LAB — BMP8+EGFR
BUN/Creatinine Ratio: 12 (ref 9–23)
BUN: 15 mg/dL (ref 6–24)
CALCIUM: 9.1 mg/dL (ref 8.7–10.2)
CHLORIDE: 99 mmol/L (ref 96–106)
CO2: 24 mmol/L (ref 20–29)
Creatinine, Ser: 1.3 mg/dL — ABNORMAL HIGH (ref 0.57–1.00)
GFR calc Af Amer: 58 mL/min/{1.73_m2} — ABNORMAL LOW (ref >59)
GFR calc non Af Amer: 50 mL/min/{1.73_m2} — ABNORMAL LOW (ref >59)
GLUCOSE: 169 mg/dL — AB (ref 65–99)
POTASSIUM: 4.8 mmol/L (ref 3.5–5.2)
Sodium: 136 mmol/L (ref 134–144)

## 2017-02-02 ENCOUNTER — Encounter: Payer: Self-pay | Admitting: Obstetrics & Gynecology

## 2017-02-02 ENCOUNTER — Ambulatory Visit: Payer: Medicaid Other | Admitting: Obstetrics & Gynecology

## 2017-02-02 VITALS — BP 128/75 | HR 76 | Ht 61.0 in | Wt 215.0 lb

## 2017-02-02 DIAGNOSIS — Z3202 Encounter for pregnancy test, result negative: Secondary | ICD-10-CM | POA: Diagnosis not present

## 2017-02-02 DIAGNOSIS — N870 Mild cervical dysplasia: Secondary | ICD-10-CM

## 2017-02-02 DIAGNOSIS — N871 Moderate cervical dysplasia: Secondary | ICD-10-CM | POA: Diagnosis not present

## 2017-02-02 LAB — POCT URINE PREGNANCY: Preg Test, Ur: NEGATIVE

## 2017-02-02 NOTE — Progress Notes (Signed)
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   New sexual partner:  No.  :  History of abnormal Pap: yes  Procedure Details  The risks and benefits of the procedure and Written informed consent obtained.  Speculum placed in vagina and excellent visualization of cervix achieved, cervix swabbed x 3 with acetic acid solution.  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  Monsel solution was used to Obtain hemostasis Vaginal inspection: normal without visible lesions. Vulvar colposcopy: vulvar colposcopy not performed.  Specimens: cervical biopsy at 1 o clock  Complications: none.  Plan: Return in 1 week to discuss the pathological findings

## 2017-02-02 NOTE — Addendum Note (Signed)
Addended by: Sherre LainASH, AMANDA A on: 02/02/2017 04:36 PM   Modules accepted: Orders

## 2017-02-03 ENCOUNTER — Other Ambulatory Visit: Payer: Self-pay | Admitting: Obstetrics & Gynecology

## 2017-02-06 ENCOUNTER — Other Ambulatory Visit: Payer: Self-pay | Admitting: Family Medicine

## 2017-02-06 DIAGNOSIS — E1140 Type 2 diabetes mellitus with diabetic neuropathy, unspecified: Secondary | ICD-10-CM

## 2017-02-06 DIAGNOSIS — E1142 Type 2 diabetes mellitus with diabetic polyneuropathy: Secondary | ICD-10-CM

## 2017-02-06 DIAGNOSIS — I10 Essential (primary) hypertension: Secondary | ICD-10-CM

## 2017-02-06 DIAGNOSIS — D509 Iron deficiency anemia, unspecified: Secondary | ICD-10-CM

## 2017-02-13 ENCOUNTER — Encounter: Payer: Self-pay | Admitting: Obstetrics & Gynecology

## 2017-02-13 ENCOUNTER — Ambulatory Visit: Payer: Medicaid Other | Admitting: Obstetrics & Gynecology

## 2017-02-13 ENCOUNTER — Other Ambulatory Visit: Payer: Self-pay

## 2017-02-13 VITALS — BP 128/86 | HR 82 | Ht 65.0 in | Wt 213.0 lb

## 2017-02-13 DIAGNOSIS — R87613 High grade squamous intraepithelial lesion on cytologic smear of cervix (HGSIL): Secondary | ICD-10-CM | POA: Diagnosis not present

## 2017-02-13 NOTE — Progress Notes (Signed)
Preoperative History and Physical  Diane Morgan is a 46 y.o. G0P0000 with Patient's last menstrual period was 01/04/2017. admitted for a laser ablation of the cervix for HSIL, extensive large lesion.  Not even a candidate for conization would essentially be a cervical amputation  PMH:    Past Medical History:  Diagnosis Date  . Cellulitis and abscess of trunk 11/28/2012  . Diabetic peripheral neuropathy ([place])   . DM (diabetes mellitus) ([place])   . Eczema   . GERD (gastroesophageal reflux disease)   . Hyperlipidemia   . Hypertension   . Leg pain, bilateral 10/08/09  . NIDDM (non-insulin dependent diabetes mellitus)   . URI (upper respiratory infection) 07/01/10    PSH:     Past Surgical History:  Procedure Laterality Date  . CHOLECYSTECTOMY    . WISDOM TOOTH EXTRACTION      POb/GynH:      OB History    Gravida Para Term Preterm AB Living   0 0 0 0 0 0   SAB TAB Ectopic Multiple Live Births   0 0 0 0 0      SH:   Social History   Tobacco Use  . Smoking status: Never Smoker  . Smokeless tobacco: Never Used  Substance Use Topics  . Alcohol use: No  . Drug use: No    FH:    Family History  Problem Relation Age of Onset  . Diabetes Father   . Hypertension Mother   . Hyperlipidemia Mother   . Heart disease Maternal Grandmother   . Diabetes Paternal Grandmother   . Diabetes Paternal Grandfather      Allergies:  Allergies  Allergen Reactions  . Penicillins Anaphylaxis and Hives    Childhood Reaction.  . Niaspan [Niacin] Other (See Comments)    Patient passes out.    Medications:       Current Outpatient Medications:  .  aspirin 81 MG chewable tablet, Chew 81 mg by mouth daily.  , Disp: , Rfl:  .  atorvastatin (LIPITOR) 40 MG tablet, TAKE ONE TABLET BY MOUTH ONCE DAILY, Disp: 90 tablet, Rfl: 0 .  Blood Glucose Monitoring Suppl (ACCU-CHEK AVIVA PLUS) w/Device KIT, 1 Device by Does not apply route as needed., Disp: 1 kit, Rfl: 0 .  BYDUREON 2 MG PEN, INJECT 1  DOSE (2 MG) SUBCUTANEOUSLY ONCE A WEEK, Disp: 4 each, Rfl: 1 .  cetirizine (ZYRTEC) 10 MG tablet, Take 10 mg by mouth daily., Disp: , Rfl:  .  fenofibrate (TRICOR) 145 MG tablet, Take 1 tablet (145 mg total) by mouth daily., Disp: 90 tablet, Rfl: 3 .  ferrous sulfate 325 (65 FE) MG tablet, Take 1 tablet (325 mg total) by mouth 2 (two) times daily with a meal. (Patient taking differently: Take 325 mg by mouth daily. ), Disp: 60 tablet, Rfl: 3 .  gabapentin (NEURONTIN) 300 MG capsule, TAKE 1 CAPSULE BY MOUTH THREE TIMES DAILY, Disp: 270 capsule, Rfl: 0 .  glucose blood (ACCU-CHEK AVIVA) test strip, Use as instructed, Disp: 100 each, Rfl: 12 .  Lancet Devices (ACCU-CHEK SOFTCLIX) lancets, Use as instructed, Disp: 1 each, Rfl: 0 .  levothyroxine (SYNTHROID, LEVOTHROID) 50 MCG tablet, TAKE ONE TABLET BY MOUTH ONCE DAILY, Disp: 90 tablet, Rfl: 3 .  mupirocin ointment (BACTROBAN) 2 %, Place 1 application into the nose 2 (two) times daily., Disp: 22 g, Rfl: 0 .  omeprazole (PRILOSEC) 40 MG capsule, Take 1 capsule (40 mg total) by mouth daily., Disp: 90 capsule, Rfl: 3  Review of Systems:   Review of Systems  Constitutional: Negative for fever, chills, weight loss, malaise/fatigue and diaphoresis.  HENT: Negative for hearing loss, ear pain, nosebleeds, congestion, sore throat, neck pain, tinnitus and ear discharge.   Eyes: Negative for blurred vision, double vision, photophobia, pain, discharge and redness.  Respiratory: Negative for cough, hemoptysis, sputum production, shortness of breath, wheezing and stridor.   Cardiovascular: Negative for chest pain, palpitations, orthopnea, claudication, leg swelling and PND.  Gastrointestinal: Positive for abdominal pain. Negative for heartburn, nausea, vomiting, diarrhea, constipation, blood in stool and melena.  Genitourinary: Negative for dysuria, urgency, frequency, hematuria and flank pain.  Musculoskeletal: Negative for myalgias, back pain, joint pain and  falls.  Skin: Negative for itching and rash.  Neurological: Negative for dizziness, tingling, tremors, sensory change, speech change, focal weakness, seizures, loss of consciousness, weakness and headaches.  Endo/Heme/Allergies: Negative for environmental allergies and polydipsia. Does not bruise/bleed easily.  Psychiatric/Behavioral: Negative for depression, suicidal ideas, hallucinations, memory loss and substance abuse. The patient is not nervous/anxious and does not have insomnia.      PHYSICAL EXAM:  Blood pressure 128/86, pulse 82, height _0  (1.651 m), weight 213 lb (96.6 kg), last menstrual period 01/04/2017.    Vitals reviewed. Constitutional: She is oriented to person, place, and time. She appears well-developed and well-nourished.  HENT:  Head: Normocephalic and atraumatic.  Right Ear: External ear normal.  Left Ear: External ear normal.  Nose: Nose normal.  Mouth/Throat: Oropharynx is clear and moist.  Eyes: Conjunctivae and EOM are normal. Pupils are equal, round, and reactive to light. Right eye exhibits no discharge. Left eye exhibits no discharge. No scleral icterus.  Neck: Normal range of motion. Neck supple. No tracheal deviation present. No thyromegaly present.  Cardiovascular: Normal rate, regular rhythm, normal heart sounds and intact distal pulses.  Exam reveals no gallop and no friction rub.   No murmur heard. Respiratory: Effort normal and breath sounds normal. No respiratory distress. She has no wheezes. She has no rales. She exhibits no tenderness.  GI: Soft. Bowel sounds are normal. She exhibits no distension and no mass. There is tenderness. There is no rebound and no guarding.  Genitourinary:       Vulva is normal without lesions Vagina is pink moist without discharge Cervix see colpo note Uterus is normal size, contour, position, consistency, mobility, non-tender Adnexa is negative with normal sized ovaries by sonogram  Musculoskeletal: Normal range of  motion. She exhibits no edema and no tenderness.  Neurological: She is alert and oriented to person, place, and time. She has normal reflexes. She displays normal reflexes. No cranial nerve deficit. She exhibits normal muscle tone. Coordination normal.  Skin: Skin is warm and dry. No rash noted. No erythema. No pallor.  Psychiatric: She has a normal mood and affect. Her behavior is normal. Judgment and thought content normal.    Labs: Results for orders placed or performed in visit on 02/02/17 (from the past 336 hour(s))  POCT urine pregnancy   Collection Time: 02/02/17 10:36 AM  Result Value Ref Range   Preg Test, Ur Negative Negative    EKG: Orders placed or performed during the hospital encounter of 11/28/12  . EKG test  . EKG test  . EKG    Imaging Studies: No results found.    Assessment: HSIL of cervix, very large extensive lesion   Patient Active Problem List   Diagnosis Date Noted  . Metabolic acidosis 48/18/5631  . Microcytosis 03/07/2016  .  Hypertriglyceridemia 12/08/2015  . Carpal tunnel syndrome on left 06/02/2015  . DUB (dysfunctional uterine bleeding) 03/27/2015  . Subclinical hypothyroidism 12/26/2014  . Cellulitis and abscess 11/28/2012  . GERD (gastroesophageal reflux disease) 11/28/2012  . Hypertension 08/17/2012  . Hyperlipidemia 08/17/2012  . Morbid obesity ([place]) 08/17/2012  . T2DM (type 2 diabetes mellitus) ([place]) 08/17/2012    Plan: Laser ablation of the cervix 03/09/2015 Discussed the results and findings as well as the management of laser surgery  Florian Buff 02/13/2017 4:55 PM     Face to face time:  25 minutes  Greater than 50% of the visit time was spent in counseling and coordination of care with the patient.  The summary and outline of the counseling and care coordination is summarized in the note above.   All questions were answered.

## 2017-02-14 ENCOUNTER — Telehealth: Payer: Self-pay | Admitting: Obstetrics & Gynecology

## 2017-02-14 NOTE — Telephone Encounter (Signed)
Noted and I agree that the form should be filled out

## 2017-03-01 ENCOUNTER — Other Ambulatory Visit: Payer: Self-pay | Admitting: Obstetrics & Gynecology

## 2017-03-02 NOTE — Patient Instructions (Signed)
Diane Morgan  03/02/2017     @PREFPERIOPPHARMACY @   Your procedure is scheduled on  03/08/2017   Report to [facility] at  715   A.M.  Call this number if you have problems the morning of surgery:  [phone]   Remember:  Do not eat food or drink liquids after midnight.  Take these medicines the morning of surgery with A SIP OF WATER  Neurontin, levothyroxine, prilosec.   Do not wear jewelry, make-up or nail polish.  Do not wear lotions, powders, or perfumes, or deodorant.  Do not shave 48 hours prior to surgery.  Men may shave face and neck.  Do not bring valuables to the hospital.  [facility] is not responsible for any belongings or valuables.  Contacts, dentures or bridgework may not be worn into surgery.  Leave your suitcase in the car.  After surgery it may be brought to your room.  For patients admitted to the hospital, discharge time will be determined by your treatment team.  Patients discharged the day of surgery will not be allowed to drive home.   Name and phone number of your driver:   family Special instructions:  None  Please read over the following fact sheets that you were given. Anesthesia Post-op Instructions and Care and Recovery After Surgery       Cervical Laser Surgery Cervical laser surgery is a procedure that uses a focused beam of light (laser) to shrink, destroy, or remove tissue from the opening of the uterus (cervix). You may have this surgery if:  You have abnormal cells (dysplasia) in your cervix that are an early sign of cancer.  Tissue from your cervix is to be tested for signs of disease.  Tell a health care provider about:  Any allergies you have.  All medicines you are taking, including vitamins, herbs, eye drops, creams, and over-the-counter medicines.  Any problems you or family members have had with anesthetic medicines.  Any blood disorders you have.  Any surgeries you have had.  Any medical  conditions you have.  Whether you are pregnant or may be pregnant. What are the risks? Generally, this is a safe procedure. However, problems may occur, including:  Bleeding.  Infection.  Allergic reactions to medicines or dyes.  Damage to other structures or organs.  Narrowing of the cervix (cervical stenosis). This can lead to infertility, cervical incompetency, and miscarriage or preterm labor in a future pregnancy.  What happens before the procedure?  Ask your health care provider about changing or stopping your regular medicines. This is especially important if you are taking diabetes medicines or blood thinners.  For at least 24 hours before the procedure: ? Do not have sex. ? Do not use tampons. ? Do not douche. ? Do not use vaginal creams or medicines. What happens during the procedure?  To lower your risk of infection, your health care team will wash or sanitize their hands.  You will lie on your back with your feet raised in footrests (stirrups).  A device called a speculum will be put into your vagina to hold it open.  You will be given a medicine to numb the area (local anesthetic). You may feel cramping when the medicine is injected.  A magnifying instrument called a colposcope will be placed into your vagina. It will shine a light and allow your health care provider to see your vagina and cervix.  A solution will be  swabbed on your cervix to help your health care provider see the tissue.  A thin, flexible tube called an endoscope will be inserted into your vagina. The laser will be on the end of the endoscope.  The laser will be used to shrink, destroy, or remove the tissue.  A cotton swab soaked in solution may be used to remove any burned or destroyed tissue.  A paste may be applied to your cervix to stop bleeding. The procedure may vary among health care providers and hospitals. What happens after the procedure?  You may feel some pain or cramping for  a few days.  You may have some bleeding and brownish discharge.  Wear sanitary pads for discharge.  Do not have sex or put anything in your vagina until your health care provider says it is okay.  Your health care provider may recommend that you limit your physical activity for a few days. Ask your health care provider what activities are safe for you. Summary  Cervical laser surgery is a procedure that uses a focused beam of light (laser) to shrink, destroy, or remove tissue from the cervix.  You may have this procedure if you have abnormal cells in your cervix that are an early sign of cancer, or if tissue from your cervix is to be tested for signs of disease.  Generally, this is a safe procedure. However, problems may occur.  Do not have sex, use tampons, douche, or use vaginal creams or medicines for at least 24 hours before the procedure.  You may have pain, cramping, bleeding, and discharge for a few days after the procedure. This information is not intended to replace advice given to you by your health care provider. Make sure you discuss any questions you have with your health care provider. Document Released: 01/25/2016 Document Revised: 01/25/2016 Document Reviewed: 01/25/2016 Elsevier Interactive Patient Education  2018 Elsevier Inc.  Cervical Laser Surgery, Care After This sheet gives you information about how to care for yourself after your procedure. Your health care provider may also give you more specific instructions. If you have problems or questions, contact your health care provider. What can I expect after the procedure? After the procedure, it is common to have:  Pain or discomfort.  Mild cramping.  Bleeding, spotting, or brownish discharge from your vagina.  Follow these instructions at home: Activity  Return to your normal activities as told by your health care provider. Ask your health care provider what activities are safe for you.  Do not lift  anything that is heavier than 10 lb (4.5 kg), or the limit that your health care provider tells you, until he or she says that it is safe.  Do not have sex or put anything in your vagina until your health care provider says it is okay. General instructions  Take over-the-counter and prescription medicines only as told by your health care provider.  Do not drive or use heavy machinery while taking prescription pain medicine.  Wear sanitary pads to protect from bleeding, spotting, and discharge.  Do not use tampons or douche until your health care provider says it is okay.  It is up to you to get the results of your procedure. Ask your health care provider, or the department that is doing the procedure, when your results will be ready.  Keep all follow-up visits as told by your health care provider. This is important. Contact a health care provider if:  Your pain or cramping does not improve.  Your periods are more painful than usual.  You do not get your period as expected. Get help right away if:  You have any symptoms of infection, such as: ? A fever. ? Chills. ? Discharge that smells bad.  You have severe pain in your abdomen.  You have heavy bleeding from your vagina (more than a normal period).  You have vaginal bleeding with clumps of blood (blood clots). Summary  After this procedure, it is common to have pain or discomfort and mild cramping. It is also common to have bleeding, spotting, or brownish discharge from your vagina.  You may need to wear sanitary pads to protect from bleeding, spotting, and discharge.  Do not have sex, use tampons, or douche until your health care provider says it is okay.  Return to your normal activities as told by your health care provider. Ask your health care provider what activities are safe for you.  Take over-the-counter and prescription medicines only as told by your health care provider. These include medicines for pain. This  information is not intended to replace advice given to you by your health care provider. Make sure you discuss any questions you have with your health care provider. Document Released: 01/25/2016 Document Revised: 01/25/2016 Document Reviewed: 01/25/2016 Elsevier Interactive Patient Education  2018 [employer]  General Anesthesia, Adult General anesthesia is the use of medicines to make a person "go to sleep" (be unconscious) for a medical procedure. General anesthesia is often recommended when a procedure:  Is long.  Requires you to be still or in an unusual position.  Is major and can cause you to lose blood.  Is impossible to do without general anesthesia.  The medicines used for general anesthesia are called general anesthetics. In addition to making you sleep, the medicines:  Prevent pain.  Control your blood pressure.  Relax your muscles.  Tell a health care provider about:  Any allergies you have.  All medicines you are taking, including vitamins, herbs, eye drops, creams, and over-the-counter medicines.  Any problems you or family members have had with anesthetic medicines.  Types of anesthetics you have had in the past.  Any bleeding disorders you have.  Any surgeries you have had.  Any medical conditions you have.  Any history of heart or lung conditions, such as heart failure, sleep apnea, or chronic obstructive pulmonary disease (COPD).  Whether you are pregnant or may be pregnant.  Whether you use tobacco, alcohol, marijuana, or street drugs.  Any history of Financial plannermilitary service.  Any history of depression or anxiety. What are the risks? Generally, this is a safe procedure. However, problems may occur, including:  Allergic reaction to anesthetics.  Lung and heart problems.  Inhaling food or liquids from your stomach into your lungs (aspiration).  Injury to nerves.  Waking up during your procedure and being unable to move (rare).  Extreme  agitation or a state of mental confusion (delirium) when you wake up from the anesthetic.  Air in the bloodstream, which can lead to stroke.  These problems are more likely to develop if you are having a major surgery or if you have an advanced medical condition. You can prevent some of these complications by answering all of your health care provider's questions thoroughly and by following all pre-procedure instructions. General anesthesia can cause side effects, including:  Nausea or vomiting  A sore throat from the breathing tube.  Feeling cold or shivery.  Feeling tired, washed out, or achy.  Sleepiness or  drowsiness.  Confusion or agitation.  What happens before the procedure? Staying hydrated Follow instructions from your health care provider about hydration, which may include:  Up to 2 hours before the procedure - you may continue to drink clear liquids, such as water, clear fruit juice, black coffee, and plain tea.  Eating and drinking restrictions Follow instructions from your health care provider about eating and drinking, which may include:  8 hours before the procedure - stop eating heavy meals or foods such as meat, fried foods, or fatty foods.  6 hours before the procedure - stop eating light meals or foods, such as toast or cereal.  6 hours before the procedure - stop drinking milk or drinks that contain milk.  2 hours before the procedure - stop drinking clear liquids.  Medicines  Ask your health care provider about: ? Changing or stopping your regular medicines. This is especially important if you are taking diabetes medicines or blood thinners. ? Taking medicines such as aspirin and ibuprofen. These medicines can thin your blood. Do not take these medicines before your procedure if your health care provider instructs you not to. ? Taking new dietary supplements or medicines. Do not take these during the week before your procedure unless your health care  provider approves them.  If you are told to take a medicine or to continue taking a medicine on the day of the procedure, take the medicine with sips of water. General instructions   Ask if you will be going home the same day, the following day, or after a longer hospital stay. ? Plan to have someone take you home. ? Plan to have someone stay with you for the first 24 hours after you leave the hospital or clinic.  For 3-6 weeks before the procedure, try not to use any tobacco products, such as cigarettes, chewing tobacco, and e-cigarettes.  You may brush your teeth on the morning of the procedure, but make sure to spit out the toothpaste. What happens during the procedure?  You will be given anesthetics through a mask and through an IV tube in one of your veins.  You may receive medicine to help you relax (sedative).  As soon as you are asleep, a breathing tube may be used to help you breathe.  An anesthesia specialist will stay with you throughout the procedure. He or she will help keep you comfortable and safe by continuing to give you medicines and adjusting the amount of medicine that you get. He or she will also watch your blood pressure, pulse, and oxygen levels to make sure that the anesthetics do not cause any problems.  If a breathing tube was used to help you breathe, it will be removed before you wake up. The procedure may vary among health care providers and hospitals. What happens after the procedure?  You will wake up, often slowly, after the procedure is complete, usually in a recovery area.  Your blood pressure, heart rate, breathing rate, and blood oxygen level will be monitored until the medicines you were given have worn off.  You may be given medicine to help you calm down if you feel anxious or agitated.  If you will be going home the same day, your health care provider may check to make sure you can stand, drink, and urinate.  Your health care providers will  treat your pain and side effects before you go home.  Do not drive for 24 hours if you received a sedative.  You  may: ? Feel nauseous and vomit. ? Have a sore throat. ? Have mental slowness. ? Feel cold or shivery. ? Feel sleepy. ? Feel tired. ? Feel sore or achy, even in parts of your body where you did not have surgery. This information is not intended to replace advice given to you by your health care provider. Make sure you discuss any questions you have with your health care provider. Document Released: 06/14/2007 Document Revised: 08/18/2015 Document Reviewed: 02/19/2015 Elsevier Interactive Patient Education  2018 [employer] General Anesthesia, Adult, Care After These instructions provide you with information about caring for yourself after your procedure. Your health care provider may also give you more specific instructions. Your treatment has been planned according to current medical practices, but problems sometimes occur. Call your health care provider if you have any problems or questions after your procedure. What can I expect after the procedure? After the procedure, it is common to have:  Vomiting.  A sore throat.  Mental slowness.  It is common to feel:  Nauseous.  Cold or shivery.  Sleepy.  Tired.  Sore or achy, even in parts of your body where you did not have surgery.  Follow these instructions at home: For at least 24 hours after the procedure:  Do not: ? Participate in activities where you could fall or become injured. ? Drive. ? Use heavy machinery. ? Drink alcohol. ? Take sleeping pills or medicines that cause drowsiness. ? Make important decisions or sign legal documents. ? Take care of children on your own.  Rest. Eating and drinking  If you vomit, drink water, juice, or soup when you can drink without vomiting.  Drink enough fluid to keep your urine clear or pale yellow.  Make sure you have little or no nausea before eating solid  foods.  Follow the diet recommended by your health care provider. General instructions  Have a responsible adult stay with you until you are awake and alert.  Return to your normal activities as told by your health care provider. Ask your health care provider what activities are safe for you.  Take over-the-counter and prescription medicines only as told by your health care provider.  If you smoke, do not smoke without supervision.  Keep all follow-up visits as told by your health care provider. This is important. Contact a health care provider if:  You continue to have nausea or vomiting at home, and medicines are not helpful.  You cannot drink fluids or start eating again.  You cannot urinate after 8-12 hours.  You develop a skin rash.  You have fever.  You have increasing redness at the site of your procedure. Get help right away if:  You have difficulty breathing.  You have chest pain.  You have unexpected bleeding.  You feel that you are having a life-threatening or urgent problem. This information is not intended to replace advice given to you by your health care provider. Make sure you discuss any questions you have with your health care provider. Document Released: 06/13/2000 Document Revised: 08/10/2015 Document Reviewed: 02/19/2015 Elsevier Interactive Patient Education  [employer]2018 Elsevier Inc.

## 2017-03-03 ENCOUNTER — Encounter (HOSPITAL_COMMUNITY)
Admission: RE | Admit: 2017-03-03 | Discharge: 2017-03-03 | Disposition: A | Discharge status: Home / Self Care | Payer: Medicaid Other | Source: Ambulatory Visit | Attending: Obstetrics & Gynecology | Admitting: Obstetrics & Gynecology

## 2017-03-03 ENCOUNTER — Other Ambulatory Visit: Payer: Self-pay

## 2017-03-03 ENCOUNTER — Encounter (HOSPITAL_COMMUNITY): Payer: Self-pay

## 2017-03-03 DIAGNOSIS — R87613 High grade squamous intraepithelial lesion on cytologic smear of cervix (HGSIL): Secondary | ICD-10-CM | POA: Insufficient documentation

## 2017-03-03 DIAGNOSIS — Z79899 Other long term (current) drug therapy: Secondary | ICD-10-CM | POA: Insufficient documentation

## 2017-03-03 DIAGNOSIS — Z880 Allergy status to penicillin: Secondary | ICD-10-CM | POA: Insufficient documentation

## 2017-03-03 DIAGNOSIS — E785 Hyperlipidemia, unspecified: Secondary | ICD-10-CM | POA: Insufficient documentation

## 2017-03-03 DIAGNOSIS — Z90710 Acquired absence of both cervix and uterus: Secondary | ICD-10-CM | POA: Diagnosis not present

## 2017-03-03 DIAGNOSIS — Z8249 Family history of ischemic heart disease and other diseases of the circulatory system: Secondary | ICD-10-CM | POA: Diagnosis not present

## 2017-03-03 DIAGNOSIS — E1140 Type 2 diabetes mellitus with diabetic neuropathy, unspecified: Secondary | ICD-10-CM | POA: Diagnosis not present

## 2017-03-03 DIAGNOSIS — Z833 Family history of diabetes mellitus: Secondary | ICD-10-CM | POA: Insufficient documentation

## 2017-03-03 DIAGNOSIS — Z01810 Encounter for preprocedural cardiovascular examination: Secondary | ICD-10-CM | POA: Insufficient documentation

## 2017-03-03 DIAGNOSIS — Z01812 Encounter for preprocedural laboratory examination: Secondary | ICD-10-CM | POA: Diagnosis present

## 2017-03-03 DIAGNOSIS — Z7982 Long term (current) use of aspirin: Secondary | ICD-10-CM | POA: Diagnosis not present

## 2017-03-03 DIAGNOSIS — K219 Gastro-esophageal reflux disease without esophagitis: Secondary | ICD-10-CM | POA: Insufficient documentation

## 2017-03-03 DIAGNOSIS — I10 Essential (primary) hypertension: Secondary | ICD-10-CM | POA: Diagnosis not present

## 2017-03-03 DIAGNOSIS — Z9889 Other specified postprocedural states: Secondary | ICD-10-CM | POA: Insufficient documentation

## 2017-03-03 DIAGNOSIS — Z888 Allergy status to other drugs, medicaments and biological substances status: Secondary | ICD-10-CM | POA: Diagnosis not present

## 2017-03-03 HISTORY — DX: Anemia, unspecified: D64.9

## 2017-03-03 LAB — URINALYSIS, ROUTINE W REFLEX MICROSCOPIC
Bilirubin Urine: NEGATIVE
Glucose, UA: NEGATIVE mg/dL
HGB URINE DIPSTICK: NEGATIVE
Ketones, ur: NEGATIVE mg/dL
NITRITE: NEGATIVE
PH: 6 (ref 5.0–8.0)
Protein, ur: NEGATIVE mg/dL
Specific Gravity, Urine: 1.008 (ref 1.005–1.030)

## 2017-03-03 LAB — CBC
HEMATOCRIT: 39 % (ref 36.0–46.0)
HEMOGLOBIN: 12.3 g/dL (ref 12.0–15.0)
MCH: 25.5 pg — AB (ref 26.0–34.0)
MCHC: 31.5 g/dL (ref 30.0–36.0)
MCV: 80.7 fL (ref 78.0–100.0)
Platelets: 287 10*3/uL (ref 150–400)
RBC: 4.83 MIL/uL (ref 3.87–5.11)
RDW: 14.5 % (ref 11.5–15.5)
WBC: 9.6 10*3/uL (ref 4.0–10.5)

## 2017-03-03 LAB — COMPREHENSIVE METABOLIC PANEL
ALBUMIN: 3.5 g/dL (ref 3.5–5.0)
ALK PHOS: 63 U/L (ref 38–126)
ALT: 13 U/L — AB (ref 14–54)
ANION GAP: 8 (ref 5–15)
AST: 14 U/L — ABNORMAL LOW (ref 15–41)
BILIRUBIN TOTAL: 0.5 mg/dL (ref 0.3–1.2)
BUN: 15 mg/dL (ref 6–20)
CO2: 23 mmol/L (ref 22–32)
CREATININE: 1.31 mg/dL — AB (ref 0.44–1.00)
Calcium: 8.6 mg/dL — ABNORMAL LOW (ref 8.9–10.3)
Chloride: 103 mmol/L (ref 101–111)
GFR calc non Af Amer: 49 mL/min — ABNORMAL LOW (ref >60)
GFR, EST AFRICAN AMERICAN: 56 mL/min — AB (ref >60)
GLUCOSE: 219 mg/dL — AB (ref 65–99)
Potassium: 4.1 mmol/L (ref 3.5–5.1)
Sodium: 134 mmol/L — ABNORMAL LOW (ref 135–145)
TOTAL PROTEIN: 6.4 g/dL — AB (ref 6.5–8.1)

## 2017-03-03 LAB — RAPID HIV SCREEN (HIV 1/2 AB+AG)
HIV 1/2 ANTIBODIES: NONREACTIVE
HIV-1 P24 Antigen - HIV24: NONREACTIVE

## 2017-03-03 LAB — GLUCOSE, CAPILLARY: Glucose-Capillary: 208 mg/dL — ABNORMAL HIGH (ref 65–99)

## 2017-03-03 LAB — SURGICAL PCR SCREEN
MRSA, PCR: NEGATIVE
Staphylococcus aureus: NEGATIVE

## 2017-03-03 LAB — HCG, QUANTITATIVE, PREGNANCY: hCG, Beta Chain, Quant, S: 1 m[IU]/mL (ref <5)

## 2017-03-04 LAB — HEMOGLOBIN A1C
HEMOGLOBIN A1C: 7.8 % — AB (ref 4.8–5.6)
MEAN PLASMA GLUCOSE: 177 mg/dL

## 2017-03-06 NOTE — Pre-Procedure Instructions (Signed)
HgbA1C routed to PCP. 

## 2017-03-08 ENCOUNTER — Encounter (HOSPITAL_COMMUNITY)
Admission: RE | Disposition: A | Discharge status: Home / Self Care | Payer: Self-pay | Source: Ambulatory Visit | Attending: Obstetrics & Gynecology

## 2017-03-08 ENCOUNTER — Ambulatory Visit (HOSPITAL_COMMUNITY): Payer: Medicaid Other | Admitting: Anesthesiology

## 2017-03-08 ENCOUNTER — Ambulatory Visit (HOSPITAL_COMMUNITY)
Admission: RE | Admit: 2017-03-08 | Discharge: 2017-03-08 | Disposition: A | Discharge status: Home / Self Care | Payer: Medicaid Other | Source: Ambulatory Visit | Attending: Obstetrics & Gynecology | Admitting: Obstetrics & Gynecology

## 2017-03-08 DIAGNOSIS — Z79899 Other long term (current) drug therapy: Secondary | ICD-10-CM | POA: Insufficient documentation

## 2017-03-08 DIAGNOSIS — E1142 Type 2 diabetes mellitus with diabetic polyneuropathy: Secondary | ICD-10-CM | POA: Diagnosis not present

## 2017-03-08 DIAGNOSIS — I10 Essential (primary) hypertension: Secondary | ICD-10-CM | POA: Insufficient documentation

## 2017-03-08 DIAGNOSIS — K219 Gastro-esophageal reflux disease without esophagitis: Secondary | ICD-10-CM | POA: Diagnosis not present

## 2017-03-08 DIAGNOSIS — Z880 Allergy status to penicillin: Secondary | ICD-10-CM | POA: Diagnosis not present

## 2017-03-08 DIAGNOSIS — R87613 High grade squamous intraepithelial lesion on cytologic smear of cervix (HGSIL): Secondary | ICD-10-CM | POA: Diagnosis present

## 2017-03-08 DIAGNOSIS — N871 Moderate cervical dysplasia: Secondary | ICD-10-CM | POA: Diagnosis not present

## 2017-03-08 DIAGNOSIS — Z7982 Long term (current) use of aspirin: Secondary | ICD-10-CM | POA: Diagnosis not present

## 2017-03-08 DIAGNOSIS — E039 Hypothyroidism, unspecified: Secondary | ICD-10-CM | POA: Diagnosis not present

## 2017-03-08 HISTORY — PX: LASER ABLATION CONDOLAMATA: SHX5941

## 2017-03-08 LAB — GLUCOSE, CAPILLARY
GLUCOSE-CAPILLARY: 242 mg/dL — AB (ref 65–99)
GLUCOSE-CAPILLARY: 256 mg/dL — AB (ref 65–99)

## 2017-03-08 SURGERY — ABLATION, CONDYLOMA, USING LASER
Anesthesia: General

## 2017-03-08 MED ORDER — FENTANYL CITRATE (PF) 100 MCG/2ML IJ SOLN
INTRAMUSCULAR | Status: AC
  Filled 2017-03-08: qty 4

## 2017-03-08 MED ORDER — LIDOCAINE HCL 1 % IJ SOLN
INTRAMUSCULAR | Status: DC | PRN
  Administered 2017-03-08: 30 mg via INTRADERMAL

## 2017-03-08 MED ORDER — CLINDAMYCIN PHOSPHATE 900 MG/50ML IV SOLN
INTRAVENOUS | Status: AC
  Filled 2017-03-08: qty 50

## 2017-03-08 MED ORDER — LIDOCAINE HCL (PF) 1 % IJ SOLN
INTRAMUSCULAR | Status: AC
  Filled 2017-03-08: qty 5

## 2017-03-08 MED ORDER — IODINE STRONG (LUGOLS) 5 % PO SOLN
ORAL | Status: DC | PRN
  Administered 2017-03-08: 0.2 mL

## 2017-03-08 MED ORDER — LACTATED RINGERS IV SOLN
INTRAVENOUS | Status: DC
  Administered 2017-03-08: 1000 mL via INTRAVENOUS

## 2017-03-08 MED ORDER — FENTANYL CITRATE (PF) 100 MCG/2ML IJ SOLN
INTRAMUSCULAR | Status: DC | PRN
  Administered 2017-03-08 (×2): 50 ug via INTRAVENOUS

## 2017-03-08 MED ORDER — FERRIC SUBSULFATE 259 MG/GM EX SOLN
CUTANEOUS | Status: DC | PRN
  Administered 2017-03-08: 1

## 2017-03-08 MED ORDER — KETOROLAC TROMETHAMINE 10 MG PO TABS: 10.0000 mg | ORAL_TABLET | Freq: Three times a day (TID) | ORAL | 0 refills | Status: DC | PRN

## 2017-03-08 MED ORDER — MIDAZOLAM HCL 2 MG/2ML IJ SOLN
INTRAMUSCULAR | Status: AC
  Filled 2017-03-08: qty 2

## 2017-03-08 MED ORDER — ONDANSETRON HCL 4 MG/2ML IJ SOLN
INTRAMUSCULAR | Status: AC
  Filled 2017-03-08: qty 2

## 2017-03-08 MED ORDER — MIDAZOLAM HCL 5 MG/5ML IJ SOLN
INTRAMUSCULAR | Status: DC | PRN
  Administered 2017-03-08: 2 mg via INTRAVENOUS

## 2017-03-08 MED ORDER — ROCURONIUM BROMIDE 100 MG/10ML IV SOLN
INTRAVENOUS | Status: DC | PRN
  Administered 2017-03-08: 5 mg via INTRAVENOUS

## 2017-03-08 MED ORDER — ROCURONIUM BROMIDE 50 MG/5ML IV SOLN
INTRAVENOUS | Status: AC
  Filled 2017-03-08: qty 1

## 2017-03-08 MED ORDER — ONDANSETRON HCL 4 MG/2ML IJ SOLN
4.0000 mg | Freq: Once | INTRAMUSCULAR | Status: AC
  Administered 2017-03-08: 4 mg via INTRAVENOUS

## 2017-03-08 MED ORDER — KETOROLAC TROMETHAMINE 30 MG/ML IJ SOLN
30.0000 mg | Freq: Once | INTRAMUSCULAR | Status: AC
  Administered 2017-03-08: 30 mg via INTRAVENOUS
  Filled 2017-03-08: qty 1

## 2017-03-08 MED ORDER — PROPOFOL 10 MG/ML IV BOLUS
INTRAVENOUS | Status: DC | PRN
  Administered 2017-03-08: 150 mg via INTRAVENOUS

## 2017-03-08 MED ORDER — CIPROFLOXACIN IN D5W 400 MG/200ML IV SOLN
INTRAVENOUS | Status: AC
  Filled 2017-03-08: qty 200

## 2017-03-08 MED ORDER — FERRIC SUBSULFATE 259 MG/GM EX SOLN
CUTANEOUS | Status: AC
  Filled 2017-03-08: qty 8

## 2017-03-08 MED ORDER — IODINE STRONG (LUGOLS) 5 % PO SOLN
ORAL | Status: AC
  Filled 2017-03-08: qty 1

## 2017-03-08 MED ORDER — MIDAZOLAM HCL 2 MG/2ML IJ SOLN
1.0000 mg | INTRAMUSCULAR | Status: AC
  Administered 2017-03-08: 2 mg via INTRAVENOUS

## 2017-03-08 MED ORDER — SUCCINYLCHOLINE CHLORIDE 20 MG/ML IJ SOLN
INTRAMUSCULAR | Status: AC
  Filled 2017-03-08: qty 1

## 2017-03-08 MED ORDER — HYDROCODONE-ACETAMINOPHEN 5-325 MG PO TABS: 1.0000 | ORAL_TABLET | Freq: Four times a day (QID) | ORAL | 0 refills | Status: DC | PRN

## 2017-03-08 MED ORDER — SUCCINYLCHOLINE CHLORIDE 20 MG/ML IJ SOLN
INTRAMUSCULAR | Status: DC | PRN
  Administered 2017-03-08: 180 mg via INTRAVENOUS

## 2017-03-08 MED ORDER — CLINDAMYCIN PHOSPHATE 900 MG/50ML IV SOLN
900.0000 mg | INTRAVENOUS | Status: AC
  Administered 2017-03-08: 900 mg via INTRAVENOUS
  Filled 2017-03-08: qty 50

## 2017-03-08 MED ORDER — PROPOFOL 10 MG/ML IV BOLUS
INTRAVENOUS | Status: AC
  Filled 2017-03-08: qty 40

## 2017-03-08 MED ORDER — ONDANSETRON 8 MG PO TBDP: 8.0000 mg | ORAL_TABLET | Freq: Three times a day (TID) | ORAL | 0 refills | Status: DC | PRN

## 2017-03-08 MED ORDER — CIPROFLOXACIN IN D5W 400 MG/200ML IV SOLN
400.0000 mg | INTRAVENOUS | Status: AC
  Administered 2017-03-08: 400 mg via INTRAVENOUS
  Filled 2017-03-08: qty 200

## 2017-03-08 SURGICAL SUPPLY — 23 items
APPLICATOR COTTON TIP 6IN STRL (MISCELLANEOUS) ×3 IMPLANT
CLOTH BEACON ORANGE TIMEOUT ST (SAFETY) ×3 IMPLANT
COVER LIGHT HANDLE STERIS (MISCELLANEOUS) ×6 IMPLANT
ELECT REM PT RETURN 9FT ADLT (ELECTROSURGICAL) ×3
ELECTRODE REM PT RTRN 9FT ADLT (ELECTROSURGICAL) ×1 IMPLANT
GAUZE SPONGE 4X4 16PLY XRAY LF (GAUZE/BANDAGES/DRESSINGS) ×3 IMPLANT
GLOVE BIOGEL PI IND STRL 7.0 (GLOVE) ×1 IMPLANT
GLOVE BIOGEL PI IND STRL 8 (GLOVE) ×1 IMPLANT
GLOVE BIOGEL PI INDICATOR 7.0 (GLOVE) ×2
GLOVE BIOGEL PI INDICATOR 8 (GLOVE) ×2
GLOVE ECLIPSE 8.0 STRL XLNG CF (GLOVE) ×3 IMPLANT
GOWN STRL REUS W/TWL LRG LVL3 (GOWN DISPOSABLE) ×6 IMPLANT
GOWN STRL REUS W/TWL XL LVL3 (GOWN DISPOSABLE) ×3 IMPLANT
KIT ROOM TURNOVER APOR (KITS) ×3 IMPLANT
LASER FIBER DISP 1000U (UROLOGICAL SUPPLIES) ×3 IMPLANT
MANIFOLD NEPTUNE II (INSTRUMENTS) ×3 IMPLANT
NS IRRIG 1000ML POUR BTL (IV SOLUTION) ×3 IMPLANT
PACK BASIC III (CUSTOM PROCEDURE TRAY) ×2
PACK SRG BSC III STRL LF ECLPS (CUSTOM PROCEDURE TRAY) ×1 IMPLANT
PAD ARMBOARD 7.5X6 YLW CONV (MISCELLANEOUS) ×3 IMPLANT
PREFILTER SMOKE EVAC (FILTER) ×3 IMPLANT
TUBING SMOKE EVAC CO2 (TUBING) ×3 IMPLANT
WATER STERILE IRR 1000ML POUR (IV SOLUTION) ×3 IMPLANT

## 2017-03-08 NOTE — Discharge Instructions (Signed)
Cervical Laser Surgery, Care After °This sheet gives you information about how to care for yourself after your procedure. Your health care provider may also give you more specific instructions. If you have problems or questions, contact your health care provider. °What can I expect after the procedure? °After the procedure, it is common to have: °· Pain or discomfort. °· Mild cramping. °· Bleeding, spotting, or brownish discharge from your vagina. ° °Follow these instructions at home: °Activity °· Return to your normal activities as told by your health care provider. Ask your health care provider what activities are safe for you. °· Do not lift anything that is heavier than 10 lb (4.5 kg), or the limit that your health care provider tells you, until he or she says that it is safe. °· Do not have sex or put anything in your vagina until your health care provider says it is okay. °General instructions °· Take over-the-counter and prescription medicines only as told by your health care provider. °· Do not drive or use heavy machinery while taking prescription pain medicine. °· Wear sanitary pads to protect from bleeding, spotting, and discharge. °· Do not use tampons or douche until your health care provider says it is okay. °· It is up to you to get the results of your procedure. Ask your health care provider, or the department that is doing the procedure, when your results will be ready. °· Keep all follow-up visits as told by your health care provider. This is important. °Contact a health care provider if: °· Your pain or cramping does not improve. °· Your periods are more painful than usual. °· You do not get your period as expected. °Get help right away if: °· You have any symptoms of infection, such as: °? A fever. °? Chills. °? Discharge that smells bad. °· You have severe pain in your abdomen. °· You have heavy bleeding from your vagina (more than a normal period). °· You have vaginal bleeding with clumps of  blood (blood clots). °Summary °· After this procedure, it is common to have pain or discomfort and mild cramping. It is also common to have bleeding, spotting, or brownish discharge from your vagina. °· You may need to wear sanitary pads to protect from bleeding, spotting, and discharge. °· Do not have sex, use tampons, or douche until your health care provider says it is okay. °· Return to your normal activities as told by your health care provider. Ask your health care provider what activities are safe for you. °· Take over-the-counter and prescription medicines only as told by your health care provider. These include medicines for pain. °This information is not intended to replace advice given to you by your health care provider. Make sure you discuss any questions you have with your health care provider. °Document Released: 01/25/2016 Document Revised: 01/25/2016 Document Reviewed: 01/25/2016 °Elsevier Interactive Patient Education © 2018 Elsevier Inc. ° °

## 2017-03-08 NOTE — Anesthesia Postprocedure Evaluation (Signed)
Anesthesia Post Note  Patient: Diane Morgan  Procedure(s) Performed: LASER ABLATION OF THE CERVIX (N/A )  Patient location during evaluation: PACU Anesthesia Type: General Level of consciousness: awake and alert and patient cooperative Pain management: pain level controlled Vital Signs Assessment: post-procedure vital signs reviewed and stable Respiratory status: spontaneous breathing, nonlabored ventilation and respiratory function stable Cardiovascular status: blood pressure returned to baseline Postop Assessment: no apparent nausea or vomiting Anesthetic complications: no     Last Vitals:  Vitals:   03/08/17 0905 03/08/17 1007  BP: (!) 144/84 (P) 123/74  Resp: 18 (P) 16  Temp:  36.8 C  SpO2: 99% (P) 100%    Last Pain: There were no vitals filed for this visit.               Carlesha Seiple J

## 2017-03-08 NOTE — Anesthesia Preprocedure Evaluation (Signed)
Anesthesia Evaluation  Patient identified by MRN, date of birth, ID band Patient awake    Reviewed: Allergy & Precautions, NPO status , Patient's Chart, lab work & pertinent test results  Airway Mallampati: II  TM Distance: >3 FB Neck ROM: Full    Dental  (+) Poor Dentition, Missing, Chipped, Dental Advisory Given   Pulmonary neg pulmonary ROS,    breath sounds clear to auscultation       Cardiovascular hypertension, Pt. on medications  Rhythm:Regular Rate:Normal     Neuro/Psych  Neuromuscular disease    GI/Hepatic GERD  Controlled and Medicated,  Endo/Other  diabetes, Poorly Controlled, Type 2Hypothyroidism Morbid obesity  Renal/GU      Musculoskeletal   Abdominal   Peds  Hematology   Anesthesia Other Findings   Reproductive/Obstetrics                             Anesthesia Physical Anesthesia Plan  ASA: III  Anesthesia Plan: General   Post-op Pain Management:    Induction: Intravenous, Rapid sequence and Cricoid pressure planned  PONV Risk Score and Plan:   Airway Management Planned: Oral ETT  Additional Equipment:   Intra-op Plan:   Post-operative Plan: Extubation in OR  Informed Consent: I have reviewed the patients History and Physical, chart, labs and discussed the procedure including the risks, benefits and alternatives for the proposed anesthesia with the patient or authorized representative who has indicated his/her understanding and acceptance.     Plan Discussed with:   Anesthesia Plan Comments:         Anesthesia Quick Evaluation

## 2017-03-08 NOTE — Anesthesia Procedure Notes (Signed)
Procedure Name: Intubation Date/Time: 03/08/2017 9:23 AM Performed by: Charmaine Downs, CRNA Pre-anesthesia Checklist: Patient identified, Patient being monitored, Timeout performed, Emergency Drugs available and Suction available Patient Re-evaluated:Patient Re-evaluated prior to induction Oxygen Delivery Method: Circle System Utilized Preoxygenation: Pre-oxygenation with 100% oxygen Induction Type: IV induction, Rapid sequence and Cricoid Pressure applied Ventilation: Mask ventilation without difficulty Laryngoscope Size: Mac and 3 Grade View: Grade II Tube type: Oral Tube size: 7.0 mm Number of attempts: 1 Airway Equipment and Method: stylet Placement Confirmation: ETT inserted through vocal cords under direct vision,  positive ETCO2 and breath sounds checked- equal and bilateral Secured at: 22 cm Tube secured with: Tape Dental Injury: Teeth and Oropharynx as per pre-operative assessment  Difficulty Due To: Difficulty was anticipated, Difficult Airway- due to limited oral opening and Difficult Airway- due to dentition

## 2017-03-08 NOTE — Op Note (Signed)
Preoperative Diagnosis:  High Grade Squamous Intraepithelial lesion, adequate colposcopy, very large expansive lesion  Postoperative Diagnosis:  Same as above  Procedure:  Laser ablation of the cervix  Surgeon:  Lazaro ArmsLuther H Meldrick Buttery MD  Anaesthesia:  Laryngeal Mask Airway  Findings:  Patient had an abnormal pap smear which was evaluated in the office with colposcopy and directed biopsies.  Pathology report returned as high Grade SIL.  The colposcopy was adequate.  The lesion is very large and expansive.  As a result, the patient is admitted for laser ablation of the cervix.  Description of Note:  Patient was taken to the OR and placed in the supine position where she underwent laryngeal mask airway anaesthesia.  She was placed in the dorsal lithotomy position.  She was draped for laser.  Graves speculum was placed and 3% acetic acid used and the laser microscope employed to perform colposcopy which confirmed the office findings.  Laser was used on typical cervical settings and used to vaporized the squamocolumnar junction to  depth of  5-7 mm peripherally and 7-9 mm centrally.  Surgical margin of several mm was employed beyond the acetowhite epithelium.  Hemostasis was achieved with the laser and Monsel's solution.  Patient was awakened from anaesthesia in good stable condition and all counts were correct.  She received cleocin and cipro and Toradol 30 mg IV preoperatively prophylactically.  EBL 0 cc  Lazaro ArmsLuther H Zylen Wenig, MD 03/08/2017 9:53 AM

## 2017-03-08 NOTE — Transfer of Care (Signed)
Immediate Anesthesia Transfer of Care Note  Patient: Diane LoserRhonda Huesman  Procedure(s) Performed: LASER ABLATION OF THE CERVIX (N/A )  Patient Location: PACU  Anesthesia Type:General  Level of Consciousness: awake and patient cooperative  Airway & Oxygen Therapy: Patient Spontanous Breathing and Patient connected to face mask oxygen  Post-op Assessment: Report given to RN, Post -op Vital signs reviewed and stable and Patient moving all extremities  Post vital signs: Reviewed and stable  Last Vitals:  Vitals:   03/08/17 0900 03/08/17 0905  BP: 139/85 (!) 144/84  Resp: 18 18  Temp:    SpO2: 98% 99%    Last Pain: There were no vitals filed for this visit.       Complications: No apparent anesthesia complications

## 2017-03-08 NOTE — Interval H&P Note (Signed)
History and Physical Interval Note:  03/08/2017 8:54 AM  Diane Morgan  has presented today for surgery, with the diagnosis of Cervical Dysplasia  The various methods of treatment have been discussed with the patient and family. After consideration of risks, benefits and other options for treatment, the patient has consented to  Procedure(s): LASER ABLATION CERVIX (N/A) as a surgical intervention .  The patient's history has been reviewed, patient examined, no change in status, stable for surgery.  I have reviewed the patient's chart and labs.  Questions were answered to the patient's satisfaction.     Lazaro ArmsLuther H Zadyn Yardley

## 2017-03-08 NOTE — H&P (Signed)
Preoperative History and Physical  Diane Morgan is a 46 y.o. G0P0000 with Patient's last menstrual period was 01/04/2017. admitted for a laser ablation of the cervix for HSIL, extensive large lesion.  Not even a candidate for conization would essentially be a cervical amputation  PMH:        Past Medical History:  Diagnosis Date  . Cellulitis and abscess of trunk 11/28/2012  . Diabetic peripheral neuropathy ([place])   . DM (diabetes mellitus) ([place])   . Eczema   . GERD (gastroesophageal reflux disease)   . Hyperlipidemia   . Hypertension   . Leg pain, bilateral 10/08/09  . NIDDM (non-insulin dependent diabetes mellitus)   . URI (upper respiratory infection) 07/01/10    PSH:          Past Surgical History:  Procedure Laterality Date  . CHOLECYSTECTOMY    . WISDOM TOOTH EXTRACTION      POb/GynH:              OB History    Gravida Para Term Preterm AB Living   0 0 0 0 0 0   SAB TAB Ectopic Multiple Live Births   0 0 0 0 0      SH:   Social History       Tobacco Use  . Smoking status: Never Smoker  . Smokeless tobacco: Never Used  Substance Use Topics  . Alcohol use: No  . Drug use: No    FH:         Family History  Problem Relation Age of Onset  . Diabetes Father   . Hypertension Mother   . Hyperlipidemia Mother   . Heart disease Maternal Grandmother   . Diabetes Paternal Grandmother   . Diabetes Paternal Grandfather      Allergies:       Allergies  Allergen Reactions  . Penicillins Anaphylaxis and Hives    Childhood Reaction.  . Niaspan [Niacin] Other (See Comments)    Patient passes out.    Medications:       Current Outpatient Medications:  .  aspirin 81 MG chewable tablet, Chew 81 mg by mouth daily.  , Disp: , Rfl:  .  atorvastatin (LIPITOR) 40 MG tablet, TAKE ONE TABLET BY MOUTH ONCE DAILY, Disp: 90 tablet, Rfl: 0 .  Blood Glucose Monitoring Suppl (ACCU-CHEK AVIVA PLUS) w/Device KIT, 1 Device by  Does not apply route as needed., Disp: 1 kit, Rfl: 0 .  BYDUREON 2 MG PEN, INJECT 1 DOSE (2 MG) SUBCUTANEOUSLY ONCE A WEEK, Disp: 4 each, Rfl: 1 .  cetirizine (ZYRTEC) 10 MG tablet, Take 10 mg by mouth daily., Disp: , Rfl:  .  fenofibrate (TRICOR) 145 MG tablet, Take 1 tablet (145 mg total) by mouth daily., Disp: 90 tablet, Rfl: 3 .  ferrous sulfate 325 (65 FE) MG tablet, Take 1 tablet (325 mg total) by mouth 2 (two) times daily with a meal. (Patient taking differently: Take 325 mg by mouth daily. ), Disp: 60 tablet, Rfl: 3 .  gabapentin (NEURONTIN) 300 MG capsule, TAKE 1 CAPSULE BY MOUTH THREE TIMES DAILY, Disp: 270 capsule, Rfl: 0 .  glucose blood (ACCU-CHEK AVIVA) test strip, Use as instructed, Disp: 100 each, Rfl: 12 .  Lancet Devices (ACCU-CHEK SOFTCLIX) lancets, Use as instructed, Disp: 1 each, Rfl: 0 .  levothyroxine (SYNTHROID, LEVOTHROID) 50 MCG tablet, TAKE ONE TABLET BY MOUTH ONCE DAILY, Disp: 90 tablet, Rfl: 3 .  mupirocin ointment (BACTROBAN) 2 %, Place 1 application into the  nose 2 (two) times daily., Disp: 22 g, Rfl: 0 .  omeprazole (PRILOSEC) 40 MG capsule, Take 1 capsule (40 mg total) by mouth daily., Disp: 90 capsule, Rfl: 3  Review of Systems:   Review of Systems  Constitutional: Negative for fever, chills, weight loss, malaise/fatigue and diaphoresis.  HENT: Negative for hearing loss, ear pain, nosebleeds, congestion, sore throat, neck pain, tinnitus and ear discharge.   Eyes: Negative for blurred vision, double vision, photophobia, pain, discharge and redness.  Respiratory: Negative for cough, hemoptysis, sputum production, shortness of breath, wheezing and stridor.   Cardiovascular: Negative for chest pain, palpitations, orthopnea, claudication, leg swelling and PND.  Gastrointestinal: Positive for abdominal pain. Negative for heartburn, nausea, vomiting, diarrhea, constipation, blood in stool and melena.  Genitourinary: Negative for dysuria, urgency, frequency, hematuria  and flank pain.  Musculoskeletal: Negative for myalgias, back pain, joint pain and falls.  Skin: Negative for itching and rash.  Neurological: Negative for dizziness, tingling, tremors, sensory change, speech change, focal weakness, seizures, loss of consciousness, weakness and headaches.  Endo/Heme/Allergies: Negative for environmental allergies and polydipsia. Does not bruise/bleed easily.  Psychiatric/Behavioral: Negative for depression, suicidal ideas, hallucinations, memory loss and substance abuse. The patient is not nervous/anxious and does not have insomnia.      PHYSICAL EXAM:  Blood pressure 128/86, pulse 82, height 5' 5"  (1.651 m), weight 213 lb (96.6 kg), last menstrual period 01/04/2017.    Vitals reviewed. Constitutional: She is oriented to person, place, and time. She appears well-developed and well-nourished.  HENT:  Head: Normocephalic and atraumatic.  Right Ear: External ear normal.  Left Ear: External ear normal.  Nose: Nose normal.  Mouth/Throat: Oropharynx is clear and moist.  Eyes: Conjunctivae and EOM are normal. Pupils are equal, round, and reactive to light. Right eye exhibits no discharge. Left eye exhibits no discharge. No scleral icterus.  Neck: Normal range of motion. Neck supple. No tracheal deviation present. No thyromegaly present.  Cardiovascular: Normal rate, regular rhythm, normal heart sounds and intact distal pulses.  Exam reveals no gallop and no friction rub.   No murmur heard. Respiratory: Effort normal and breath sounds normal. No respiratory distress. She has no wheezes. She has no rales. She exhibits no tenderness.  GI: Soft. Bowel sounds are normal. She exhibits no distension and no mass. There is tenderness. There is no rebound and no guarding.  Genitourinary:       Vulva is normal without lesions Vagina is pink moist without discharge Cervix see colpo note Uterus is normal size, contour, position, consistency, mobility,  non-tender Adnexa is negative with normal sized ovaries by sonogram  Musculoskeletal: Normal range of motion. She exhibits no edema and no tenderness.  Neurological: She is alert and oriented to person, place, and time. She has normal reflexes. She displays normal reflexes. No cranial nerve deficit. She exhibits normal muscle tone. Coordination normal.  Skin: Skin is warm and dry. No rash noted. No erythema. No pallor.  Psychiatric: She has a normal mood and affect. Her behavior is normal. Judgment and thought content normal.    Labs: Results for orders placed or performed during the hospital encounter of 03/08/17 (from the past 168 hour(s))  Glucose, capillary   Collection Time: 03/08/17  7:20 AM  Result Value Ref Range   Glucose-Capillary 242 (H) 65 - 99 mg/dL  Results for orders placed or performed during the hospital encounter of 03/03/17 (from the past 168 hour(s))  Surgical pcr screen   Collection Time: 03/03/17 11:03 AM  Result Value Ref Range   MRSA, PCR NEGATIVE NEGATIVE   Staphylococcus aureus NEGATIVE NEGATIVE  Hemoglobin A1c   Collection Time: 03/03/17 11:03 AM  Result Value Ref Range   Hgb A1c MFr Bld 7.8 (H) 4.8 - 5.6 %   Mean Plasma Glucose 177 mg/dL  CBC   Collection Time: 03/03/17 11:03 AM  Result Value Ref Range   WBC 9.6 4.0 - 10.5 K/uL   RBC 4.83 3.87 - 5.11 MIL/uL   Hemoglobin 12.3 12.0 - 15.0 g/dL   HCT 39.0 36.0 - 46.0 %   MCV 80.7 78.0 - 100.0 fL   MCH 25.5 (L) 26.0 - 34.0 pg   MCHC 31.5 30.0 - 36.0 g/dL   RDW 14.5 11.5 - 15.5 %   Platelets 287 150 - 400 K/uL  Comprehensive metabolic panel   Collection Time: 03/03/17 11:03 AM  Result Value Ref Range   Sodium 134 (L) 135 - 145 mmol/L   Potassium 4.1 3.5 - 5.1 mmol/L   Chloride 103 101 - 111 mmol/L   CO2 23 22 - 32 mmol/L   Glucose, Bld 219 (H) 65 - 99 mg/dL   BUN 15 6 - 20 mg/dL   Creatinine, Ser 1.31 (H) 0.44 - 1.00 mg/dL   Calcium 8.6 (L) 8.9 - 10.3 mg/dL   Total Protein 6.4 (L) 6.5 - 8.1  g/dL   Albumin 3.5 3.5 - 5.0 g/dL   AST 14 (L) 15 - 41 U/L   ALT 13 (L) 14 - 54 U/L   Alkaline Phosphatase 63 38 - 126 U/L   Total Bilirubin 0.5 0.3 - 1.2 mg/dL   GFR calc non Af Amer 49 (L) >60 mL/min   GFR calc Af Amer 56 (L) >60 mL/min   Anion gap 8 5 - 15  hCG, quantitative, pregnancy   Collection Time: 03/03/17 11:03 AM  Result Value Ref Range   hCG, Beta Chain, Quant, S 1 <5 mIU/mL  Rapid HIV screen (HIV 1/2 Ab+Ag)   Collection Time: 03/03/17 11:03 AM  Result Value Ref Range   HIV-1 P24 Antigen - HIV24 NON REACTIVE NON REACTIVE   HIV 1/2 Antibodies NON REACTIVE NON REACTIVE   Interpretation (HIV Ag Ab)      A non reactive test result means that HIV 1 or HIV 2 antibodies and HIV 1 p24 antigen were not detected in the specimen.  Urinalysis, Routine w reflex microscopic   Collection Time: 03/03/17 11:03 AM  Result Value Ref Range   Color, Urine YELLOW YELLOW   APPearance CLEAR CLEAR   Specific Gravity, Urine 1.008 1.005 - 1.030   pH 6.0 5.0 - 8.0   Glucose, UA NEGATIVE NEGATIVE mg/dL   Hgb urine dipstick NEGATIVE NEGATIVE   Bilirubin Urine NEGATIVE NEGATIVE   Ketones, ur NEGATIVE NEGATIVE mg/dL   Protein, ur NEGATIVE NEGATIVE mg/dL   Nitrite NEGATIVE NEGATIVE   Leukocytes, UA TRACE (A) NEGATIVE   RBC / HPF 0-5 0 - 5 RBC/hpf   WBC, UA 0-5 0 - 5 WBC/hpf   Bacteria, UA RARE (A) NONE SEEN   Squamous Epithelial / LPF 0-5 (A) NONE SEEN  Glucose, capillary   Collection Time: 03/03/17 11:04 AM  Result Value Ref Range   Glucose-Capillary 208 (H) 65 - 99 mg/dL    EKG:    Orders placed or performed during the hospital encounter of 11/28/12  . EKG test  . EKG test  . EKG   Imaging Studies: ImagingResults  No results found.  Assessment: HSIL of cervix, very large extensive lesion       Patient Active Problem List   Diagnosis Date Noted  . Metabolic acidosis 97/94/8016  . Microcytosis 03/07/2016  . Hypertriglyceridemia 12/08/2015  . Carpal tunnel  syndrome on left 06/02/2015  . DUB (dysfunctional uterine bleeding) 03/27/2015  . Subclinical hypothyroidism 12/26/2014  . Cellulitis and abscess 11/28/2012  . GERD (gastroesophageal reflux disease) 11/28/2012  . Hypertension 08/17/2012  . Hyperlipidemia 08/17/2012  . Morbid obesity ([place]) 08/17/2012  . T2DM (type 2 diabetes mellitus) ([place]) 08/17/2012    Plan: Laser ablation of the cervix 03/09/2015 Discussed the results and findings as well as the management of laser surgery  Florian Buff 02/13/2017 4:55 PM

## 2017-03-10 ENCOUNTER — Encounter (HOSPITAL_COMMUNITY): Payer: Self-pay | Admitting: Obstetrics & Gynecology

## 2017-03-17 ENCOUNTER — Other Ambulatory Visit: Payer: Self-pay

## 2017-03-17 LAB — HM DIABETES EYE EXAM

## 2017-03-22 ENCOUNTER — Encounter: Payer: Medicaid Other | Admitting: Obstetrics and Gynecology

## 2017-03-23 ENCOUNTER — Ambulatory Visit (INDEPENDENT_AMBULATORY_CARE_PROVIDER_SITE_OTHER): Payer: Medicaid Other | Admitting: Obstetrics & Gynecology

## 2017-03-23 ENCOUNTER — Encounter: Payer: Self-pay | Admitting: Obstetrics & Gynecology

## 2017-03-23 ENCOUNTER — Ambulatory Visit: Payer: Medicaid Other | Admitting: Family Medicine

## 2017-03-23 ENCOUNTER — Encounter: Payer: Self-pay | Admitting: Family Medicine

## 2017-03-23 VITALS — BP 121/75 | HR 91 | Temp 96.9°F | Ht 61.0 in | Wt 216.6 lb

## 2017-03-23 VITALS — BP 132/90 | HR 83 | Ht 60.0 in | Wt 216.5 lb

## 2017-03-23 DIAGNOSIS — I10 Essential (primary) hypertension: Secondary | ICD-10-CM | POA: Diagnosis not present

## 2017-03-23 DIAGNOSIS — H269 Unspecified cataract: Secondary | ICD-10-CM

## 2017-03-23 DIAGNOSIS — E1142 Type 2 diabetes mellitus with diabetic polyneuropathy: Secondary | ICD-10-CM

## 2017-03-23 DIAGNOSIS — Z9889 Other specified postprocedural states: Secondary | ICD-10-CM

## 2017-03-23 DIAGNOSIS — E1140 Type 2 diabetes mellitus with diabetic neuropathy, unspecified: Secondary | ICD-10-CM | POA: Diagnosis not present

## 2017-03-23 DIAGNOSIS — D509 Iron deficiency anemia, unspecified: Secondary | ICD-10-CM

## 2017-03-23 LAB — CBC WITH DIFFERENTIAL/PLATELET
Basophils Absolute: 0 10*3/uL (ref 0.0–0.2)
Basos: 1 %
EOS (ABSOLUTE): 0.2 10*3/uL (ref 0.0–0.4)
EOS: 2 %
HEMATOCRIT: 39.2 % (ref 34.0–46.6)
Hemoglobin: 12.3 g/dL (ref 11.1–15.9)
IMMATURE GRANS (ABS): 0 10*3/uL (ref 0.0–0.1)
Immature Granulocytes: 0 %
LYMPHS: 28 %
Lymphocytes Absolute: 2.5 10*3/uL (ref 0.7–3.1)
MCH: 25.4 pg — ABNORMAL LOW (ref 26.6–33.0)
MCHC: 31.4 g/dL — ABNORMAL LOW (ref 31.5–35.7)
MCV: 81 fL (ref 79–97)
Monocytes Absolute: 0.5 10*3/uL (ref 0.1–0.9)
Monocytes: 5 %
NEUTROS PCT: 64 %
Neutrophils Absolute: 5.6 10*3/uL (ref 1.4–7.0)
Platelets: 304 10*3/uL (ref 150–379)
RBC: 4.84 x10E6/uL (ref 3.77–5.28)
RDW: 15.1 % (ref 12.3–15.4)
WBC: 8.8 10*3/uL (ref 3.4–10.8)

## 2017-03-23 LAB — CMP14+EGFR
ALT: 9 IU/L (ref 0–32)
AST: 12 IU/L (ref 0–40)
Albumin/Globulin Ratio: 1.3 (ref 1.2–2.2)
Albumin: 3.5 g/dL (ref 3.5–5.5)
Alkaline Phosphatase: 74 IU/L (ref 39–117)
BUN/Creatinine Ratio: 10 (ref 9–23)
BUN: 13 mg/dL (ref 6–24)
Bilirubin Total: 0.2 mg/dL (ref 0.0–1.2)
CALCIUM: 8.9 mg/dL (ref 8.7–10.2)
CO2: 21 mmol/L (ref 20–29)
CREATININE: 1.28 mg/dL — AB (ref 0.57–1.00)
Chloride: 103 mmol/L (ref 96–106)
GFR calc Af Amer: 59 mL/min/{1.73_m2} — ABNORMAL LOW (ref >59)
GFR calc non Af Amer: 51 mL/min/{1.73_m2} — ABNORMAL LOW (ref >59)
GLOBULIN, TOTAL: 2.6 g/dL (ref 1.5–4.5)
Glucose: 246 mg/dL — ABNORMAL HIGH (ref 65–99)
Potassium: 4.2 mmol/L (ref 3.5–5.2)
Sodium: 137 mmol/L (ref 134–144)
Total Protein: 6.1 g/dL (ref 6.0–8.5)

## 2017-03-23 LAB — LIPID PANEL
CHOL/HDL RATIO: 3.7 ratio (ref 0.0–4.4)
Cholesterol, Total: 108 mg/dL (ref 100–199)
HDL: 29 mg/dL — ABNORMAL LOW (ref >39)
LDL Calculated: 42 mg/dL (ref 0–99)
Triglycerides: 187 mg/dL — ABNORMAL HIGH (ref 0–149)
VLDL CHOLESTEROL CAL: 37 mg/dL (ref 5–40)

## 2017-03-23 LAB — BAYER DCA HB A1C WAIVED: HB A1C: 8 % — AB (ref <7.0)

## 2017-03-23 MED ORDER — ATORVASTATIN CALCIUM 40 MG PO TABS: ORAL_TABLET | ORAL | 3 refills | Status: DC

## 2017-03-23 MED ORDER — EXENATIDE ER 2 MG/0.85ML ~~LOC~~ AUIJ: 2.0000 mg | AUTO-INJECTOR | SUBCUTANEOUS | 11 refills | Status: DC

## 2017-03-23 NOTE — Patient Instructions (Signed)
Great to see you!   

## 2017-03-23 NOTE — Progress Notes (Signed)
  HPI: Patient returns for routine postoperative follow-up having undergone laser ablation of the cervix for high-grade dysplasia on 03/08/2017.  The patient's immediate postoperative recovery has been unremarkable. Since hospital discharge the patient reports bleeding for 1 week followed by watery discharge that continues No unpleasant odor is noted.   Current Outpatient Medications: acetaminophen (TYLENOL) 500 MG tablet, Take 1,000 mg by mouth every 6 (six) hours as needed for moderate pain or headache., Disp: , Rfl:  aspirin 81 MG chewable tablet, Chew 81 mg by mouth daily.  , Disp: , Rfl:  atorvastatin (LIPITOR) 40 MG tablet, TAKE 40 MG BY MOUTH ONCE DAILY, Disp: 90 tablet, Rfl: 3 Blood Glucose Monitoring Suppl (ACCU-CHEK AVIVA PLUS) w/Device KIT, 1 Device by Does not apply route as needed., Disp: 1 kit, Rfl: 0 cetirizine (ZYRTEC) 10 MG tablet, Take 10 mg by mouth at bedtime. , Disp: , Rfl:  Exenatide ER (BYDUREON BCISE) 2 MG/0.85ML AUIJ, Inject 2 mg into the skin once a week., Disp: 4 pen, Rfl: 11 fenofibrate (TRICOR) 145 MG tablet, Take 1 tablet (145 mg total) by mouth daily., Disp: 90 tablet, Rfl: 3 ferrous sulfate 325 (65 FE) MG tablet, Take 1 tablet (325 mg total) by mouth 2 (two) times daily with a meal., Disp: 60 tablet, Rfl: 3 gabapentin (NEURONTIN) 300 MG capsule, TAKE 1 CAPSULE BY MOUTH THREE TIMES DAILY (Patient taking differently: TAKE 300 MG BY MOUTH THREE TIMES DAILY), Disp: 270 capsule, Rfl: 0 glucose blood (ACCU-CHEK AVIVA) test strip, Use as instructed, Disp: 100 each, Rfl: 12 HYDROcodone-acetaminophen (NORCO/VICODIN) 5-325 MG tablet, Take 1 tablet by mouth every 6 (six) hours as needed., Disp: 10 tablet, Rfl: 0 ketorolac (TORADOL) 10 MG tablet, Take 1 tablet (10 mg total) by mouth every 8 (eight) hours as needed., Disp: 15 tablet, Rfl: 0 Lancet Devices (ACCU-CHEK SOFTCLIX) lancets, Use as instructed, Disp: 1 each, Rfl: 0 levothyroxine (SYNTHROID, LEVOTHROID) 50 MCG tablet,  TAKE ONE TABLET BY MOUTH ONCE DAILY (Patient taking differently: TAKE 50 MCG BY MOUTH ONCE DAILY), Disp: 90 tablet, Rfl: 3 mupirocin ointment (BACTROBAN) 2 %, Place 1 application into the nose 2 (two) times daily., Disp: 22 g, Rfl: 0 omeprazole (PRILOSEC) 40 MG capsule, Take 1 capsule (40 mg total) by mouth daily., Disp: 90 capsule, Rfl: 3 ondansetron (ZOFRAN ODT) 8 MG disintegrating tablet, Take 1 tablet (8 mg total) by mouth every 8 (eight) hours as needed for nausea or vomiting., Disp: 20 tablet, Rfl: 0  No current facility-administered medications for this visit.     Blood pressure 132/90, pulse 83, height 5' (1.524 m), weight 216 lb 8 oz (98.2 kg).  Physical Exam: The cervix has the appearance of a post laser surgery with an eschar and a watery debris  Diagnostic Tests:   Pathology: Not applicable\cervical biopsy revealed high-grade dysplasia and colposcopy revealed a very large lesion  Impression: Status post laser ablation of the cervix for the treatment of high-grade squamous intraepithelial lesion of the cervix  Plan: Patient will require a Pap smear every 6 months for the next 2 years and then Pap smears yearly I informed the patient and her mother even if she has a number of normal Pap smears in a row she always will need Pap smears on a yearly basis throughout her life  Follow up: 6  months  Florian Buff, MD

## 2017-03-23 NOTE — Progress Notes (Signed)
   HPI  Patient presents today here for follow-up chronic medical conditions.   She states that she is recovering well from her recent surgery for HSIL.   She reports good medication compliance, however states that bydureon is being discontinued and needs to e changed.  Avg fasting CBG is 180-200 Watching diet but admits to indiscretion over the holidays.   Was recently told she had cataracts by her optometrist, needs referral to ophthalmology  HTN  No chest pain or headaches. Good medication compliance and tolerance.  PMH: Smoking status noted ROS: Per HPI  Objective: BP 121/75   Pulse 91   Temp (!) 96.9 F (36.1 C) (Oral)   Ht '5\' 1"'$  (1.549 m)   Wt 216 lb 9.6 oz (98.2 kg)   BMI 40.93 kg/m  Gen: NAD, alert, cooperative with exam HEENT: NCAT, EOMI, PERRL CV: RRR, good S1/S2, no murmur Resp: CTABL, no wheezes, non-labored Ext: No edema, warm Neuro: Alert and oriented, No gross deficits  Diabetic Foot Exam - Simple   Simple Foot Form Diabetic Foot exam was performed with the following findings:  Yes 03/23/2017 10:07 AM  Visual Inspection See comments:  Yes Sensation Testing Intact to touch and monofilament testing bilaterally:  Yes Pulse Check Posterior Tibialis and Dorsalis pulse intact bilaterally:  Yes Comments Bilateral forefoot medial bunion at the base of the great toe with small callus, no signs of ulceration     Assessment and plan:  #Type 2 diabetes A1c 8.0, control slipping. Continue current medications, changing bydureon to [employer] Discussed diet   #Hypertension Well-controlled with lifestyle changes only  #Morbid obesity Stable over the last month, continue therapeutic lifestyle changes  #Cataracts Refer to ophthalmology,   Anemia Repeat labs  Hyperlipidemia Repeat labs, continue fenofibrate plus statin  Orders Placed This Encounter  Procedures  . Bayer DCA Hb A1c Waived  . CMP14+EGFR  . CBC with Differential/Platelet  .  Lipid panel  . Ambulatory referral to Ophthalmology    Referral Priority:   Routine    Referral Type:   Consultation    Referral Reason:   Specialty Services Required    Requested Specialty:   Ophthalmology    Number of Visits Requested:   1    Meds ordered this encounter  Medications  . atorvastatin (LIPITOR) 40 MG tablet    Sig: TAKE 40 MG BY MOUTH ONCE DAILY    Dispense:  90 tablet    Refill:  3  . Exenatide ER (BYDUREON BCISE) 2 MG/0.85ML AUIJ    Sig: Inject 2 mg into the skin once a week.    Dispense:  4 pen    Refill:  [place], MD [facility] 03/23/2017, 10:04 AM

## 2017-03-28 ENCOUNTER — Other Ambulatory Visit: Payer: Self-pay

## 2017-03-28 MED ORDER — EXENATIDE ER 2 MG ~~LOC~~ PEN: PEN_INJECTOR | SUBCUTANEOUS | 11 refills | Status: DC

## 2017-03-28 NOTE — Telephone Encounter (Signed)
Please make sure the Bydureon is ordered correctly?  Thanks

## 2017-04-25 ENCOUNTER — Other Ambulatory Visit: Payer: Self-pay | Admitting: Family Medicine

## 2017-04-25 DIAGNOSIS — E1142 Type 2 diabetes mellitus with diabetic polyneuropathy: Secondary | ICD-10-CM

## 2017-04-25 DIAGNOSIS — E1140 Type 2 diabetes mellitus with diabetic neuropathy, unspecified: Secondary | ICD-10-CM

## 2017-04-25 DIAGNOSIS — I10 Essential (primary) hypertension: Secondary | ICD-10-CM

## 2017-04-25 DIAGNOSIS — D509 Iron deficiency anemia, unspecified: Secondary | ICD-10-CM

## 2017-05-22 DIAGNOSIS — H5213 Myopia, bilateral: Secondary | ICD-10-CM | POA: Diagnosis not present

## 2017-05-22 DIAGNOSIS — H25041 Posterior subcapsular polar age-related cataract, right eye: Secondary | ICD-10-CM | POA: Diagnosis not present

## 2017-05-25 ENCOUNTER — Other Ambulatory Visit: Payer: Self-pay | Admitting: Family Medicine

## 2017-05-25 DIAGNOSIS — K08439 Partial loss of teeth due to caries, unspecified class: Secondary | ICD-10-CM | POA: Diagnosis not present

## 2017-05-25 NOTE — Telephone Encounter (Signed)
Please advise 

## 2017-05-25 NOTE — Telephone Encounter (Signed)
Needs to be seen

## 2017-05-25 NOTE — Telephone Encounter (Signed)
Informed will need to be seen, offered to look for appt for tomorrow. Pt was on her way to an Urgent Care

## 2017-06-01 NOTE — Patient Instructions (Signed)
Your procedure is scheduled on: 06/12/2017   Report to [facility] at  640   AM.  Call this number if you have problems the morning of surgery: [phone]   Do not eat food or drink liquids :After Midnight.      Take these medicines the morning of surgery with A SIP OF WATER: neurontin, hydrocodone or toradol, levothyroxine, prilosec, zofran.   Do not wear jewelry, make-up or nail polish.  Do not wear lotions, powders, or perfumes. You may wear deodorant.  Do not shave 48 hours prior to surgery.  Do not bring valuables to the hospital.  Contacts, dentures or bridgework may not be worn into surgery.  Leave suitcase in the car. After surgery it may be brought to your room.  For patients admitted to the hospital, checkout time is 11:00 AM the day of discharge.   Patients discharged the day of surgery will not be allowed to drive home.  :     Please read over the following fact sheets that you were given: Coughing and Deep Breathing, Surgical Site Infection Prevention, Anesthesia Post-op Instructions and Care and Recovery After Surgery    Cataract A cataract is a clouding of the lens of the eye. When a lens becomes cloudy, vision is reduced based on the degree and nature of the clouding. Many cataracts reduce vision to some degree. Some cataracts make people more near-sighted as they develop. Other cataracts increase glare. Cataracts that are ignored and become worse can sometimes look white. The white color can be seen through the pupil. CAUSES   Aging. However, cataracts may occur at any age, even in newborns.   Certain drugs.   Trauma to the eye.   Certain diseases such as diabetes.   Specific eye diseases such as chronic inflammation inside the eye or a sudden attack of a rare form of glaucoma.   Inherited or acquired medical problems.  SYMPTOMS   Gradual, progressive drop in vision in the affected eye.   Severe, rapid visual loss. This most often happens when trauma is the  cause.  DIAGNOSIS  To detect a cataract, an eye doctor examines the lens. Cataracts are best diagnosed with an exam of the eyes with the pupils enlarged (dilated) by drops.  TREATMENT  For an early cataract, vision may improve by using different eyeglasses or stronger lighting. If that does not help your vision, surgery is the only effective treatment. A cataract needs to be surgically removed when vision loss interferes with your everyday activities, such as driving, reading, or watching TV. A cataract may also have to be removed if it prevents examination or treatment of another eye problem. Surgery removes the cloudy lens and usually replaces it with a substitute lens (intraocular lens, IOL).  At a time when both you and your doctor agree, the cataract will be surgically removed. If you have cataracts in both eyes, only one is usually removed at a time. This allows the operated eye to heal and be out of danger from any possible problems after surgery (such as infection or poor wound healing). In rare cases, a cataract may be doing damage to your eye. In these cases, your caregiver may advise surgical removal right away. The vast majority of people who have cataract surgery have better vision afterward. HOME CARE INSTRUCTIONS  If you are not planning surgery, you may be asked to do the following:  Use different eyeglasses.   Use stronger or brighter lighting.   Ask your  eye doctor about reducing your medicine dose or changing medicines if it is thought that a medicine caused your cataract. Changing medicines does not make the cataract go away on its own.   Become familiar with your surroundings. Poor vision can lead to injury. Avoid bumping into things on the affected side. You are at a higher risk for tripping or falling.   Exercise extreme care when driving or operating machinery.   Wear sunglasses if you are sensitive to bright light or experiencing problems with glare.  SEEK IMMEDIATE  MEDICAL CARE IF:   You have a worsening or sudden vision loss.   You notice redness, swelling, or increasing pain in the eye.   You have a fever.  Document Released: 03/07/2005 Document Revised: 02/24/2011 Document Reviewed: 10/29/2010 [facility] Patient Information 2012 [place].PATIENT INSTRUCTIONS POST-ANESTHESIA  IMMEDIATELY FOLLOWING SURGERY:  Do not drive or operate machinery for the first twenty four hours after surgery.  Do not make any important decisions for twenty four hours after surgery or while taking narcotic pain medications or sedatives.  If you develop intractable nausea and vomiting or a severe headache please notify your doctor immediately.  FOLLOW-UP:  Please make an appointment with your surgeon as instructed. You do not need to follow up with anesthesia unless specifically instructed to do so.  WOUND CARE INSTRUCTIONS (if applicable):  Keep a dry clean dressing on the anesthesia/puncture wound site if there is drainage.  Once the wound has quit draining you may leave it open to air.  Generally you should leave the bandage intact for twenty four hours unless there is drainage.  If the epidural site drains for more than 36-48 hours please call the anesthesia department.  QUESTIONS?:  Please feel free to call your physician or the hospital operator if you have any questions, and they will be happy to assist you.

## 2017-06-06 ENCOUNTER — Encounter (HOSPITAL_COMMUNITY): Payer: Self-pay

## 2017-06-06 ENCOUNTER — Encounter (HOSPITAL_COMMUNITY)
Admission: RE | Admit: 2017-06-06 | Discharge: 2017-06-06 | Disposition: A | Discharge status: Home / Self Care | Payer: Medicaid Other | Source: Ambulatory Visit | Attending: Ophthalmology | Admitting: Ophthalmology

## 2017-06-06 ENCOUNTER — Other Ambulatory Visit: Payer: Self-pay

## 2017-06-06 DIAGNOSIS — Z01812 Encounter for preprocedural laboratory examination: Secondary | ICD-10-CM | POA: Diagnosis not present

## 2017-06-06 HISTORY — DX: Unspecified osteoarthritis, unspecified site: M19.90

## 2017-06-06 LAB — HCG, SERUM, QUALITATIVE: Preg, Serum: NEGATIVE

## 2017-06-06 LAB — CBC
HEMATOCRIT: 38.7 % (ref 36.0–46.0)
Hemoglobin: 12.6 g/dL (ref 12.0–15.0)
MCH: 25.3 pg — AB (ref 26.0–34.0)
MCHC: 32.6 g/dL (ref 30.0–36.0)
MCV: 77.7 fL — AB (ref 78.0–100.0)
PLATELETS: 287 10*3/uL (ref 150–400)
RBC: 4.98 MIL/uL (ref 3.87–5.11)
RDW: 15 % (ref 11.5–15.5)
WBC: 9.7 10*3/uL (ref 4.0–10.5)

## 2017-06-06 LAB — SURGICAL PCR SCREEN
MRSA, PCR: NEGATIVE
Staphylococcus aureus: NEGATIVE

## 2017-06-06 LAB — BASIC METABOLIC PANEL
ANION GAP: 10 (ref 5–15)
BUN: 14 mg/dL (ref 6–20)
CALCIUM: 8.1 mg/dL — AB (ref 8.9–10.3)
CO2: 22 mmol/L (ref 22–32)
CREATININE: 1.24 mg/dL — AB (ref 0.44–1.00)
Chloride: 98 mmol/L — ABNORMAL LOW (ref 101–111)
GFR calc Af Amer: >60 mL/min (ref >60)
GFR, EST NON AFRICAN AMERICAN: 52 mL/min — AB (ref >60)
GLUCOSE: 438 mg/dL — AB (ref 65–99)
Potassium: 4.3 mmol/L (ref 3.5–5.1)
Sodium: 130 mmol/L — ABNORMAL LOW (ref 135–145)

## 2017-06-06 LAB — HEMOGLOBIN A1C
Hgb A1c MFr Bld: 10.8 % — ABNORMAL HIGH (ref 4.8–5.6)
MEAN PLASMA GLUCOSE: 263.26 mg/dL

## 2017-06-06 LAB — GLUCOSE, CAPILLARY: GLUCOSE-CAPILLARY: 398 mg/dL — AB (ref 65–99)

## 2017-06-06 NOTE — Pre-Procedure Instructions (Signed)
Bmet and HgbA1c routed to PCP.

## 2017-06-12 ENCOUNTER — Encounter (HOSPITAL_COMMUNITY)
Admission: RE | Disposition: A | Discharge status: Home / Self Care | Payer: Self-pay | Source: Ambulatory Visit | Attending: Ophthalmology

## 2017-06-12 ENCOUNTER — Ambulatory Visit (HOSPITAL_COMMUNITY): Payer: Medicaid Other | Admitting: Anesthesiology

## 2017-06-12 ENCOUNTER — Ambulatory Visit (HOSPITAL_COMMUNITY)
Admission: RE | Admit: 2017-06-12 | Discharge: 2017-06-12 | Disposition: A | Discharge status: Home / Self Care | Payer: Medicaid Other | Source: Ambulatory Visit | Attending: Ophthalmology | Admitting: Ophthalmology

## 2017-06-12 ENCOUNTER — Encounter (HOSPITAL_COMMUNITY): Payer: Self-pay | Admitting: *Deleted

## 2017-06-12 DIAGNOSIS — E781 Pure hyperglyceridemia: Secondary | ICD-10-CM | POA: Insufficient documentation

## 2017-06-12 DIAGNOSIS — Z7982 Long term (current) use of aspirin: Secondary | ICD-10-CM | POA: Diagnosis not present

## 2017-06-12 DIAGNOSIS — Z79899 Other long term (current) drug therapy: Secondary | ICD-10-CM | POA: Diagnosis not present

## 2017-06-12 DIAGNOSIS — H25041 Posterior subcapsular polar age-related cataract, right eye: Secondary | ICD-10-CM | POA: Insufficient documentation

## 2017-06-12 DIAGNOSIS — K219 Gastro-esophageal reflux disease without esophagitis: Secondary | ICD-10-CM | POA: Insufficient documentation

## 2017-06-12 DIAGNOSIS — E039 Hypothyroidism, unspecified: Secondary | ICD-10-CM | POA: Diagnosis not present

## 2017-06-12 DIAGNOSIS — H2511 Age-related nuclear cataract, right eye: Secondary | ICD-10-CM | POA: Diagnosis not present

## 2017-06-12 DIAGNOSIS — E119 Type 2 diabetes mellitus without complications: Secondary | ICD-10-CM | POA: Diagnosis not present

## 2017-06-12 DIAGNOSIS — Z79890 Hormone replacement therapy (postmenopausal): Secondary | ICD-10-CM | POA: Diagnosis not present

## 2017-06-12 HISTORY — PX: CATARACT EXTRACTION W/PHACO: SHX586

## 2017-06-12 LAB — GLUCOSE, CAPILLARY: GLUCOSE-CAPILLARY: 271 mg/dL — AB (ref 65–99)

## 2017-06-12 SURGERY — PHACOEMULSIFICATION, CATARACT, WITH IOL INSERTION
Anesthesia: Monitor Anesthesia Care | Site: Eye | Laterality: Right

## 2017-06-12 MED ORDER — TETRACAINE HCL 0.5 % OP SOLN
1.0000 [drp] | OPHTHALMIC | Status: AC
  Administered 2017-06-12 (×3): 1 [drp] via OPHTHALMIC

## 2017-06-12 MED ORDER — FENTANYL CITRATE (PF) 100 MCG/2ML IJ SOLN
25.0000 ug | Freq: Once | INTRAMUSCULAR | Status: AC
  Administered 2017-06-12: 25 ug via INTRAVENOUS

## 2017-06-12 MED ORDER — PROVISC 10 MG/ML IO SOLN
INTRAOCULAR | Status: DC | PRN
  Administered 2017-06-12: 0.85 mL via INTRAOCULAR

## 2017-06-12 MED ORDER — LIDOCAINE HCL 3.5 % OP GEL
1.0000 "application " | Freq: Once | OPHTHALMIC | Status: AC
  Administered 2017-06-12: 1 via OPHTHALMIC

## 2017-06-12 MED ORDER — MIDAZOLAM HCL 2 MG/2ML IJ SOLN
INTRAMUSCULAR | Status: AC
  Filled 2017-06-12: qty 2

## 2017-06-12 MED ORDER — BSS IO SOLN
INTRAOCULAR | Status: DC | PRN
  Administered 2017-06-12: 15 mL

## 2017-06-12 MED ORDER — EPINEPHRINE PF 1 MG/ML IJ SOLN
INTRAOCULAR | Status: DC | PRN
  Administered 2017-06-12: 500 mL

## 2017-06-12 MED ORDER — PHENYLEPHRINE HCL 2.5 % OP SOLN
1.0000 [drp] | OPHTHALMIC | Status: AC
  Administered 2017-06-12 (×3): 1 [drp] via OPHTHALMIC

## 2017-06-12 MED ORDER — POVIDONE-IODINE 5 % OP SOLN
OPHTHALMIC | Status: DC | PRN
  Administered 2017-06-12: 1 via OPHTHALMIC

## 2017-06-12 MED ORDER — FENTANYL CITRATE (PF) 100 MCG/2ML IJ SOLN
INTRAMUSCULAR | Status: AC
Start: 2017-06-12 — End: ?
  Filled 2017-06-12: qty 2

## 2017-06-12 MED ORDER — NEOMYCIN-POLYMYXIN-DEXAMETH 3.5-10000-0.1 OP SUSP
OPHTHALMIC | Status: DC | PRN
  Administered 2017-06-12: 2 [drp] via OPHTHALMIC

## 2017-06-12 MED ORDER — LIDOCAINE HCL (PF) 1 % IJ SOLN
INTRAMUSCULAR | Status: DC | PRN
  Administered 2017-06-12: .5 mL

## 2017-06-12 MED ORDER — LACTATED RINGERS IV SOLN
INTRAVENOUS | Status: DC
  Administered 2017-06-12: 07:00:00 via INTRAVENOUS

## 2017-06-12 MED ORDER — CYCLOPENTOLATE-PHENYLEPHRINE 0.2-1 % OP SOLN
1.0000 [drp] | OPHTHALMIC | Status: AC
  Administered 2017-06-12 (×3): 1 [drp] via OPHTHALMIC

## 2017-06-12 MED ORDER — MIDAZOLAM HCL 2 MG/2ML IJ SOLN
1.0000 mg | INTRAMUSCULAR | Status: AC
  Administered 2017-06-12: 2 mg via INTRAVENOUS

## 2017-06-12 SURGICAL SUPPLY — 10 items
CLOTH BEACON ORANGE TIMEOUT ST (SAFETY) ×2 IMPLANT
EYE SHIELD UNIVERSAL CLEAR (GAUZE/BANDAGES/DRESSINGS) ×2 IMPLANT
GLOVE BIOGEL PI IND STRL 7.0 (GLOVE) IMPLANT
GLOVE BIOGEL PI INDICATOR 7.0 (GLOVE) ×2
LENS ALC ACRYL/TECN (Ophthalmic Related) ×2 IMPLANT
PAD ARMBOARD 7.5X6 YLW CONV (MISCELLANEOUS) ×2 IMPLANT
SYRINGE LUER LOK 1CC (MISCELLANEOUS) ×2 IMPLANT
TAPE SURG TRANSPORE 1 IN (GAUZE/BANDAGES/DRESSINGS) IMPLANT
TAPE SURGICAL TRANSPORE 1 IN (GAUZE/BANDAGES/DRESSINGS) ×2
WATER STERILE IRR 250ML POUR (IV SOLUTION) ×2 IMPLANT

## 2017-06-12 NOTE — Transfer of Care (Signed)
Immediate Anesthesia Transfer of Care Note  Patient: Diane Morgan  Procedure(s) Performed: CATARACT EXTRACTION WITH PHACOEMULSIFICATION  AND INTRAOCULAR LENS PLACEMENT RIGHT EYE (Right Eye)  Patient Location: Short Stay  Anesthesia Type:MAC  Level of Consciousness: awake  Airway & Oxygen Therapy: Patient Spontanous Breathing  Post-op Assessment: Report given to RN  Post vital signs: Reviewed  Last Vitals:  Vitals Value Taken Time  BP    Temp    Pulse    Resp    SpO2      Last Pain:  Vitals:   06/12/17 0648  TempSrc: Oral         Complications: No apparent anesthesia complications

## 2017-06-12 NOTE — Discharge Instructions (Signed)

## 2017-06-12 NOTE — Anesthesia Postprocedure Evaluation (Signed)
Anesthesia Post Note  Patient: Diane Morgan  Procedure(s) Performed: CATARACT EXTRACTION WITH PHACOEMULSIFICATION  AND INTRAOCULAR LENS PLACEMENT RIGHT EYE (Right Eye)  Patient location during evaluation: Short Stay Anesthesia Type: MAC Level of consciousness: awake and alert and oriented Pain management: pain level controlled Vital Signs Assessment: post-procedure vital signs reviewed and stable Respiratory status: spontaneous breathing Cardiovascular status: blood pressure returned to baseline Postop Assessment: no apparent nausea or vomiting Anesthetic complications: no     Last Vitals:  Vitals:   06/12/17 0730 06/12/17 0745  BP: 136/84 131/85  Resp: 18 17  Temp:    SpO2: 99% 98%    Last Pain:  Vitals:   06/12/17 0648  TempSrc: Oral                 Beauford Lando

## 2017-06-12 NOTE — Anesthesia Preprocedure Evaluation (Signed)
Anesthesia Evaluation  Patient identified by MRN, date of birth, ID band Patient awake    Reviewed: Allergy & Precautions, NPO status , Patient's Chart, lab work & pertinent test results  Airway Mallampati: II  TM Distance: >3 FB Neck ROM: Full    Dental  (+) Poor Dentition, Missing, Chipped, Dental Advisory Given   Pulmonary neg pulmonary ROS,    breath sounds clear to auscultation       Cardiovascular hypertension, Pt. on medications  Rhythm:Regular Rate:Normal     Neuro/Psych  Neuromuscular disease    GI/Hepatic GERD  Controlled and Medicated,  Endo/Other  diabetes, Poorly Controlled, Type 2Hypothyroidism Morbid obesity  Renal/GU      Musculoskeletal   Abdominal   Peds  Hematology   Anesthesia Other Findings   Reproductive/Obstetrics                             Anesthesia Physical Anesthesia Plan  ASA: III  Anesthesia Plan: MAC   Post-op Pain Management:    Induction: Intravenous  PONV Risk Score and Plan:   Airway Management Planned: Nasal Cannula  Additional Equipment:   Intra-op Plan:   Post-operative Plan:   Informed Consent: I have reviewed the patients History and Physical, chart, labs and discussed the procedure including the risks, benefits and alternatives for the proposed anesthesia with the patient or authorized representative who has indicated his/her understanding and acceptance.     Plan Discussed with:   Anesthesia Plan Comments:         Anesthesia Quick Evaluation

## 2017-06-12 NOTE — H&P (Signed)
I have reviewed the H&P, the patient was re-examined, and I have identified no interval changes in medical condition and plan of care since the history and physical of record  

## 2017-06-12 NOTE — Op Note (Signed)
Date of Admission: 06/12/2017  Date of Surgery: 06/12/2017  Pre-Op Dx: Cataract Right  Eye  Post-Op Dx: Senile Posterior Subcapsular Cataract  Right  Eye,  Dx Code S49.675  Surgeon: Tonny Branch, M.D.  Assistants: None  Anesthesia: Topical with MAC  Indications: Painless, progressive loss of vision with compromise of daily activities.  Surgery: Cataract Extraction with Intraocular lens Implant Right Eye  Discription: The patient had dilating drops and viscous lidocaine placed into the Right eye in the pre-op holding area. After transfer to the operating room, a time out was performed. The patient was then prepped and draped. Beginning with a 63m blade a paracentesis port was made at the surgeon's 2 o'clock position. The anterior chamber was then filled with 1% non-preserved lidocaine. This was followed by filling the anterior chamber with Provisc.  A 2.482mkeratome blade was used to make a clear corneal incision at the temporal limbus.  A bent cystatome needle was used to create a continuous tear capsulotomy. Hydrodissection was performed with balanced salt solution on a Fine canula. The lens nucleus was then removed using the phacoemulsification handpiece. Residual cortex was removed with the I&A handpiece. The anterior chamber and capsular bag were refilled with Provisc. A posterior chamber intraocular lens was placed into the capsular bag with it's injector. The implant was positioned with the Kuglan hook. The Provisc was then removed from the anterior chamber and capsular bag with the I&A handpiece. Stromal hydration of the main incision and paracentesis port was performed with BSS on a Fine canula. The wounds were tested for leak which was negative. The patient tolerated the procedure well. There were no operative complications. The patient was then transferred to the recovery room in stable condition.  Complications: None  Specimen: None  EBL: None  Prosthetic device: J&J Technis, PCB00,  power 21.5, SN [id number]

## 2017-06-13 ENCOUNTER — Encounter (HOSPITAL_COMMUNITY): Payer: Self-pay | Admitting: Ophthalmology

## 2017-06-13 NOTE — Addendum Note (Signed)
Addendum  created 06/13/17 1436 by Moshe Salisburyaniel, Yulia Ulrich E, CRNA   Intraprocedure Staff edited

## 2017-06-22 ENCOUNTER — Encounter: Payer: Self-pay | Admitting: Family Medicine

## 2017-06-22 ENCOUNTER — Ambulatory Visit: Payer: Medicaid Other | Admitting: Family Medicine

## 2017-06-22 VITALS — BP 150/90 | HR 79 | Temp 97.2°F | Ht 60.0 in | Wt 214.2 lb

## 2017-06-22 DIAGNOSIS — E118 Type 2 diabetes mellitus with unspecified complications: Secondary | ICD-10-CM

## 2017-06-22 DIAGNOSIS — I10 Essential (primary) hypertension: Secondary | ICD-10-CM

## 2017-06-22 MED ORDER — EMPAGLIFLOZIN 10 MG PO TABS: 10.0000 mg | ORAL_TABLET | Freq: Every day | ORAL | 3 refills | Status: DC

## 2017-06-22 MED ORDER — FLUCONAZOLE 150 MG PO TABS: ORAL_TABLET | ORAL | 0 refills | Status: DC

## 2017-06-22 NOTE — Patient Instructions (Signed)
Great to see you!  You Bydureon, start Jardiance 1 pill once daily.  If you develop vaginal itching or discharge consistent with yeast infection please go ahead and take a Diflucan for treatment.  We will arrange a follow-up appointment with endocrinology.

## 2017-06-22 NOTE — Progress Notes (Signed)
   HPI  Patient presents today here to follow-up for chronic medical conditions as well as cough.  Type 2 diabetes Reports good medication compliance States that she has not been watching her diet quite as closely as usual. Fasting blood sugars have been very elevated in the low 300s and sometimes upper 200s. No hypoglycemia.  We previously stopped metformin due to metabolic acidosis.     PMH: Smoking status noted ROS: Per HPI  Objective: BP (!) 150/90   Pulse 79   Temp (!) 97.2 F (36.2 C) (Oral)   Ht 5' (1.524 m)   Wt 214 lb 3.2 oz (97.2 kg)   SpO2 99%   BMI 41.83 kg/m  Gen: NAD, alert, cooperative with exam HEENT: NCAT CV: RRR, good S1/S2, no murmur Resp: CTABL, no wheezes, non-labored Ext: No edema, warm Neuro: Alert and oriented, No gross deficits  Diabetic Foot Exam - Simple   Simple Foot Form Diabetic Foot exam was performed with the following findings:  Yes 06/22/2017  8:30 AM  Visual Inspection See comments:  Yes Sensation Testing Intact to touch and monofilament testing bilaterally:  Yes Pulse Check Posterior Tibialis and Dorsalis pulse intact bilaterally:  Yes Comments Prominent bunion bilaterally      Assessment and plan:  #Type 2 diabetes Uncontrolled Continue GLP, adding SGL T-2, also given Diflucan to keep on hand in case she has yeast vaginitis. Referral to endocrinology  #Hypertension Elevated today, previously very well controlled without medication No changes for now  #Morbid obesity Patient has had very good improvement over the last year.  She has done well with GLP is watching her diet. Her highest weight back in 2015 was 275, currently 214.   Orders Placed This Encounter  Procedures  . Microalbumin / creatinine urine ratio  . Ambulatory referral to Endocrinology    Referral Priority:   Routine    Referral Type:   Consultation    Referral Reason:   Specialty Services Required    Number of Visits Requested:   1    Meds  ordered this encounter  Medications  . empagliflozin (JARDIANCE) 10 MG TABS tablet    Sig: Take 10 mg by mouth daily.    Dispense:  30 tablet    Refill:  3  . fluconazole (DIFLUCAN) 150 MG tablet    Sig: Take one pill and repeat in 3 days    Dispense:  2 tablet    Refill:  0    Murtis SinkSam Bradshaw, MD [facility] 06/22/2017, 8:33 AM

## 2017-06-23 LAB — MICROALBUMIN / CREATININE URINE RATIO
Creatinine, Urine: 48.7 mg/dL
Microalbumin, Urine: <3 ug/mL

## 2017-06-26 ENCOUNTER — Telehealth: Payer: Self-pay | Admitting: Family Medicine

## 2017-06-27 NOTE — Telephone Encounter (Signed)
Mom aware of lab work results.  She thought Dr. Ermalinda MemosBradshaw had mentioned Jardiance but [facility] told her they did not receive it.  I contacted the pharmacy and they do have it but it requires prior authorization which we did not receive.  [facility]  Two weeks worth of samples were placed up front for the patient to pick up until we get an answer on the prior authorization.

## 2017-07-27 ENCOUNTER — Encounter: Payer: Self-pay | Admitting: "Endocrinology

## 2017-07-27 ENCOUNTER — Telehealth: Payer: Self-pay | Admitting: "Endocrinology

## 2017-07-27 ENCOUNTER — Ambulatory Visit: Payer: Medicaid Other | Admitting: "Endocrinology

## 2017-07-27 VITALS — BP 125/80 | HR 84 | Ht 60.0 in | Wt 206.0 lb

## 2017-07-27 DIAGNOSIS — E039 Hypothyroidism, unspecified: Secondary | ICD-10-CM | POA: Diagnosis not present

## 2017-07-27 DIAGNOSIS — E782 Mixed hyperlipidemia: Secondary | ICD-10-CM | POA: Diagnosis not present

## 2017-07-27 DIAGNOSIS — N183 Chronic kidney disease, stage 3 unspecified: Secondary | ICD-10-CM

## 2017-07-27 DIAGNOSIS — E1122 Type 2 diabetes mellitus with diabetic chronic kidney disease: Secondary | ICD-10-CM | POA: Diagnosis not present

## 2017-07-27 DIAGNOSIS — I10 Essential (primary) hypertension: Secondary | ICD-10-CM | POA: Diagnosis not present

## 2017-07-27 MED ORDER — ACCU-CHEK AVIVA DEVI: 0 refills | Status: DC

## 2017-07-27 MED ORDER — GLUCOSE BLOOD VI STRP: ORAL_STRIP | 3 refills | Status: DC

## 2017-07-27 NOTE — Patient Instructions (Signed)

## 2017-07-27 NOTE — Telephone Encounter (Signed)
Lottie Rhondas mother is calling stating she has a question about Dr. Dennie Bible Insulin dosage instructions, Please advise?

## 2017-07-27 NOTE — Telephone Encounter (Signed)
Left message for pt to call back  °

## 2017-07-27 NOTE — Progress Notes (Signed)
Endocrinology Consult Note       07/27/2017, 5:08 PM   Subjective:    Patient ID: Diane Morgan, female    DOB: 12-02-1970.  Diane Morgan is being seen in consultation for management of currently uncontrolled symptomatic diabetes requested by  Elenora Gamma, MD.   Past Medical History:  Diagnosis Date  . Anemia   . Arthritis   . Cataract    right  . Cellulitis and abscess of trunk 11/28/2012  . Diabetic peripheral neuropathy (HCC)   . DM (diabetes mellitus) (HCC)   . Eczema   . GERD (gastroesophageal reflux disease)   . Hyperlipidemia   . Hypertension   . Hypothyroidism   . Leg pain, bilateral 10/08/09  . NIDDM (non-insulin dependent diabetes mellitus)   . URI (upper respiratory infection) 07/01/10   Past Surgical History:  Procedure Laterality Date  . CATARACT EXTRACTION W/PHACO Right 06/12/2017   Procedure: CATARACT EXTRACTION WITH PHACOEMULSIFICATION  AND INTRAOCULAR LENS PLACEMENT RIGHT EYE;  Surgeon: Gemma Payor, MD;  Location: [facility];  Service: Ophthalmology;  Laterality: Right;  CDE: 1.16  . CHOLECYSTECTOMY    . EYE SURGERY     right catracts  . LASER ABLATION CONDOLAMATA N/A 03/08/2017   Procedure: LASER ABLATION OF THE CERVIX;  Surgeon: Lazaro Arms, MD;  Location: [facility];  Service: Gynecology;  Laterality: N/A;  . WISDOM TOOTH EXTRACTION     Social History   Socioeconomic History  . Marital status: Single    Spouse name: Not on file  . Number of children: Not on file  . Years of education: Not on file  . Highest education level: Not on file  Occupational History  . Not on file  Social Needs  . Financial resource strain: Not on file  . Food insecurity:    Worry: Not on file    Inability: Not on file  . Transportation needs:    Medical: Not on file    Non-medical: Not on file  Tobacco Use  . Smoking status: Never Smoker  . Smokeless tobacco: Never Used   Substance and Sexual Activity  . Alcohol use: No  . Drug use: No  . Sexual activity: Not Currently    Birth control/protection: None  Lifestyle  . Physical activity:    Days per week: Not on file    Minutes per session: Not on file  . Stress: Not on file  Relationships  . Social connections:    Talks on phone: Not on file    Gets together: Not on file    Attends religious service: Not on file    Active member of club or organization: Not on file    Attends meetings of clubs or organizations: Not on file    Relationship status: Not on file  Other Topics Concern  . Not on file  Social History Narrative  . Not on file   Outpatient Encounter Medications as of 07/27/2017  Medication Sig  . aspirin 81 MG chewable tablet Chew 81 mg by mouth daily.    Marland Kitchen atorvastatin (LIPITOR) 40 MG tablet TAKE 40 MG BY MOUTH ONCE DAILY  .  BYDUREON 2 MG PEN once a week.  . cetirizine (ZYRTEC) 10 MG tablet Take 10 mg by mouth at bedtime.   . cycloSPORINE (RESTASIS) 0.05 % ophthalmic emulsion 1 drop 2 (two) times daily.  . fenofibrate (TRICOR) 145 MG tablet Take 1 tablet (145 mg total) by mouth daily.  Marland Kitchen gabapentin (NEURONTIN) 300 MG capsule 600 mg 2 (two) times daily.  Marland Kitchen levothyroxine (SYNTHROID, LEVOTHROID) 50 MCG tablet TAKE ONE TABLET BY MOUTH ONCE DAILY (Patient taking differently: TAKE 50 MCG BY MOUTH ONCE DAILY)  . omeprazole (PRILOSEC) 40 MG capsule Take 1 capsule (40 mg total) by mouth daily.  . [DISCONTINUED] empagliflozin (JARDIANCE) 10 MG TABS tablet Take 10 mg by mouth daily.  . Blood Glucose Monitoring Suppl (ACCU-CHEK AVIVA) device Use as instructed  . glucose blood (ACCU-CHEK AVIVA) test strip Use to test glucose 4 times a day.  . ibuprofen (ADVIL,MOTRIN) 200 MG tablet Take 400 mg by mouth every 6 (six) hours as needed for headache or moderate pain.  . [DISCONTINUED] fluconazole (DIFLUCAN) 150 MG tablet Take one pill and repeat in 3 days   No facility-administered encounter medications on  file as of 07/27/2017.     ALLERGIES: Allergies  Allergen Reactions  . Penicillins Anaphylaxis, Hives and Other (See Comments)    Childhood Reaction. Has patient had a PCN reaction causing immediate rash, facial/tongue/throat swelling, SOB or lightheadedness with hypotension: Yes Has patient had a PCN reaction causing severe rash involving mucus membranes or skin necrosis: No Has patient had a PCN reaction that required hospitalization: No Has patient had a PCN reaction occurring within the last 10 years: No If all of the above answers are "NO", then may proceed with Cephalosporin use.   . Niaspan [Niacin] Other (See Comments)    Patient passes out.    VACCINATION STATUS: Immunization History  Administered Date(s) Administered  . Influenza,inj,Quad PF,6+ Mos 11/28/2012, 01/22/2014, 12/26/2014, 12/07/2015, 12/15/2016  . Tdap 03/19/2013    Diabetes  She presents for her initial diabetic visit. She has type 2 diabetes mellitus. Onset time: She was diagnosed at approximate age of 40years. Her disease course has been worsening. There are no hypoglycemic associated symptoms. Pertinent negatives for hypoglycemia include no confusion, headaches, pallor or seizures. Associated symptoms include blurred vision, fatigue, polydipsia and polyuria. Pertinent negatives for diabetes include no chest pain and no polyphagia. There are no hypoglycemic complications. Symptoms are worsening. Diabetic complications include nephropathy. Risk factors for coronary artery disease include dyslipidemia, diabetes mellitus, family history, obesity, hypertension, sedentary lifestyle and tobacco exposure. Current diabetic treatments: She is on Jardiance 25 mg p.o. daily and Bydureon 2 mg [place]  weekly. Her weight is fluctuating minimally. She is following a generally unhealthy diet. When asked about meal planning, she reported none. She has not had a previous visit with a dietitian. She rarely participates in exercise. (Patient  did not bring any meter nor logs to review.  Her most recent A1c was high at 10.8%.) An ACE inhibitor/angiotensin II receptor blocker is not being taken. She does not see a podiatrist.Eye exam is current (Status post cataract extraction.).  Hyperlipidemia  This is a chronic problem. The problem is uncontrolled. Recent lipid tests were reviewed and are variable. Exacerbating diseases include diabetes, hypothyroidism and obesity. Pertinent negatives include no chest pain, myalgias or shortness of breath. Current antihyperlipidemic treatment includes statins. Risk factors for coronary artery disease include diabetes mellitus, dyslipidemia, obesity, hypertension, a sedentary lifestyle and family history.  Hypertension  This is a chronic problem. The  current episode started more than 1 year ago. The problem is controlled. Associated symptoms include blurred vision. Pertinent negatives include no chest pain, headaches, palpitations or shortness of breath. Risk factors for coronary artery disease include dyslipidemia, diabetes mellitus, obesity, sedentary lifestyle and smoking/tobacco exposure. Hypertensive end-organ damage includes kidney disease.      Review of Systems  Constitutional: Positive for fatigue. Negative for chills, fever and unexpected weight change.  HENT: Negative for trouble swallowing and voice change.   Eyes: Positive for blurred vision. Negative for visual disturbance.  Respiratory: Negative for cough, shortness of breath and wheezing.   Cardiovascular: Negative for chest pain, palpitations and leg swelling.  Gastrointestinal: Negative for diarrhea, nausea and vomiting.  Endocrine: Positive for polydipsia and polyuria. Negative for cold intolerance, heat intolerance and polyphagia.  Musculoskeletal: Negative for arthralgias and myalgias.  Skin: Negative for color change, pallor, rash and wound.  Neurological: Negative for seizures and headaches.  Psychiatric/Behavioral: Negative for  confusion and suicidal ideas.    Objective:    BP 125/80   Pulse 84   Ht 5' (1.524 m)   Wt 206 lb (93.4 kg)   BMI 40.23 kg/m   Wt Readings from Last 3 Encounters:  07/27/17 206 lb (93.4 kg)  06/22/17 214 lb 3.2 oz (97.2 kg)  06/06/17 216 lb (98 kg)     Physical Exam  Constitutional: She is oriented to person, place, and time. She appears well-developed.  HENT:  Head: Normocephalic and atraumatic.  Poor dental condition.  Eyes: EOM are normal.  Neck: Normal range of motion. Neck supple. No tracheal deviation present. No thyromegaly present.  Cardiovascular: Normal rate and regular rhythm.  Pulmonary/Chest: Effort normal and breath sounds normal.  Abdominal: Soft. Bowel sounds are normal. There is no tenderness. There is no guarding.  Musculoskeletal: Normal range of motion. She exhibits no edema.  Neurological: She is alert and oriented to person, place, and time. She has normal reflexes. No cranial nerve deficit. Coordination normal.  Skin: Skin is warm and dry. No rash noted. No erythema. No pallor.  Psychiatric: She has a normal mood and affect. Judgment normal.    CMP ( most recent) CMP     Component Value Date/Time   NA 130 (L) 06/06/2017 0811   NA 137 03/23/2017 0941   K 4.3 06/06/2017 0811   CL 98 (L) 06/06/2017 0811   CO2 22 06/06/2017 0811   GLUCOSE 438 (H) 06/06/2017 0811   BUN 14 06/06/2017 0811   BUN 13 03/23/2017 0941   CREATININE 1.24 (H) 06/06/2017 0811   CREATININE 1.13 (H) 08/17/2012 1031   CALCIUM 8.1 (L) 06/06/2017 0811   PROT 6.1 03/23/2017 0941   ALBUMIN 3.5 03/23/2017 0941   AST 12 03/23/2017 0941   ALT 9 03/23/2017 0941   ALKPHOS 74 03/23/2017 0941   BILITOT 0.2 03/23/2017 0941   GFRNONAA 52 (L) 06/06/2017 0811   GFRNONAA 61 08/17/2012 1031   GFRAA >60 06/06/2017 0811   GFRAA 70 08/17/2012 1031     Diabetic Labs (most recent): Lab Results  Component Value Date   HGBA1C 10.8 (H) 06/06/2017   HGBA1C 7.8 (H) 03/03/2017   HGBA1C 8.1  02/25/2015     Lipid Panel ( most recent) Lipid Panel     Component Value Date/Time   CHOL 108 03/23/2017 0941   CHOL 85 08/17/2012 1031   TRIG 187 (H) 03/23/2017 0941   TRIG 161 (H) 08/17/2012 1031   HDL 29 (L) 03/23/2017 0941   HDL 26 (  L) 08/17/2012 1031   CHOLHDL 3.7 03/23/2017 0941   CHOLHDL 10.3 11/29/2012 1030   VLDL 39 11/29/2012 1030   LDLCALC 42 03/23/2017 0941   LDLCALC 27 08/17/2012 1031      Lab Results  Component Value Date   TSH 1.820 12/15/2016   TSH 1.760 12/07/2015   TSH 3.780 03/27/2015   TSH 4.950 (H) 11/14/2014   TSH 2.990 03/19/2013   TSH 2.602 11/28/2012   TSH 3.340 11/27/2012   FREET4 1.19 11/14/2014      Assessment & Plan:   1. Type 2 diabetes mellitus with stage 3 chronic kidney disease, without long-term current use of insulin (HCC)  - Diane Morgan has currently uncontrolled symptomatic type 2 DM since 47 years of age,  with most recent A1c of 10.8 %. Recent labs reviewed.  -her diabetes is complicated by obesity/sedentary life, smoking, and Diane Morgan remains at a high risk for more acute and chronic complications which include CAD, CVA, CKD, retinopathy, and neuropathy. These are all discussed in detail with the patient.  - I have counseled her on diet management and weight loss, by adopting a carbohydrate restricted/protein rich diet.  - Suggestion is made for her to avoid simple carbohydrates  from her diet including Cakes, Sweet Desserts, Ice Cream, Soda (diet and regular), Sweet Tea, Candies, Chips, Cookies, Store Bought Juices, Alcohol in Excess of  1-2 drinks a day, Artificial Sweeteners, and "Sugar-free" Products. This will help patient to have stable blood glucose profile and potentially avoid unintended weight gain.  - I encouraged her to switch to  unprocessed or minimally processed complex starch and increased protein intake (animal or plant source), fruits, and vegetables.  - she is advised to stick to a routine mealtimes  to eat 3 meals  a day and avoid unnecessary snacks ( to snack only to correct hypoglycemia).   - she will be scheduled with Diane Morgan, RDN, CDE for individualized diabetes education.  - I have approached her with the following individualized plan to manage diabetes and patient agrees:   -Based on her current glycemic burden, she may require insulin treatment in order for her to achieve control of diabetes to target.  -Patient seems to have significant cognitive deficit, safe use of insulin should be assured before initiation.  Her mother is offering to help. -I have requested for her to start monitoring blood glucose 4 times a day-before meals and at bedtime and return in 1 week with her meter and logs for reevaluation.  -The meantime, I advised her to discontinue Jardiance due to CKD, not a good candidate for full dose metformin. -She will continue to benefit from Bydureon.  I advised her to continue Bydureon 2 mg subcutaneously weekly.  -Patient is encouraged to call clinic for blood glucose levels less than 70 or above 300 mg /dl. - Patient specific target  A1c;  LDL, HDL, Triglycerides, and  Waist Circumference were discussed in detail.  2) BP/HTN: Her blood pressure is controlled to target.  She is not on any antihypertensive medications.    3) Lipids/HPL:   Her recent lipid panel showed hypertriglyceridemia and controlled LDL at 42.  I advised her to continue atorvastatin 40 mg p.o. nightly.    4)  Weight/Diet: CDE Consult will be initiated , exercise, and detailed carbohydrates information provided.  5) hypothyroidism: -Diagnosed at approximate age of 42 years. -Her recent thyroid function tests are consistent with appropriate replacement.  I advised her to continue her current dose  of levothyroxine 50 mcg p.o. nightly.  - We discussed about correct intake of levothyroxine, at fasting, with water, separated by at least 30 minutes from breakfast, and separated by more than 4 hours  from calcium, iron, multivitamins, acid reflux medications (PPIs). -Patient is made aware of the fact that thyroid hormone replacement is needed for life, dose to be adjusted by periodic monitoring of thyroid function tests.  6) Chronic Care/Health Maintenance:  -she  is on  Statin medications and  is encouraged to continue to follow up with Ophthalmology, Dentist,  Podiatrist at least yearly or according to recommendations, and advised to  Quit smoking. I have recommended yearly flu vaccine and pneumonia vaccination at least every 5 years; moderate intensity exercise for up to 150 minutes weekly; and  sleep for at least 7 hours a day.  - I advised patient to maintain close follow up with Elenora Gamma, MD for primary care needs.  - Time spent with the patient: 45 minutes, of which >50% was spent in obtaining information about her symptoms, reviewing her previous labs, evaluations, and treatments, counseling her about her her currently uncontrolled, complicated type 2 diabetes, hypothyroidism, hyperlipidemia, hypertension, and developing a plan to confirm the diagnosis and long term treatment as necessary.  Diane Morgan participated in the discussions, expressed understanding, and voiced agreement with the above plans.  All questions were answered to her satisfaction. she is encouraged to contact clinic should she have any questions or concerns prior to her return visit.  Follow up plan: - Return in about 1 week (around 08/03/2017) for follow up with meter and logs- no labs.  Marquis Lunch, MD [facility] [address] [place], [place] [address] Phone: [phone]  Fax: [phone]    07/27/2017, 5:08 PM  This note was partially dictated with voice recognition software. Similar sounding words can be transcribed inadequately or may not  be corrected upon review.

## 2017-08-07 ENCOUNTER — Encounter: Payer: Self-pay | Admitting: "Endocrinology

## 2017-08-07 ENCOUNTER — Ambulatory Visit (INDEPENDENT_AMBULATORY_CARE_PROVIDER_SITE_OTHER): Payer: Medicaid Other | Admitting: "Endocrinology

## 2017-08-07 VITALS — BP 119/81 | HR 74 | Ht 60.0 in | Wt 201.0 lb

## 2017-08-07 DIAGNOSIS — E039 Hypothyroidism, unspecified: Secondary | ICD-10-CM

## 2017-08-07 DIAGNOSIS — E1122 Type 2 diabetes mellitus with diabetic chronic kidney disease: Secondary | ICD-10-CM | POA: Diagnosis not present

## 2017-08-07 DIAGNOSIS — N183 Chronic kidney disease, stage 3 unspecified: Secondary | ICD-10-CM

## 2017-08-07 DIAGNOSIS — E782 Mixed hyperlipidemia: Secondary | ICD-10-CM | POA: Diagnosis not present

## 2017-08-07 DIAGNOSIS — I10 Essential (primary) hypertension: Secondary | ICD-10-CM | POA: Diagnosis not present

## 2017-08-07 MED ORDER — LEVOTHYROXINE SODIUM 75 MCG PO TABS: 75.0000 ug | ORAL_TABLET | Freq: Every day | ORAL | 6 refills | Status: DC

## 2017-08-07 NOTE — Progress Notes (Signed)
Endocrinology Consult Note       08/07/2017, 11:19 AM   Subjective:    Patient ID: Diane Morgan, female    DOB: 26-Sep-1970.  Diane Morgan is being seen in consultation for management of currently uncontrolled symptomatic diabetes requested by  Elenora Gamma, MD.   Past Medical History:  Diagnosis Date  . Anemia   . Arthritis   . Cataract    right  . Cellulitis and abscess of trunk 11/28/2012  . Diabetic peripheral neuropathy (HCC)   . DM (diabetes mellitus) (HCC)   . Eczema   . GERD (gastroesophageal reflux disease)   . Hyperlipidemia   . Hypertension   . Hypothyroidism   . Leg pain, bilateral 10/08/09  . NIDDM (non-insulin dependent diabetes mellitus)   . URI (upper respiratory infection) 07/01/10   Past Surgical History:  Procedure Laterality Date  . CATARACT EXTRACTION W/PHACO Right 06/12/2017   Procedure: CATARACT EXTRACTION WITH PHACOEMULSIFICATION  AND INTRAOCULAR LENS PLACEMENT RIGHT EYE;  Surgeon: Gemma Payor, MD;  Location: [facility];  Service: Ophthalmology;  Laterality: Right;  CDE: 1.16  . CHOLECYSTECTOMY    . EYE SURGERY     right catracts  . LASER ABLATION CONDOLAMATA N/A 03/08/2017   Procedure: LASER ABLATION OF THE CERVIX;  Surgeon: Lazaro Arms, MD;  Location: [facility];  Service: Gynecology;  Laterality: N/A;  . WISDOM TOOTH EXTRACTION     Social History   Socioeconomic History  . Marital status: Single    Spouse name: Not on file  . Number of children: Not on file  . Years of education: Not on file  . Highest education level: Not on file  Occupational History  . Not on file  Social Needs  . Financial resource strain: Not on file  . Food insecurity:    Worry: Not on file    Inability: Not on file  . Transportation needs:    Medical: Not on file    Non-medical: Not on file  Tobacco Use  . Smoking status: Never Smoker  . Smokeless tobacco: Never Used   Substance and Sexual Activity  . Alcohol use: No  . Drug use: No  . Sexual activity: Not Currently    Birth control/protection: None  Lifestyle  . Physical activity:    Days per week: Not on file    Minutes per session: Not on file  . Stress: Not on file  Relationships  . Social connections:    Talks on phone: Not on file    Gets together: Not on file    Attends religious service: Not on file    Active member of club or organization: Not on file    Attends meetings of clubs or organizations: Not on file    Relationship status: Not on file  Other Topics Concern  . Not on file  Social History Narrative  . Not on file   Outpatient Encounter Medications as of 08/07/2017  Medication Sig  . aspirin 81 MG chewable tablet Chew 81 mg by mouth daily.    Marland Kitchen atorvastatin (LIPITOR) 40 MG tablet TAKE 40 MG BY MOUTH ONCE DAILY  .  Blood Glucose Monitoring Suppl (ACCU-CHEK AVIVA) device Use as instructed  . BYDUREON 2 MG PEN once a week.  . cetirizine (ZYRTEC) 10 MG tablet Take 10 mg by mouth at bedtime.   . cycloSPORINE (RESTASIS) 0.05 % ophthalmic emulsion 1 drop 2 (two) times daily.  . fenofibrate (TRICOR) 145 MG tablet Take 1 tablet (145 mg total) by mouth daily.  Marland Kitchen gabapentin (NEURONTIN) 300 MG capsule 600 mg 2 (two) times daily.  Marland Kitchen glucose blood (ACCU-CHEK AVIVA) test strip Use to test glucose 4 times a day.  . ibuprofen (ADVIL,MOTRIN) 200 MG tablet Take 400 mg by mouth every 6 (six) hours as needed for headache or moderate pain.  Marland Kitchen levothyroxine (SYNTHROID, LEVOTHROID) 75 MCG tablet Take 1 tablet (75 mcg total) by mouth daily.  Marland Kitchen omeprazole (PRILOSEC) 40 MG capsule Take 1 capsule (40 mg total) by mouth daily.  . [DISCONTINUED] levothyroxine (SYNTHROID, LEVOTHROID) 50 MCG tablet TAKE ONE TABLET BY MOUTH ONCE DAILY (Patient taking differently: TAKE 50 MCG BY MOUTH ONCE DAILY)   No facility-administered encounter medications on file as of 08/07/2017.     ALLERGIES: Allergies  Allergen  Reactions  . Penicillins Anaphylaxis, Hives and Other (See Comments)    Childhood Reaction. Has patient had a PCN reaction causing immediate rash, facial/tongue/throat swelling, SOB or lightheadedness with hypotension: Yes Has patient had a PCN reaction causing severe rash involving mucus membranes or skin necrosis: No Has patient had a PCN reaction that required hospitalization: No Has patient had a PCN reaction occurring within the last 10 years: No If all of the above answers are "NO", then may proceed with Cephalosporin use.   . Niaspan [Niacin] Other (See Comments)    Patient passes out.    VACCINATION STATUS: Immunization History  Administered Date(s) Administered  . Influenza,inj,Quad PF,6+ Mos 11/28/2012, 01/22/2014, 12/26/2014, 12/07/2015, 12/15/2016  . Tdap 03/19/2013    Diabetes  She presents for her follow-up diabetic visit. She has type 2 diabetes mellitus. Onset time: She was diagnosed at approximate age of 47years. Her disease course has been improving. There are no hypoglycemic associated symptoms. Pertinent negatives for hypoglycemia include no confusion, headaches, pallor or seizures. Pertinent negatives for diabetes include no blurred vision, no chest pain, no fatigue, no polydipsia, no polyphagia and no polyuria. There are no hypoglycemic complications. Symptoms are improving. Diabetic complications include nephropathy. Risk factors for coronary artery disease include dyslipidemia, diabetes mellitus, family history, obesity, hypertension, sedentary lifestyle and tobacco exposure. Current diabetic treatments: She is on Jardiance 25 mg p.o. daily and Bydureon 2 mg [place]  weekly. Her weight is decreasing steadily. She is following a generally unhealthy diet. When asked about meal planning, she reported none. She has not had a previous visit with a dietitian. She rarely participates in exercise. Her breakfast blood glucose range is generally 140-180 mg/dl. Her lunch blood glucose  range is generally 140-180 mg/dl. Her dinner blood glucose range is generally 140-180 mg/dl. Her bedtime blood glucose range is generally 140-180 mg/dl. Her overall blood glucose range is 140-180 mg/dl. (Patient came with significantly improved blood glucose profile.   Her most recent A1c was high at 10.8%.) An ACE inhibitor/angiotensin II receptor blocker is not being taken. She does not see a podiatrist.Eye exam is current (Status post cataract extraction.).  Hyperlipidemia  This is a chronic problem. The problem is uncontrolled. Recent lipid tests were reviewed and are variable. Exacerbating diseases include diabetes, hypothyroidism and obesity. Pertinent negatives include no chest pain, myalgias or shortness of breath. Current antihyperlipidemic  treatment includes statins. Risk factors for coronary artery disease include diabetes mellitus, dyslipidemia, obesity, hypertension, a sedentary lifestyle and family history.  Hypertension  This is a chronic problem. The current episode started more than 1 year ago. The problem is controlled. Pertinent negatives include no blurred vision, chest pain, headaches, palpitations or shortness of breath. Risk factors for coronary artery disease include dyslipidemia, diabetes mellitus, obesity, sedentary lifestyle and smoking/tobacco exposure. Hypertensive end-organ damage includes kidney disease.    Review of Systems  Constitutional: Negative for chills, fatigue, fever and unexpected weight change.  HENT: Negative for trouble swallowing and voice change.   Eyes: Negative for blurred vision and visual disturbance.  Respiratory: Negative for cough, shortness of breath and wheezing.   Cardiovascular: Negative for chest pain, palpitations and leg swelling.  Gastrointestinal: Negative for diarrhea, nausea and vomiting.  Endocrine: Negative for cold intolerance, heat intolerance, polydipsia, polyphagia and polyuria.  Musculoskeletal: Negative for arthralgias and  myalgias.  Skin: Negative for color change, pallor, rash and wound.  Neurological: Negative for seizures and headaches.  Psychiatric/Behavioral: Negative for confusion and suicidal ideas.    Objective:    BP 119/81   Pulse 74   Ht 5' (1.524 m)   Wt 201 lb (91.2 kg)   BMI 39.26 kg/m   Wt Readings from Last 3 Encounters:  08/07/17 201 lb (91.2 kg)  07/27/17 206 lb (93.4 kg)  06/22/17 214 lb 3.2 oz (97.2 kg)     Physical Exam  Constitutional: She is oriented to person, place, and time. She appears well-developed.  HENT:  Head: Normocephalic and atraumatic.  Poor dental condition.  Eyes: EOM are normal.  Neck: Normal range of motion. Neck supple. No tracheal deviation present. No thyromegaly present.  Cardiovascular: Normal rate.  Pulmonary/Chest: Effort normal.  Abdominal: There is no tenderness. There is no guarding.  Musculoskeletal: Normal range of motion. She exhibits no edema.  Neurological: She is alert and oriented to person, place, and time. She has normal reflexes. No cranial nerve deficit. Coordination (.1a) normal.  Skin: Skin is warm and dry. No rash noted. No erythema. No pallor.  Psychiatric: She has a normal mood and affect. Judgment normal.    CMP ( most recent) CMP     Component Value Date/Time   NA 130 (L) 06/06/2017 0811   NA 137 03/23/2017 0941   K 4.3 06/06/2017 0811   CL 98 (L) 06/06/2017 0811   CO2 22 06/06/2017 0811   GLUCOSE 438 (H) 06/06/2017 0811   BUN 14 06/06/2017 0811   BUN 13 03/23/2017 0941   CREATININE 1.24 (H) 06/06/2017 0811   CREATININE 1.13 (H) 08/17/2012 1031   CALCIUM 8.1 (L) 06/06/2017 0811   PROT 6.1 03/23/2017 0941   ALBUMIN 3.5 03/23/2017 0941   AST 12 03/23/2017 0941   ALT 9 03/23/2017 0941   ALKPHOS 74 03/23/2017 0941   BILITOT 0.2 03/23/2017 0941   GFRNONAA 52 (L) 06/06/2017 0811   GFRNONAA 61 08/17/2012 1031   GFRAA >60 06/06/2017 0811   GFRAA 70 08/17/2012 1031     Diabetic Labs (most recent): Lab Results   Component Value Date   HGBA1C 10.8 (H) 06/06/2017   HGBA1C 7.8 (H) 03/03/2017   HGBA1C 8.1 02/25/2015     Lipid Panel ( most recent) Lipid Panel     Component Value Date/Time   CHOL 108 03/23/2017 0941   CHOL 85 08/17/2012 1031   TRIG 187 (H) 03/23/2017 0941   TRIG 161 (H) 08/17/2012 1031   HDL  29 (L) 03/23/2017 0941   HDL 26 (L) 08/17/2012 1031   CHOLHDL 3.7 03/23/2017 0941   CHOLHDL 10.3 11/29/2012 1030   VLDL 39 11/29/2012 1030   LDLCALC 42 03/23/2017 0941   LDLCALC 27 08/17/2012 1031      Lab Results  Component Value Date   TSH 1.820 12/15/2016   TSH 1.760 12/07/2015   TSH 3.780 03/27/2015   TSH 4.950 (H) 11/14/2014   TSH 2.990 03/19/2013   TSH 2.602 11/28/2012   TSH 3.340 11/27/2012   FREET4 1.19 11/14/2014      Assessment & Plan:   1. Type 2 diabetes mellitus with stage 3 chronic kidney disease, without long-term current use of insulin (HCC)  - Lovetta L Wrede has currently uncontrolled symptomatic type 2 DM since 47 years of age. -She came with near target blood glucose profile since her last visit.  Her recent A1c was high at 10.8%. - Recent labs reviewed.  -her diabetes is complicated by obesity/sedentary life, smoking, and Blakleigh L Clift remains at a high risk for more acute and chronic complications which include CAD, CVA, CKD, retinopathy, and neuropathy. These are all discussed in detail with the patient.  - I have counseled her on diet management and weight loss, by adopting a carbohydrate restricted/protein rich diet.  -  Suggestion is made for her to avoid simple carbohydrates  from her diet including Cakes, Sweet Desserts / Pastries, Ice Cream, Soda (diet and regular), Sweet Tea, Candies, Chips, Cookies, Store Bought Juices, Alcohol in Excess of  1-2 drinks a day, Artificial Sweeteners, and "Sugar-free" Products. This will help patient to have stable blood glucose profile and potentially avoid unintended weight gain.   - I encouraged her to  switch to  unprocessed or minimally processed complex starch and increased protein intake (animal or plant source), fruits, and vegetables.  - she is advised to stick to a routine mealtimes to eat 3 meals  a day and avoid unnecessary snacks ( to snack only to correct hypoglycemia).   - she has been scheduled with Norm Salt, RDN, CDE for individualized diabetes education.  - I have approached her with the following individualized plan to manage diabetes and patient agrees:   - Based on her presentation with near target blood glucose profile, she will not require insulin treatment at this time.  -Patient seems to have significant cognitive deficit, maximizing non-insulin therapeutic options would be entertained before considering insulin treatment.  -She is not a good candidate for full dose metformin. -She will continue to benefit from Bydureon.  I advised her to continue Bydureon 2 mg subcutaneously weekly.  -Patient is encouraged to call clinic for blood glucose levels less than 70 or above 200 mg /dl.  - Patient specific target  A1c;  LDL, HDL, Triglycerides, and  Waist Circumference were discussed in detail.  2) BP/HTN: Her blood pressure is controlled to target.    She is not on any antihypertensive medications.    3) Lipids/HPL:   Her recent lipid panel showed hypertriglyceridemia and controlled LDL at 42.  I advised her to continue atorvastatin 40 mg p.o. Nightly.   4)  Weight/Diet: CDE Consult will be initiated , exercise, and detailed carbohydrates information provided.  5) hypothyroidism: -Diagnosed at approximate age of 42 years. -She will benefit from slight increase in her levothyroxine dose. -I discussed and increase her levothyroxine to 75 mcg p.o. Daily.   - We discussed about correct intake of levothyroxine, at fasting, with water, separated by at  least 30 minutes from breakfast, and separated by more than 4 hours from calcium, iron, multivitamins, acid reflux  medications (PPIs). -Patient is made aware of the fact that thyroid hormone replacement is needed for life, dose to be adjusted by periodic monitoring of thyroid function tests.  6) Chronic Care/Health Maintenance:  -she  is on  Statin medications and  is encouraged to continue to follow up with Ophthalmology, Dentist,  Podiatrist at least yearly or according to recommendations, and advised to  Quit smoking. I have recommended yearly flu vaccine and pneumonia vaccination at least every 5 years; moderate intensity exercise for up to 150 minutes weekly; and  sleep for at least 7 hours a day.  - I advised patient to maintain close follow up with Elenora Gamma, MD for primary care needs.  - Time spent with the patient: 25 min, of which >50% was spent in reviewing her blood glucose logs , discussing her hypo- and hyper-glycemic episodes, reviewing her current and  previous labs and insulin doses and developing a plan to avoid hypo- and hyper-glycemia. Please refer to Patient Instructions for Blood Glucose Monitoring and Insulin/Medications Dosing Guide"  in media tab for additional information. Kallie Locks Harriman participated in the discussions, expressed understanding, and voiced agreement with the above plans.  All questions were answered to her satisfaction. she is encouraged to contact clinic should she have any questions or concerns prior to her return visit.   Follow up plan: - Return in about 5 weeks (around 09/11/2017) for meter, and logs.  Marquis Lunch, MD [facility] [address] [place], [place] [address] Phone: [phone]  Fax: [phone]    08/07/2017, 11:19 AM  This note was partially dictated with voice recognition software. Similar sounding words can be transcribed inadequately or may not  be corrected upon review.

## 2017-08-07 NOTE — Patient Instructions (Signed)

## 2017-09-04 ENCOUNTER — Other Ambulatory Visit: Payer: Self-pay | Admitting: "Endocrinology

## 2017-09-04 ENCOUNTER — Other Ambulatory Visit: Payer: Medicaid Other

## 2017-09-04 DIAGNOSIS — E1165 Type 2 diabetes mellitus with hyperglycemia: Secondary | ICD-10-CM

## 2017-09-04 DIAGNOSIS — E785 Hyperlipidemia, unspecified: Secondary | ICD-10-CM

## 2017-09-04 DIAGNOSIS — E039 Hypothyroidism, unspecified: Secondary | ICD-10-CM

## 2017-09-04 LAB — HEMOGLOBIN A1C
ESTIMATED AVERAGE GLUCOSE: 200 mg/dL
Hgb A1c MFr Bld: 8.6 % — ABNORMAL HIGH (ref 4.8–5.6)

## 2017-09-05 ENCOUNTER — Other Ambulatory Visit: Payer: Self-pay | Admitting: Family Medicine

## 2017-09-05 LAB — LIPID PANEL
CHOL/HDL RATIO: 4.1 ratio (ref 0.0–4.4)
Cholesterol, Total: 106 mg/dL (ref 100–199)
HDL: 26 mg/dL — ABNORMAL LOW (ref >39)
LDL Calculated: 28 mg/dL (ref 0–99)
Triglycerides: 262 mg/dL — ABNORMAL HIGH (ref 0–149)
VLDL CHOLESTEROL CAL: 52 mg/dL — AB (ref 5–40)

## 2017-09-05 LAB — COMPREHENSIVE METABOLIC PANEL
ALK PHOS: 68 IU/L (ref 39–117)
ALT: 10 IU/L (ref 0–32)
AST: 15 IU/L (ref 0–40)
Albumin/Globulin Ratio: 1.4 (ref 1.2–2.2)
Albumin: 3.6 g/dL (ref 3.5–5.5)
BILIRUBIN TOTAL: 0.3 mg/dL (ref 0.0–1.2)
BUN/Creatinine Ratio: 12 (ref 9–23)
BUN: 15 mg/dL (ref 6–24)
CHLORIDE: 106 mmol/L (ref 96–106)
CO2: 21 mmol/L (ref 20–29)
Calcium: 9.3 mg/dL (ref 8.7–10.2)
Creatinine, Ser: 1.24 mg/dL — ABNORMAL HIGH (ref 0.57–1.00)
GFR calc non Af Amer: 53 mL/min/{1.73_m2} — ABNORMAL LOW (ref >59)
GFR, EST AFRICAN AMERICAN: 61 mL/min/{1.73_m2} (ref >59)
GLUCOSE: 143 mg/dL — AB (ref 65–99)
Globulin, Total: 2.5 g/dL (ref 1.5–4.5)
Potassium: 4.7 mmol/L (ref 3.5–5.2)
Sodium: 141 mmol/L (ref 134–144)
TOTAL PROTEIN: 6.1 g/dL (ref 6.0–8.5)

## 2017-09-05 LAB — T4, FREE: FREE T4: 1.52 ng/dL (ref 0.82–1.77)

## 2017-09-05 LAB — TSH: TSH: 0.637 u[IU]/mL (ref 0.450–4.500)

## 2017-09-11 ENCOUNTER — Ambulatory Visit: Payer: Medicaid Other | Admitting: "Endocrinology

## 2017-09-11 ENCOUNTER — Encounter: Payer: Self-pay | Admitting: "Endocrinology

## 2017-09-11 VITALS — BP 127/86 | HR 80 | Ht 60.0 in | Wt 199.0 lb

## 2017-09-11 DIAGNOSIS — E1122 Type 2 diabetes mellitus with diabetic chronic kidney disease: Secondary | ICD-10-CM | POA: Insufficient documentation

## 2017-09-11 DIAGNOSIS — E785 Hyperlipidemia, unspecified: Secondary | ICD-10-CM | POA: Diagnosis not present

## 2017-09-11 DIAGNOSIS — I10 Essential (primary) hypertension: Secondary | ICD-10-CM

## 2017-09-11 DIAGNOSIS — E039 Hypothyroidism, unspecified: Secondary | ICD-10-CM | POA: Diagnosis not present

## 2017-09-11 DIAGNOSIS — E1165 Type 2 diabetes mellitus with hyperglycemia: Secondary | ICD-10-CM | POA: Diagnosis not present

## 2017-09-11 NOTE — Patient Instructions (Signed)

## 2017-09-11 NOTE — Progress Notes (Signed)
Endocrinology Consult Note       09/11/2017, 9:41 AM   Subjective:    Patient ID: Diane Morgan, female    DOB: [birth year].  Diane Morgan is being seen in consultation for management of currently uncontrolled symptomatic diabetes requested by  Elenora Gamma, MD.   Past Medical History:  Diagnosis Date  . Anemia   . Arthritis   . Cataract    right  . Cellulitis and abscess of trunk 11/28/2012  . Diabetic peripheral neuropathy (HCC)   . DM (diabetes mellitus) (HCC)   . Eczema   . GERD (gastroesophageal reflux disease)   . Hyperlipidemia   . Hypertension   . Hypothyroidism   . Leg pain, bilateral 10/08/09  . NIDDM (non-insulin dependent diabetes mellitus)   . URI (upper respiratory infection) 07/01/10   Past Surgical History:  Procedure Laterality Date  . CATARACT EXTRACTION W/PHACO Right 06/12/2017   Procedure: CATARACT EXTRACTION WITH PHACOEMULSIFICATION  AND INTRAOCULAR LENS PLACEMENT RIGHT EYE;  Surgeon: Gemma Payor, MD;  Location: [facility];  Service: Ophthalmology;  Laterality: Right;  CDE: 1.16  . CHOLECYSTECTOMY    . EYE SURGERY     right catracts  . LASER ABLATION CONDOLAMATA N/A 03/08/2017   Procedure: LASER ABLATION OF THE CERVIX;  Surgeon: Lazaro Arms, MD;  Location: [facility];  Service: Gynecology;  Laterality: N/A;  . WISDOM TOOTH EXTRACTION     Social History   Socioeconomic History  . Marital status: Single    Spouse name: Not on file  . Number of children: Not on file  . Years of education: Not on file  . Highest education level: Not on file  Occupational History  . Not on file  Social Needs  . Financial resource strain: Not on file  . Food insecurity:    Worry: Not on file    Inability: Not on file  . Transportation needs:    Medical: Not on file    Non-medical: Not on file  Tobacco Use  . Smoking status: Never Smoker  . Smokeless tobacco: Never Used   Substance and Sexual Activity  . Alcohol use: No  . Drug use: No  . Sexual activity: Not Currently    Birth control/protection: None  Lifestyle  . Physical activity:    Days per week: Not on file    Minutes per session: Not on file  . Stress: Not on file  Relationships  . Social connections:    Talks on phone: Not on file    Gets together: Not on file    Attends religious service: Not on file    Active member of club or organization: Not on file    Attends meetings of clubs or organizations: Not on file    Relationship status: Not on file  Other Topics Concern  . Not on file  Social History Narrative  . Not on file   Outpatient Encounter Medications as of 09/11/2017  Medication Sig  . aspirin 81 MG chewable tablet Chew 81 mg by mouth daily.    Marland Kitchen atorvastatin (LIPITOR) 40 MG tablet TAKE 40 MG BY MOUTH ONCE DAILY  .  Blood Glucose Monitoring Suppl (ACCU-CHEK AVIVA) device Use as instructed  . BYDUREON 2 MG PEN once a week.  . cetirizine (ZYRTEC) 10 MG tablet Take 10 mg by mouth at bedtime.   . cycloSPORINE (RESTASIS) 0.05 % ophthalmic emulsion 1 drop 2 (two) times daily.  . fenofibrate (TRICOR) 145 MG tablet TAKE 1 TABLET BY MOUTH ONCE DAILY  . gabapentin (NEURONTIN) 300 MG capsule 600 mg 2 (two) times daily.  Marland Kitchen glucose blood (ACCU-CHEK AVIVA) test strip Use to test glucose 4 times a day.  . ibuprofen (ADVIL,MOTRIN) 200 MG tablet Take 400 mg by mouth every 6 (six) hours as needed for headache or moderate pain.  Marland Kitchen levothyroxine (SYNTHROID, LEVOTHROID) 75 MCG tablet Take 1 tablet (75 mcg total) by mouth daily.  Marland Kitchen omeprazole (PRILOSEC) 40 MG capsule TAKE 1 CAPSULE BY MOUTH ONCE DAILY   No facility-administered encounter medications on file as of 09/11/2017.     ALLERGIES: Allergies  Allergen Reactions  . Penicillins Anaphylaxis, Hives and Other (See Comments)    Childhood Reaction. Has patient had a PCN reaction causing immediate rash, facial/tongue/throat swelling, SOB or  lightheadedness with hypotension: Yes Has patient had a PCN reaction causing severe rash involving mucus membranes or skin necrosis: No Has patient had a PCN reaction that required hospitalization: No Has patient had a PCN reaction occurring within the last 10 years: No If all of the above answers are "NO", then may proceed with Cephalosporin use.   . Niaspan [Niacin] Other (See Comments)    Patient passes out.    VACCINATION STATUS: Immunization History  Administered Date(s) Administered  . Influenza,inj,Quad PF,6+ Mos 11/28/2012, 01/22/2014, 12/26/2014, 12/07/2015, 12/15/2016  . Tdap 03/19/2013    Diabetes  She presents for her follow-up diabetic visit. She has type 2 diabetes mellitus. Onset time: She was diagnosed at approximate age of 47years. Her disease course has been improving. There are no hypoglycemic associated symptoms. Pertinent negatives for hypoglycemia include no confusion, headaches, pallor or seizures. Pertinent negatives for diabetes include no blurred vision, no chest pain, no fatigue, no polydipsia, no polyphagia and no polyuria. There are no hypoglycemic complications. Symptoms are improving. Diabetic complications include nephropathy. Risk factors for coronary artery disease include dyslipidemia, diabetes mellitus, family history, obesity, hypertension, sedentary lifestyle and tobacco exposure. Current diabetic treatments: She is on Jardiance 25 mg p.o. daily and Bydureon 2 mg [place]  weekly. Her weight is decreasing steadily. She is following a generally unhealthy diet. When asked about meal planning, she reported none. She has not had a previous visit with a dietitian. She rarely participates in exercise. Her overall blood glucose range is 140-180 mg/dl. (Patient came with significantly improved blood glucose profile.  Her most recent labs show A1c of 8.6% improving from 10.8%.   ) An ACE inhibitor/angiotensin II receptor blocker is not being taken. She does not see a  podiatrist.Eye exam is current (Status post cataract extraction.).  Hyperlipidemia  This is a chronic problem. The problem is uncontrolled. Recent lipid tests were reviewed and are variable. Exacerbating diseases include diabetes, hypothyroidism and obesity. Pertinent negatives include no chest pain, myalgias or shortness of breath. Current antihyperlipidemic treatment includes statins. Risk factors for coronary artery disease include diabetes mellitus, dyslipidemia, obesity, hypertension, a sedentary lifestyle and family history.  Hypertension  This is a chronic problem. The current episode started more than 1 year ago. The problem is controlled. Pertinent negatives include no blurred vision, chest pain, headaches, palpitations or shortness of breath. Risk factors for coronary artery  disease include dyslipidemia, diabetes mellitus, obesity, sedentary lifestyle and smoking/tobacco exposure. Hypertensive end-organ damage includes kidney disease.    Review of Systems  Constitutional: Negative for chills, fatigue, fever and unexpected weight change.  HENT: Negative for trouble swallowing and voice change.   Eyes: Negative for blurred vision and visual disturbance.  Respiratory: Negative for cough, shortness of breath and wheezing.   Cardiovascular: Negative for chest pain, palpitations and leg swelling.  Gastrointestinal: Negative for diarrhea, nausea and vomiting.  Endocrine: Negative for cold intolerance, heat intolerance, polydipsia, polyphagia and polyuria.  Musculoskeletal: Negative for arthralgias and myalgias.  Skin: Negative for color change, pallor, rash and wound.  Neurological: Negative for seizures and headaches.  Psychiatric/Behavioral: Negative for confusion and suicidal ideas.    Objective:    BP 127/86   Pulse 80   Ht 5' (1.524 m)   Wt 199 lb (90.3 kg)   BMI 38.86 kg/m   Wt Readings from Last 3 Encounters:  09/11/17 199 lb (90.3 kg)  08/07/17 201 lb (91.2 kg)  07/27/17  206 lb (93.4 kg)     Physical Exam  Constitutional: She is oriented to person, place, and time. She appears well-developed.  HENT:  Head: Normocephalic and atraumatic.  Poor dental condition.  Eyes: EOM are normal.  Neck: Normal range of motion. Neck supple. No tracheal deviation present. No thyromegaly present.  Cardiovascular: Normal rate.  Pulmonary/Chest: Effort normal.  Abdominal: There is no tenderness. There is no guarding.  Musculoskeletal: Normal range of motion. She exhibits no edema.  Neurological: She is alert and oriented to person, place, and time. She has normal reflexes. No cranial nerve deficit. Coordination (.1a) normal.  Skin: Skin is warm and dry. No rash noted. No erythema. No pallor.  Psychiatric: She has a normal mood and affect. Judgment normal.    CMP ( most recent) CMP     Component Value Date/Time   NA 141 09/04/2017 1110   K 4.7 09/04/2017 1110   CL 106 09/04/2017 1110   CO2 21 09/04/2017 1110   GLUCOSE 143 (H) 09/04/2017 1110   GLUCOSE 438 (H) 06/06/2017 0811   BUN 15 09/04/2017 1110   CREATININE 1.24 (H) 09/04/2017 1110   CREATININE 1.13 (H) 08/17/2012 1031   CALCIUM 9.3 09/04/2017 1110   PROT 6.1 09/04/2017 1110   ALBUMIN 3.6 09/04/2017 1110   AST 15 09/04/2017 1110   ALT 10 09/04/2017 1110   ALKPHOS 68 09/04/2017 1110   BILITOT 0.3 09/04/2017 1110   GFRNONAA 53 (L) 09/04/2017 1110   GFRNONAA 61 08/17/2012 1031   GFRAA 61 09/04/2017 1110   GFRAA 70 08/17/2012 1031     Diabetic Labs (most recent): Lab Results  Component Value Date   HGBA1C 8.6 (H) 09/04/2017   HGBA1C 10.8 (H) 06/06/2017   HGBA1C 7.8 (H) 03/03/2017     Lipid Panel ( most recent) Lipid Panel     Component Value Date/Time   CHOL 106 09/04/2017 1110   CHOL 85 08/17/2012 1031   TRIG 262 (H) 09/04/2017 1110   TRIG 161 (H) 08/17/2012 1031   HDL 26 (L) 09/04/2017 1110   HDL 26 (L) 08/17/2012 1031   CHOLHDL 4.1 09/04/2017 1110   CHOLHDL 10.3 11/29/2012 1030   VLDL  39 11/29/2012 1030   LDLCALC 28 09/04/2017 1110   LDLCALC 27 08/17/2012 1031      Lab Results  Component Value Date   TSH 0.637 09/04/2017   TSH 1.820 12/15/2016   TSH 1.760 12/07/2015   TSH  3.780 03/27/2015   TSH 4.950 (H) 11/14/2014   TSH 2.990 03/19/2013   TSH 2.602 11/28/2012   TSH 3.340 11/27/2012   FREET4 1.52 09/04/2017   FREET4 1.19 11/14/2014      Assessment & Plan:   1. Type 2 diabetes mellitus with stage 3 chronic kidney disease, without long-term current use of insulin (HCC)  - Diane Morgan has currently uncontrolled symptomatic type 2 DM since 47 years of age. -She came with near target blood glucose profile, A1c improving to 8.6% from 10.8%.  Her recent labs are reviewed showing improving renal function.    -her diabetes is complicated by obesity/sedentary life, smoking, and Diane Morgan remains at a high risk for more acute and chronic complications which include CAD, CVA, CKD, retinopathy, and neuropathy. These are all discussed in detail with the patient.  - I have counseled her on diet management and weight loss, by adopting a carbohydrate restricted/protein rich diet.  -  Suggestion is made for her to avoid simple carbohydrates  from her diet including Cakes, Sweet Desserts / Pastries, Ice Cream, Soda (diet and regular), Sweet Tea, Candies, Chips, Cookies, Store Bought Juices, Alcohol in Excess of  1-2 drinks a day, Artificial Sweeteners, and "Sugar-free" Products. This will help patient to have stable blood glucose profile and potentially avoid unintended weight gain.  - I encouraged her to switch to  unprocessed or minimally processed complex starch and increased protein intake (animal or plant source), fruits, and vegetables.  - she is advised to stick to a routine mealtimes to eat 3 meals  a day and avoid unnecessary snacks ( to snack only to correct hypoglycemia).   - she has been scheduled with Norm SaltPenny Crumpton, RDN, CDE for individualized diabetes  education.  - I have approached her with the following individualized plan to manage diabetes and patient agrees:   -Based on her presentation with near target blood glucose profile and improving A1c of 8.6% from 10.8%, she will not require insulin treatment at this time.  -Patient seems to have significant cognitive deficit, maximizing non-insulin therapeutic options would be entertained before considering insulin treatment.  -She is not a good candidate for full dose metformin. -She will continue to benefit from Bydureon.  I advised her to continue Bydureon 2 mg subcutaneously weekly.  -If her renal function continues to improve, she will be considered for low-dose metformin on subsequent visits. -Patient is encouraged to call clinic for blood glucose levels less than 70 or above 200 mg /dl.  - Patient specific target  A1c;  LDL, HDL, Triglycerides, and  Waist Circumference were discussed in detail.  2) BP/HTN: Her blood pressure is controlled to target.    She is not on any antihypertensive medications.    3) Lipids/HPL:   Her recent lipid panel showed hypertriglyceridemia and controlled LDL at 28.  I would proceed to lower her atorvastatin to 20 mg p.o. nightly.   4)  Weight/Diet: CDE Consult has been  initiated , exercise, and detailed carbohydrates information provided.  5) hypothyroidism: -Diagnosed at approximate age of 42 years. -Her current dose of levothyroxine at 75 mcg is consistent with appropriate replacement.     - We discussed about correct intake of levothyroxine, at fasting, with water, separated by at least 30 minutes from breakfast, and separated by more than 4 hours from calcium, iron, multivitamins, acid reflux medications (PPIs). -Patient is made aware of the fact that thyroid hormone replacement is needed for life, dose to be adjusted  by periodic monitoring of thyroid function tests.  6) Chronic Care/Health Maintenance:  -she  is on  Statin medications and  is  encouraged to continue to follow up with Ophthalmology, Dentist,  Podiatrist at least yearly or according to recommendations, and advised to  Quit smoking. I have recommended yearly flu vaccine and pneumonia vaccination at least every 5 years; moderate intensity exercise for up to 150 minutes weekly; and  sleep for at least 7 hours a day.  - I advised patient to maintain close follow up with Elenora Gamma, MD for primary care needs.  - Time spent with the patient: 25 min, of which >50% was spent in reviewing her blood glucose logs , discussing her hypo- and hyper-glycemic episodes, reviewing her current and  previous labs and insulin doses and developing a plan to avoid hypo- and hyper-glycemia. Please refer to Patient Instructions for Blood Glucose Monitoring and Insulin/Medications Dosing Guide"  in media tab for additional information. Diane Morgan participated in the discussions, expressed understanding, and voiced agreement with the above plans.  All questions were answered to her satisfaction. she is encouraged to contact clinic should she have any questions or concerns prior to her return visit.  Follow up plan: - Return in about 4 months (around 01/11/2018) for follow up with pre-visit labs.  Marquis Lunch, MD [facility] [address] [place], [place] [address] Phone: [phone]  Fax: [phone]    09/11/2017, 9:41 AM  This note was partially dictated with voice recognition software. Similar sounding words can be transcribed inadequately or may not  be corrected upon review.

## 2017-09-20 ENCOUNTER — Other Ambulatory Visit: Payer: Self-pay | Admitting: "Endocrinology

## 2017-09-20 DIAGNOSIS — E1165 Type 2 diabetes mellitus with hyperglycemia: Secondary | ICD-10-CM

## 2017-09-20 DIAGNOSIS — E039 Hypothyroidism, unspecified: Secondary | ICD-10-CM

## 2017-10-12 ENCOUNTER — Encounter: Payer: Medicaid Other | Attending: Family Medicine | Admitting: Nutrition

## 2017-10-12 VITALS — Ht 60.0 in | Wt 195.0 lb

## 2017-10-12 DIAGNOSIS — E118 Type 2 diabetes mellitus with unspecified complications: Secondary | ICD-10-CM

## 2017-10-12 DIAGNOSIS — E119 Type 2 diabetes mellitus without complications: Secondary | ICD-10-CM | POA: Diagnosis not present

## 2017-10-12 DIAGNOSIS — IMO0002 Reserved for concepts with insufficient information to code with codable children: Secondary | ICD-10-CM

## 2017-10-12 DIAGNOSIS — E1165 Type 2 diabetes mellitus with hyperglycemia: Secondary | ICD-10-CM

## 2017-10-12 DIAGNOSIS — Z713 Dietary counseling and surveillance: Secondary | ICD-10-CM | POA: Diagnosis not present

## 2017-10-12 NOTE — Patient Instructions (Signed)
Goals 1. Follow My Plate 2. Cut out honey 3. Eat 2-3 carb choices per meal 4. Eat fruit with meals. Keep drinking water  Keep exercising Get A1C to 7%. Lose 1-2 lbs per eek.

## 2017-10-12 NOTE — Progress Notes (Signed)
Diabetes Self-Management Education  Visit Type: First/Initial  Appt. Start Time: 0830 ppt. End Time: 0930  10/12/2017  [name], identified by name and date of birth, is a 47 y.o. female with a diagnosis of Diabetes: Type 2.  Here with her mom. Sees Dr. Fransico Him, Endocrinology. Has lost 80+ lbs in the last year. Has been working on portion sizes, meal planning and being more active. Currently on Bydureon only. FBS 130's . BMI 38. Limited reading abiliy. Engaged to make furter changes with eating and exercise to better manage her DM. ASSESSMENT  Height 5' (1.524 m), weight 195 lb (88.5 kg). Body mass index is 38.08 kg/m.  Lab Results  Component Value Date   HGBA1C 8.6 (H) 09/04/2017   CMP Latest Ref Rng & Units 09/04/2017 06/06/2017 03/23/2017  Glucose 65 - 99 mg/dL 161(W) 960(A) 540(J)  BUN 6 - 24 mg/dL 15 14 13   Creatinine 0.57 - 1.00 mg/dL 8.11(B) 1.47(W) 2.95(A)  Sodium 134 - 144 mmol/L 141 130(L) 137  Potassium 3.5 - 5.2 mmol/L 4.7 4.3 4.2  Chloride 96 - 106 mmol/L 106 98(L) 103  CO2 20 - 29 mmol/L 21 22 21   Calcium 8.7 - 10.2 mg/dL 9.3 2.1(H) 8.9  Total Protein 6.0 - 8.5 g/dL 6.1 - 6.1  Total Bilirubin 0.0 - 1.2 mg/dL 0.3 - 0.2  Alkaline Phos 39 - 117 IU/L 68 - 74  AST 0 - 40 IU/L 15 - 12  ALT 0 - 32 IU/L 10 - 9   Lipid Panel     Component Value Date/Time   CHOL 106 09/04/2017 1110   CHOL 85 08/17/2012 1031   TRIG 262 (H) 09/04/2017 1110   TRIG 161 (H) 08/17/2012 1031   HDL 26 (L) 09/04/2017 1110   HDL 26 (L) 08/17/2012 1031   CHOLHDL 4.1 09/04/2017 1110   CHOLHDL 10.3 11/29/2012 1030   VLDL 39 11/29/2012 1030   LDLCALC 28 09/04/2017 1110   LDLCALC 27 08/17/2012 1031        Diabetes Self-Management Education - 10/12/17 1025      Visit Information   Visit Type  First/Initial      Initial Visit   Diabetes Type  Type 2    Are you currently following a meal plan?  No    Are you taking your medications as prescribed?  Yes    Date Diagnosed  2000      Health Coping   How would you rate your overall health?  Good      Psychosocial Assessment   Patient Belief/Attitude about Diabetes  Motivated to manage diabetes    Self-care barriers  None    Self-management support  Family    Other persons present  Patient;Family Member    Patient Concerns  Nutrition/Meal planning;Weight Control;Healthy Lifestyle    Special Needs  Simplified materials    Preferred Learning Style  Visual;Auditory;Hands on    Learning Readiness  Change in progress    How often do you need to have someone help you when you read instructions, pamphlets, or other written materials from your doctor or pharmacy?  4 - Often    What is the last grade level you completed in school?  12      Pre-Education Assessment   Patient understands the diabetes disease and treatment process.  Needs Review    Patient understands incorporating nutritional management into lifestyle.  Needs Review    Patient undertands incorporating physical activity into lifestyle.  Needs Review    Patient  understands using medications safely.  Needs Review    Patient understands monitoring blood glucose, interpreting and using results  Needs Review    Patient understands prevention, detection, and treatment of acute complications.  Needs Review    Patient understands prevention, detection, and treatment of chronic complications.  Needs Review    Patient understands how to develop strategies to address psychosocial issues.  Needs Review    Patient understands how to develop strategies to promote health/change behavior.  Needs Review      Complications   Last HgB A1C per patient/outside source  8.8 %    How often do you check your blood sugar?  1-2 times/day    Fasting Blood glucose range (mg/dL)  [phone]    Postprandial Blood glucose range (mg/dL)  [phone]    Number of hyperglycemic episodes per week  0    Can you tell when your blood sugar is high?  Yes    What do you do if your blood sugar is high?   drink water    Have you had a dilated eye exam in the past 12 months?  Yes    Have you had a dental exam in the past 12 months?  Yes    Are you checking your feet?  Yes    How many days per week are you checking your feet?  7      Dietary Intake   Breakfast  Oatmeal, water    Lunch  pb and honey on bread, water    Dinner  salad, chicken, water    Beverage(s)  water      Exercise   Exercise Type  Light (walking / raking leaves)    How many days per week to you exercise?  30    How many minutes per day do you exercise?  5    Total minutes per week of exercise  150      Patient Education   Disease state   Explored patient's options for treatment of their diabetes    Nutrition management   Role of diet in the treatment of diabetes and the relationship between the three main macronutrients and blood glucose level;Carbohydrate counting;Meal timing in regards to the patients' current diabetes medication.;Reviewed blood glucose goals for pre and post meals and how to evaluate the patients' food intake on their blood glucose level.    Physical activity and exercise   Role of exercise on diabetes management, blood pressure control and cardiac health.;Helped patient identify appropriate exercises in relation to his/her diabetes, diabetes complications and other health issue.    Medications  Reviewed patients medication for diabetes, action, purpose, timing of dose and side effects.    Monitoring  Purpose and frequency of SMBG.;Taught/discussed recording of test results and interpretation of SMBG.;Identified appropriate SMBG and/or A1C goals.;Daily foot exams;Interpreting lab values - A1C, lipid, urine microalbumina.    Acute complications  Taught treatment of hypoglycemia - the 15 rule.    Chronic complications  Assessed and discussed foot care and prevention of foot problems;Lipid levels, blood glucose control and heart disease;Dental care;Identified and discussed with patient  current chronic  complications    Personal strategies to promote health  Lifestyle issues that need to be addressed for better diabetes care      Individualized Goals (developed by patient)   Nutrition  Follow meal plan discussed    Physical Activity  Exercise 5-7 days per week;30 minutes per day    Medications  take my medication as prescribed  Monitoring   test my blood glucose as discussed      Post-Education Assessment   Patient understands the diabetes disease and treatment process.  Needs Review    Patient understands incorporating nutritional management into lifestyle.  Needs Review    Patient undertands incorporating physical activity into lifestyle.  Needs Review    Patient understands using medications safely.  Needs Review    Patient understands monitoring blood glucose, interpreting and using results  Needs Review    Patient understands prevention, detection, and treatment of acute complications.  Needs Review    Patient understands prevention, detection, and treatment of chronic complications.  Needs Review    Patient understands how to develop strategies to address psychosocial issues.  Needs Review    Patient understands how to develop strategies to promote health/change behavior.  Needs Review      Outcomes   Expected Outcomes  Demonstrated interest in learning. Expect positive outcomes    Future DMSE  3-4 months    Program Status  Completed       Individualized Plan for Diabetes Self-Management Training:   Learning Objective:  Patient will have a greater understanding of diabetes self-management. Patient education plan is to attend individual and/or group sessions per assessed needs and concerns.   Plan:   Patient Instructions  Goals 1. Follow My Plate 2. Cut out honey 3. Eat 2-3 carb choices per meal 4. Eat fruit with meals. Keep drinking water  Keep exercising Get A1C to 7%. Lose 1-2 lbs per eek.    Expected Outcomes:  Demonstrated interest in learning. Expect  positive outcomes  Education material provided: ADA Diabetes: Your Take Control Guide, A1C conversion sheet, Meal plan card, My Plate and Carbohydrate counting sheet  If problems or questions, patient to contact team via:  Phone and Email  Future DSME appointment: 3-4 months

## 2017-10-16 ENCOUNTER — Encounter: Payer: Self-pay | Admitting: Nutrition

## 2017-10-19 ENCOUNTER — Encounter: Payer: Self-pay | Admitting: Obstetrics & Gynecology

## 2017-10-19 ENCOUNTER — Ambulatory Visit: Payer: Medicaid Other | Admitting: Obstetrics & Gynecology

## 2017-10-19 ENCOUNTER — Other Ambulatory Visit (HOSPITAL_COMMUNITY)
Admission: RE | Admit: 2017-10-19 | Discharge: 2017-10-19 | Disposition: A | Discharge status: Home / Self Care | Payer: Medicaid Other | Source: Ambulatory Visit | Attending: Obstetrics & Gynecology | Admitting: Obstetrics & Gynecology

## 2017-10-19 VITALS — BP 128/80 | HR 86 | Ht 60.0 in | Wt 190.0 lb

## 2017-10-19 DIAGNOSIS — Z01419 Encounter for gynecological examination (general) (routine) without abnormal findings: Secondary | ICD-10-CM

## 2017-10-19 DIAGNOSIS — Z9889 Other specified postprocedural states: Secondary | ICD-10-CM | POA: Diagnosis not present

## 2017-10-19 NOTE — Progress Notes (Signed)
Chief Complaint  Patient presents with  . Follow-up    repeat pap       47 y.o. G0P0000 Patient's last menstrual period was 10/06/2017 (exact date). The current method of family planning is none.  Outpatient Encounter Medications as of 10/19/2017  Medication Sig  . aspirin 81 MG chewable tablet Chew 81 mg by mouth daily.    Marland Kitchen atorvastatin (LIPITOR) 40 MG tablet TAKE 40 MG BY MOUTH ONCE DAILY  . Blood Glucose Monitoring Suppl (ACCU-CHEK AVIVA) device Use as instructed  . BYDUREON 2 MG PEN once a week.  . cetirizine (ZYRTEC) 10 MG tablet Take 10 mg by mouth at bedtime.   . cycloSPORINE (RESTASIS) 0.05 % ophthalmic emulsion 1 drop 2 (two) times daily.  . fenofibrate (TRICOR) 145 MG tablet TAKE 1 TABLET BY MOUTH ONCE DAILY  . gabapentin (NEURONTIN) 300 MG capsule 600 mg 2 (two) times daily.  Marland Kitchen glucose blood (ACCU-CHEK AVIVA) test strip Use to test glucose 4 times a day.  . ibuprofen (ADVIL,MOTRIN) 200 MG tablet Take 400 mg by mouth every 6 (six) hours as needed for headache or moderate pain.  Marland Kitchen levothyroxine (SYNTHROID, LEVOTHROID) 75 MCG tablet Take 1 tablet (75 mcg total) by mouth daily.  Marland Kitchen omeprazole (PRILOSEC) 40 MG capsule TAKE 1 CAPSULE BY MOUTH ONCE DAILY   No facility-administered encounter medications on file as of 10/19/2017.     Subjective Diane Morgan is 7 months s/p laser conization for HSIL No complaints here for follow up Pap Past Medical History:  Diagnosis Date  . Anemia   . Arthritis   . Cataract    right  . Cellulitis and abscess of trunk 11/28/2012  . Diabetic peripheral neuropathy (HCC)   . DM (diabetes mellitus) (HCC)   . Eczema   . GERD (gastroesophageal reflux disease)   . Hyperlipidemia   . Hypertension   . Hypothyroidism   . Leg pain, bilateral 10/08/09  . NIDDM (non-insulin dependent diabetes mellitus)   . URI (upper respiratory infection) 07/01/10    Past Surgical History:  Procedure Laterality Date  . CATARACT EXTRACTION W/PHACO  Right 06/12/2017   Procedure: CATARACT EXTRACTION WITH PHACOEMULSIFICATION  AND INTRAOCULAR LENS PLACEMENT RIGHT EYE;  Surgeon: Gemma Payor, MD;  Location: [facility];  Service: Ophthalmology;  Laterality: Right;  CDE: 1.16  . CHOLECYSTECTOMY    . EYE SURGERY     right catracts  . LASER ABLATION CONDOLAMATA N/A 03/08/2017   Procedure: LASER ABLATION OF THE CERVIX;  Surgeon: Lazaro Arms, MD;  Location: [facility];  Service: Gynecology;  Laterality: N/A;  . WISDOM TOOTH EXTRACTION      OB History    Gravida  0   Para  0   Term  0   Preterm  0   AB  0   Living  0     SAB  0   TAB  0   Ectopic  0   Multiple  0   Live Births  0           Allergies  Allergen Reactions  . Penicillins Anaphylaxis, Hives and Other (See Comments)    Childhood Reaction. Has patient had a PCN reaction causing immediate rash, facial/tongue/throat swelling, SOB or lightheadedness with hypotension: Yes Has patient had a PCN reaction causing severe rash involving mucus membranes or skin necrosis: No Has patient had a PCN reaction that required hospitalization: No Has patient had a PCN reaction occurring within the last 10 years:  No If all of the above answers are "NO", then may proceed with Cephalosporin use.   . Niaspan [Niacin] Other (See Comments)    Patient passes out.    Social History   Socioeconomic History  . Marital status: Single    Spouse name: Not on file  . Number of children: Not on file  . Years of education: Not on file  . Highest education level: Not on file  Occupational History  . Not on file  Social Needs  . Financial resource strain: Not on file  . Food insecurity:    Worry: Not on file    Inability: Not on file  . Transportation needs:    Medical: Not on file    Non-medical: Not on file  Tobacco Use  . Smoking status: Never Smoker  . Smokeless tobacco: Never Used  Substance and Sexual Activity  . Alcohol use: No  . Drug use: No  . Sexual activity: Not  Currently    Birth control/protection: None  Lifestyle  . Physical activity:    Days per week: Not on file    Minutes per session: Not on file  . Stress: Not on file  Relationships  . Social connections:    Talks on phone: Not on file    Gets together: Not on file    Attends religious service: Not on file    Active member of club or organization: Not on file    Attends meetings of clubs or organizations: Not on file    Relationship status: Not on file  Other Topics Concern  . Not on file  Social History Narrative  . Not on file    Family History  Problem Relation Age of Onset  . Diabetes Father   . Hypertension Mother   . Hyperlipidemia Mother   . Heart disease Maternal Grandmother   . Diabetes Paternal Grandmother   . Diabetes Paternal Grandfather     Medications:       Current Outpatient Medications:  .  aspirin 81 MG chewable tablet, Chew 81 mg by mouth daily.  , Disp: , Rfl:  .  atorvastatin (LIPITOR) 40 MG tablet, TAKE 40 MG BY MOUTH ONCE DAILY, Disp: 90 tablet, Rfl: 3 .  Blood Glucose Monitoring Suppl (ACCU-CHEK AVIVA) device, Use as instructed, Disp: 1 each, Rfl: 0 .  BYDUREON 2 MG PEN, once a week., Disp: , Rfl: 11 .  cetirizine (ZYRTEC) 10 MG tablet, Take 10 mg by mouth at bedtime. , Disp: , Rfl:  .  cycloSPORINE (RESTASIS) 0.05 % ophthalmic emulsion, 1 drop 2 (two) times daily., Disp: , Rfl:  .  fenofibrate (TRICOR) 145 MG tablet, TAKE 1 TABLET BY MOUTH ONCE DAILY, Disp: 90 tablet, Rfl: 1 .  gabapentin (NEURONTIN) 300 MG capsule, 600 mg 2 (two) times daily., Disp: , Rfl: 0 .  glucose blood (ACCU-CHEK AVIVA) test strip, Use to test glucose 4 times a day., Disp: 150 each, Rfl: 3 .  ibuprofen (ADVIL,MOTRIN) 200 MG tablet, Take 400 mg by mouth every 6 (six) hours as needed for headache or moderate pain., Disp: , Rfl:  .  levothyroxine (SYNTHROID, LEVOTHROID) 75 MCG tablet, Take 1 tablet (75 mcg total) by mouth daily., Disp: 30 tablet, Rfl: 6 .  omeprazole (PRILOSEC)  40 MG capsule, TAKE 1 CAPSULE BY MOUTH ONCE DAILY, Disp: 90 capsule, Rfl: 1  Objective Blood pressure 128/80, pulse 86, height 5' (1.524 m), weight 190 lb (86.2 kg), last menstrual period 10/06/2017.  Gen WDWN NAD vuvla  normal no lesions or masses Vagina normal no lesions Cervix well healed no lesions Pap performed  Pertinent ROS No burning with urination, frequency or urgency No nausea, vomiting or diarrhea Nor fever chills or other constitutional symptoms   Labs or studies     Impression Diagnoses this Encounter::   ICD-10-CM   1. Encounter for gynecological examination with Papanicolaou smear of cervix Z01.419 Cytology - PAP  2. 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   Labs or Scans Ordered: No orders of the defined types were placed in this encounter.   Management:: Follow up Pap every 6 months for 2 years  Follow up Return in about 6 months (around 04/21/2018) for Pap , with Dr Despina HiddenEure.      All questions were answered.

## 2017-10-23 LAB — CYTOLOGY - PAP
DIAGNOSIS: REACTIVE
Diagnosis: NEGATIVE
HPV (WINDOPATH): NOT DETECTED

## 2017-11-16 DIAGNOSIS — H5213 Myopia, bilateral: Secondary | ICD-10-CM | POA: Diagnosis not present

## 2017-11-30 ENCOUNTER — Other Ambulatory Visit: Payer: Self-pay | Admitting: Family Medicine

## 2017-11-30 NOTE — Telephone Encounter (Signed)
What is the name of the medication? gabapentin (NEURONTIN) 300 MG capsule  Have you contacted your pharmacy to request a refill? yes  Which pharmacy would you like this sent to? [facility]   Patient notified that their request is being sent to the clinical staff for review and that they should receive a call once it is complete. If they do not receive a call within 24 hours they can check with their pharmacy or our office.

## 2017-12-01 MED ORDER — GABAPENTIN 300 MG PO CAPS: 600.0000 mg | ORAL_CAPSULE | Freq: Two times a day (BID) | ORAL | 0 refills | Status: DC

## 2018-01-02 ENCOUNTER — Other Ambulatory Visit: Payer: Self-pay | Admitting: "Endocrinology

## 2018-01-02 ENCOUNTER — Other Ambulatory Visit (INDEPENDENT_AMBULATORY_CARE_PROVIDER_SITE_OTHER): Payer: Medicaid Other | Admitting: *Deleted

## 2018-01-02 ENCOUNTER — Ambulatory Visit: Payer: Medicaid Other

## 2018-01-02 DIAGNOSIS — E1165 Type 2 diabetes mellitus with hyperglycemia: Secondary | ICD-10-CM

## 2018-01-02 DIAGNOSIS — Z23 Encounter for immunization: Secondary | ICD-10-CM | POA: Diagnosis not present

## 2018-01-02 DIAGNOSIS — E039 Hypothyroidism, unspecified: Secondary | ICD-10-CM

## 2018-01-03 LAB — COMPREHENSIVE METABOLIC PANEL
ALK PHOS: 63 IU/L (ref 39–117)
ALT: 16 IU/L (ref 0–32)
AST: 13 IU/L (ref 0–40)
Albumin/Globulin Ratio: 1.6 (ref 1.2–2.2)
Albumin: 3.7 g/dL (ref 3.5–5.5)
BILIRUBIN TOTAL: 0.3 mg/dL (ref 0.0–1.2)
BUN/Creatinine Ratio: 14 (ref 9–23)
BUN: 18 mg/dL (ref 6–24)
CHLORIDE: 105 mmol/L (ref 96–106)
CO2: 22 mmol/L (ref 20–29)
Calcium: 8.9 mg/dL (ref 8.7–10.2)
Creatinine, Ser: 1.26 mg/dL — ABNORMAL HIGH (ref 0.57–1.00)
GFR calc non Af Amer: 52 mL/min/{1.73_m2} — ABNORMAL LOW (ref >59)
GFR, EST AFRICAN AMERICAN: 59 mL/min/{1.73_m2} — AB (ref >59)
GLUCOSE: 141 mg/dL — AB (ref 65–99)
Globulin, Total: 2.3 g/dL (ref 1.5–4.5)
Potassium: 4.2 mmol/L (ref 3.5–5.2)
Sodium: 141 mmol/L (ref 134–144)
TOTAL PROTEIN: 6 g/dL (ref 6.0–8.5)

## 2018-01-03 LAB — HEMOGLOBIN A1C
Est. average glucose Bld gHb Est-mCnc: 137 mg/dL
HEMOGLOBIN A1C: 6.4 % — AB (ref 4.8–5.6)

## 2018-01-03 LAB — T4, FREE: FREE T4: 1.68 ng/dL (ref 0.82–1.77)

## 2018-01-03 LAB — TSH: TSH: 0.346 u[IU]/mL — AB (ref 0.450–4.500)

## 2018-01-11 ENCOUNTER — Ambulatory Visit (INDEPENDENT_AMBULATORY_CARE_PROVIDER_SITE_OTHER): Payer: Medicaid Other | Admitting: "Endocrinology

## 2018-01-11 ENCOUNTER — Encounter: Payer: Self-pay | Admitting: Nutrition

## 2018-01-11 ENCOUNTER — Encounter: Payer: Medicaid Other | Attending: Family Medicine | Admitting: Nutrition

## 2018-01-11 ENCOUNTER — Encounter: Payer: Self-pay | Admitting: "Endocrinology

## 2018-01-11 VITALS — Ht 60.0 in | Wt 182.0 lb

## 2018-01-11 VITALS — BP 136/86 | HR 78 | Ht 60.0 in | Wt 183.0 lb

## 2018-01-11 DIAGNOSIS — Z713 Dietary counseling and surveillance: Secondary | ICD-10-CM | POA: Diagnosis not present

## 2018-01-11 DIAGNOSIS — N183 Chronic kidney disease, stage 3 (moderate): Secondary | ICD-10-CM | POA: Diagnosis not present

## 2018-01-11 DIAGNOSIS — E1165 Type 2 diabetes mellitus with hyperglycemia: Secondary | ICD-10-CM | POA: Diagnosis not present

## 2018-01-11 DIAGNOSIS — E1122 Type 2 diabetes mellitus with diabetic chronic kidney disease: Secondary | ICD-10-CM | POA: Insufficient documentation

## 2018-01-11 DIAGNOSIS — E785 Hyperlipidemia, unspecified: Secondary | ICD-10-CM | POA: Diagnosis not present

## 2018-01-11 DIAGNOSIS — E119 Type 2 diabetes mellitus without complications: Secondary | ICD-10-CM

## 2018-01-11 DIAGNOSIS — E039 Hypothyroidism, unspecified: Secondary | ICD-10-CM

## 2018-01-11 DIAGNOSIS — I10 Essential (primary) hypertension: Secondary | ICD-10-CM | POA: Diagnosis not present

## 2018-01-11 NOTE — Patient Instructions (Signed)

## 2018-01-11 NOTE — Patient Instructions (Addendum)
Goals 1. Keep up the great job!! Work out  3-4 times per week on treadmill, bike.  Lose 2-3 lbs per month Keep A1C to 6.5% or less.

## 2018-01-11 NOTE — Progress Notes (Signed)
Endocrinology Consult Note       01/11/2018, 9:17 AM   Subjective:    Patient ID: Diane Morgan, female    DOB: December 19, 1970.  Diane Morgan is being seen in consultation for management of currently uncontrolled symptomatic diabetes requested by  Dettinger, Elige Radon, MD.   Past Medical History:  Diagnosis Date  . Anemia   . Arthritis   . Cataract    right  . Cellulitis and abscess of trunk 11/28/2012  . Diabetic peripheral neuropathy (HCC)   . DM (diabetes mellitus) (HCC)   . Eczema   . GERD (gastroesophageal reflux disease)   . Hyperlipidemia   . Hypertension   . Hypothyroidism   . Leg pain, bilateral 10/08/09  . NIDDM (non-insulin dependent diabetes mellitus)   . URI (upper respiratory infection) 07/01/10   Past Surgical History:  Procedure Laterality Date  . CATARACT EXTRACTION W/PHACO Right 06/12/2017   Procedure: CATARACT EXTRACTION WITH PHACOEMULSIFICATION  AND INTRAOCULAR LENS PLACEMENT RIGHT EYE;  Surgeon: Gemma Payor, MD;  Location: [facility];  Service: Ophthalmology;  Laterality: Right;  CDE: 1.16  . CHOLECYSTECTOMY    . EYE SURGERY     right catracts  . LASER ABLATION CONDOLAMATA N/A 03/08/2017   Procedure: LASER ABLATION OF THE CERVIX;  Surgeon: Lazaro Arms, MD;  Location: [facility];  Service: Gynecology;  Laterality: N/A;  . WISDOM TOOTH EXTRACTION     Social History   Socioeconomic History  . Marital status: Single    Spouse name: Not on file  . Number of children: Not on file  . Years of education: Not on file  . Highest education level: Not on file  Occupational History  . Not on file  Social Needs  . Financial resource strain: Not on file  . Food insecurity:    Worry: Not on file    Inability: Not on file  . Transportation needs:    Medical: Not on file    Non-medical: Not on file  Tobacco Use  . Smoking status: Never Smoker  . Smokeless tobacco: Never Used   Substance and Sexual Activity  . Alcohol use: No  . Drug use: No  . Sexual activity: Not Currently    Birth control/protection: None  Lifestyle  . Physical activity:    Days per week: Not on file    Minutes per session: Not on file  . Stress: Not on file  Relationships  . Social connections:    Talks on phone: Not on file    Gets together: Not on file    Attends religious service: Not on file    Active member of club or organization: Not on file    Attends meetings of clubs or organizations: Not on file    Relationship status: Not on file  Other Topics Concern  . Not on file  Social History Narrative  . Not on file   Outpatient Encounter Medications as of 01/11/2018  Medication Sig  . aspirin 81 MG chewable tablet Chew 81 mg by mouth daily.    Marland Kitchen atorvastatin (LIPITOR) 40 MG tablet TAKE 40 MG BY MOUTH ONCE DAILY  .  Blood Glucose Monitoring Suppl (ACCU-CHEK AVIVA) device Use as instructed  . BYDUREON 2 MG PEN once a week.  . cetirizine (ZYRTEC) 10 MG tablet Take 10 mg by mouth at bedtime.   . cycloSPORINE (RESTASIS) 0.05 % ophthalmic emulsion 1 drop 2 (two) times daily.  . fenofibrate (TRICOR) 145 MG tablet TAKE 1 TABLET BY MOUTH ONCE DAILY  . gabapentin (NEURONTIN) 300 MG capsule Take 2 capsules (600 mg total) by mouth 2 (two) times daily.  Marland Kitchen glucose blood (ACCU-CHEK AVIVA) test strip Use to test glucose 4 times a day.  . ibuprofen (ADVIL,MOTRIN) 200 MG tablet Take 400 mg by mouth every 6 (six) hours as needed for headache or moderate pain.  Marland Kitchen levothyroxine (SYNTHROID, LEVOTHROID) 75 MCG tablet Take 1 tablet (75 mcg total) by mouth daily.  Marland Kitchen omeprazole (PRILOSEC) 40 MG capsule TAKE 1 CAPSULE BY MOUTH ONCE DAILY   No facility-administered encounter medications on file as of 01/11/2018.     ALLERGIES: Allergies  Allergen Reactions  . Penicillins Anaphylaxis, Hives and Other (See Comments)    Childhood Reaction. Has patient had a PCN reaction causing immediate rash,  facial/tongue/throat swelling, SOB or lightheadedness with hypotension: Yes Has patient had a PCN reaction causing severe rash involving mucus membranes or skin necrosis: No Has patient had a PCN reaction that required hospitalization: No Has patient had a PCN reaction occurring within the last 10 years: No If all of the above answers are "NO", then may proceed with Cephalosporin use.   . Niaspan [Niacin] Other (See Comments)    Patient passes out.    VACCINATION STATUS: Immunization History  Administered Date(s) Administered  . Influenza,inj,Quad PF,6+ Mos 11/28/2012, 01/22/2014, 12/26/2014, 12/07/2015, 12/15/2016, 01/02/2018  . Tdap 03/19/2013    Diabetes  She presents for her follow-up diabetic visit. She has type 2 diabetes mellitus. Onset time: She was diagnosed at approximate age of 47years. Her disease course has been improving. There are no hypoglycemic associated symptoms. Pertinent negatives for hypoglycemia include no confusion, headaches, pallor or seizures. Pertinent negatives for diabetes include no blurred vision, no chest pain, no fatigue, no polydipsia, no polyphagia and no polyuria. There are no hypoglycemic complications. Symptoms are improving. Diabetic complications include nephropathy. Risk factors for coronary artery disease include dyslipidemia, diabetes mellitus, family history, obesity, hypertension, sedentary lifestyle and tobacco exposure. Current diabetic treatments: She is on Jardiance 25 mg p.o. daily and Bydureon 2 mg Martin  weekly. Her weight is stable. She is following a generally unhealthy diet. When asked about meal planning, she reported none. She has not had a previous visit with a dietitian. She rarely participates in exercise. Her overall blood glucose range is 130-140 mg/dl. An ACE inhibitor/angiotensin II receptor blocker is not being taken. She does not see a podiatrist.Eye exam is current (Status post cataract extraction.).  Hyperlipidemia  This is a  chronic problem. The problem is uncontrolled. Recent lipid tests were reviewed and are variable. Exacerbating diseases include diabetes, hypothyroidism and obesity. Pertinent negatives include no chest pain, myalgias or shortness of breath. Current antihyperlipidemic treatment includes statins. Risk factors for coronary artery disease include diabetes mellitus, dyslipidemia, obesity, hypertension, a sedentary lifestyle and family history.  Hypertension  This is a chronic problem. The current episode started more than 1 year ago. The problem is controlled. Pertinent negatives include no blurred vision, chest pain, headaches, palpitations or shortness of breath. Risk factors for coronary artery disease include dyslipidemia, diabetes mellitus, obesity, sedentary lifestyle and smoking/tobacco exposure. Hypertensive end-organ damage includes kidney disease.  Review of Systems  Constitutional: Negative for chills, fatigue, fever and unexpected weight change.  HENT: Negative for trouble swallowing and voice change.   Eyes: Negative for blurred vision and visual disturbance.  Respiratory: Negative for cough, shortness of breath and wheezing.   Cardiovascular: Negative for chest pain, palpitations and leg swelling.  Gastrointestinal: Negative for diarrhea, nausea and vomiting.  Endocrine: Negative for cold intolerance, heat intolerance, polydipsia, polyphagia and polyuria.  Musculoskeletal: Negative for arthralgias and myalgias.  Skin: Negative for color change, pallor, rash and wound.  Neurological: Negative for seizures and headaches.  Psychiatric/Behavioral: Negative for confusion and suicidal ideas.    Objective:    BP 136/86   Pulse 78   Ht 5' (1.524 m)   Wt 183 lb (83 kg)   BMI 35.74 kg/m   Wt Readings from Last 3 Encounters:  01/11/18 182 lb (82.6 kg)  01/11/18 183 lb (83 kg)  10/19/17 190 lb (86.2 kg)     Physical Exam  Constitutional: She is oriented to person, place, and time.  She appears well-developed.  HENT:  Head: Normocephalic and atraumatic.  Poor dental condition.  Eyes: EOM are normal.  Neck: Normal range of motion. Neck supple. No tracheal deviation present. No thyromegaly present.  Cardiovascular: Normal rate.  Pulmonary/Chest: Effort normal.  Abdominal: There is no tenderness. There is no guarding.  Musculoskeletal: Normal range of motion. She exhibits no edema.  Neurological: She is alert and oriented to person, place, and time. She has normal reflexes. No cranial nerve deficit. Coordination (.1a) normal.  Skin: Skin is warm and dry. No rash noted. No erythema. No pallor.  Psychiatric: She has a normal mood and affect. Judgment normal.    CMP     Component Value Date/Time   NA 141 01/02/2018 1209   K 4.2 01/02/2018 1209   CL 105 01/02/2018 1209   CO2 22 01/02/2018 1209   GLUCOSE 141 (H) 01/02/2018 1209   GLUCOSE 438 (H) 06/06/2017 0811   BUN 18 01/02/2018 1209   CREATININE 1.26 (H) 01/02/2018 1209   CREATININE 1.13 (H) 08/17/2012 1031   CALCIUM 8.9 01/02/2018 1209   PROT 6.0 01/02/2018 1209   ALBUMIN 3.7 01/02/2018 1209   AST 13 01/02/2018 1209   ALT 16 01/02/2018 1209   ALKPHOS 63 01/02/2018 1209   BILITOT 0.3 01/02/2018 1209   GFRNONAA 52 (L) 01/02/2018 1209   GFRNONAA 61 08/17/2012 1031   GFRAA 59 (L) 01/02/2018 1209   GFRAA 70 08/17/2012 1031     Diabetic Labs (most recent): Lab Results  Component Value Date   HGBA1C 6.4 (H) 01/02/2018   HGBA1C 8.6 (H) 09/04/2017   HGBA1C 10.8 (H) 06/06/2017     Lipid Panel ( most recent) Lipid Panel     Component Value Date/Time   CHOL 106 09/04/2017 1110   CHOL 85 08/17/2012 1031   TRIG 262 (H) 09/04/2017 1110   TRIG 161 (H) 08/17/2012 1031   HDL 26 (L) 09/04/2017 1110   HDL 26 (L) 08/17/2012 1031   CHOLHDL 4.1 09/04/2017 1110   CHOLHDL 10.3 11/29/2012 1030   VLDL 39 11/29/2012 1030   LDLCALC 28 09/04/2017 1110   LDLCALC 27 08/17/2012 1031      Lab Results  Component  Value Date   TSH 0.346 (L) 01/02/2018   TSH 0.637 09/04/2017   TSH 1.820 12/15/2016   TSH 1.760 12/07/2015   TSH 3.780 03/27/2015   TSH 4.950 (H) 11/14/2014   TSH 2.990 03/19/2013   TSH 2.602  11/28/2012   TSH 3.340 11/27/2012   FREET4 1.68 01/02/2018   FREET4 1.52 09/04/2017   FREET4 1.19 11/14/2014      Assessment & Plan:   1. Type 2 diabetes mellitus with stage 3 chronic kidney disease, without long-term current use of insulin (HCC)  - Diane Morgan has currently uncontrolled symptomatic type 2 DM since 47 years of age. -She came with near target blood glucose profile, A1c improving to 6.4% from 10.8%.  Her recent labs are reviewed showing improving renal function.    -her diabetes is complicated by obesity/sedentary life, smoking, and Diane Morgan remains at a high risk for more acute and chronic complications which include CAD, CVA, CKD, retinopathy, and neuropathy. These are all discussed in detail with the patient.  - I have counseled her on diet management and weight loss, by adopting a carbohydrate restricted/protein rich diet.  -  Suggestion is made for her to avoid simple carbohydrates  from her diet including Cakes, Sweet Desserts / Pastries, Ice Cream, Soda (diet and regular), Sweet Tea, Candies, Chips, Cookies, Store Bought Juices, Alcohol in Excess of  1-2 drinks a day, Artificial Sweeteners, and "Sugar-free" Products. This will help patient to have stable blood glucose profile and potentially avoid unintended weight gain.   - I encouraged her to switch to  unprocessed or minimally processed complex starch and increased protein intake (animal or plant source), fruits, and vegetables.  - she is advised to stick to a routine mealtimes to eat 3 meals  a day and avoid unnecessary snacks ( to snack only to correct hypoglycemia).   - she has been scheduled with Norm Salt, RDN, CDE for individualized diabetes education.  - I have approached her with the following  individualized plan to manage diabetes and patient agrees:   -Based on her presentation with near target blood glucose profile and improving A1c of 6.4% from 10.8%, she will not require insulin treatment at this time.  -Patient seems to have significant cognitive deficit, maximizing non-insulin therapeutic options would be entertained before considering insulin treatment.  -She is not a good candidate for full dose metformin, and she has been very well only on the weekly medication. -She is advised to continue Bydureon 2 mg subcutaneously weekly.   -If her renal function continues to improve, she will be considered for low-dose metformin on subsequent visits. -Patient is encouraged to call clinic for blood glucose levels less than 70 or above 200 mg /dl.  - Patient specific target  A1c;  LDL, HDL, Triglycerides, and  Waist Circumference were discussed in detail.  2) BP/HTN: Her blood pressure is controlled to target.     She is not on any antihypertensive medications.    3) Lipids/HPL:   Her recent lipid panel showed hypertriglyceridemia and controlled LDL at 27.   She is advised to continue atorvastatin 20 mg p.o. nightly.    4)  Weight/Diet: CDE Consult has been  initiated , exercise, and detailed carbohydrates information provided.  5) hypothyroidism: -Diagnosed at approximate age of 42 years. -Her previsit labs are consistent with appropriate replacement.  She is advised to continue  levothyroxine at 75 mcg p.o. every morning.    - We discussed about correct intake of levothyroxine, at fasting, with water, separated by at least 30 minutes from breakfast, and separated by more than 4 hours from calcium, iron, multivitamins, acid reflux medications (PPIs). -Patient is made aware of the fact that thyroid hormone replacement is needed for life, dose to  be adjusted by periodic monitoring of thyroid function tests.  6) Chronic Care/Health Maintenance:  -she  is on  Statin medications and  is  encouraged to continue to follow up with Ophthalmology, Dentist,  Podiatrist at least yearly or according to recommendations, and advised to away from smoking.    I have recommended yearly flu vaccine and pneumonia vaccination at least every 5 years; moderate intensity exercise for up to 150 minutes weekly; and  sleep for at least 7 hours a day.  - I advised patient to maintain close follow up with Dettinger, Elige Radon, MD for primary care needs.  - Time spent with the patient: 25 min, of which >50% was spent in reviewing her blood glucose logs , discussing her hypo- and hyper-glycemic episodes, reviewing her current and  previous labs and insulin doses and developing a plan to avoid hypo- and hyper-glycemia. Please refer to Patient Instructions for Blood Glucose Monitoring and Insulin/Medications Dosing Guide"  in media tab for additional information. Diane Morgan participated in the discussions, expressed understanding, and voiced agreement with the above plans.  All questions were answered to her satisfaction. she is encouraged to contact clinic should she have any questions or concerns prior to her return visit.   Follow up plan: - Return in about 4 months (around 05/14/2018) for Follow up with Pre-visit Labs.  Marquis Lunch, MD [facility] [address] [place], [place] [address] Phone: [phone]  Fax: [phone]    01/11/2018, 9:17 AM  This note was partially dictated with voice recognition software. Similar sounding words can be transcribed inadequately or may not  be corrected upon review.

## 2018-01-11 NOTE — Progress Notes (Signed)
Diabetes Self-Management Education  Visit Type:    Appt. Start Time: 0900  ppt. End Time: 0930  01/11/2018  [name], identified by name and date of birth, is a 47 y.o. female with a diagnosis of Diabetes Type 2:  .   Has been working on portion sizes, eating a lot more vegetables. Started woking out on treamill, exericise bike and drinking only water. Cut out sodas. Has been testing 3-4 times per day. Saw Dr. Fransico Him today. Taking Bydureon weekly. Making Great progress. Lose 13 lbs. A1C down to 6.3% from 8% 3 months ago.  ASSESSMENT  Height 5' (1.524 m), weight 182 lb (82.6 kg). Body mass index is 35.54 kg/m.   Vitals with BMI 01/11/2018 01/11/2018 10/19/2017 10/12/2017 09/11/2017  Height 5\' 0"  5\' 0"  5\' 0"  5\' 0"  5\' 0"   Weight 182 lbs 183 lbs 190 lbs 195 lbs 199 lbs  BMI 35.54 35.74 37.11 38.08 38.86  Systolic  136 128  127  Diastolic  86 80  86  Pulse  78 86  80  Respirations        Lab Results  Component Value Date   HGBA1C 6.4 (H) 01/02/2018   CMP Latest Ref Rng & Units 01/02/2018 09/04/2017 06/06/2017  Glucose 65 - 99 mg/dL 161(W) 960(A) 540(J)  BUN 6 - 24 mg/dL 18 15 14   Creatinine 0.57 - 1.00 mg/dL 8.11(B) 1.47(W) 2.95(A)  Sodium 134 - 144 mmol/L 141 141 130(L)  Potassium 3.5 - 5.2 mmol/L 4.2 4.7 4.3  Chloride 96 - 106 mmol/L 105 106 98(L)  CO2 20 - 29 mmol/L 22 21 22   Calcium 8.7 - 10.2 mg/dL 8.9 9.3 2.1(H)  Total Protein 6.0 - 8.5 g/dL 6.0 6.1 -  Total Bilirubin 0.0 - 1.2 mg/dL 0.3 0.3 -  Alkaline Phos 39 - 117 IU/L 63 68 -  AST 0 - 40 IU/L 13 15 -  ALT 0 - 32 IU/L 16 10 -   Lipid Panel     Component Value Date/Time   CHOL 106 09/04/2017 1110   CHOL 85 08/17/2012 1031   TRIG 262 (H) 09/04/2017 1110   TRIG 161 (H) 08/17/2012 1031   HDL 26 (L) 09/04/2017 1110   HDL 26 (L) 08/17/2012 1031   CHOLHDL 4.1 09/04/2017 1110   CHOLHDL 10.3 11/29/2012 1030   VLDL 39 11/29/2012 1030   LDLCALC 28 09/04/2017 1110   LDLCALC 27 08/17/2012 1031       Diabetes  Self-Management Education - 01/11/18 0947      Visit Information   Visit Type  Follow-up      Health Coping   How would you rate your overall health?  Good      Psychosocial Assessment   Patient Belief/Attitude about Diabetes  Motivated to manage diabetes    Other persons present  Parent    Special Needs  None    Preferred Learning Style  No preference indicated    Learning Readiness  Change in progress      Pre-Education Assessment   Patient understands the diabetes disease and treatment process.  Demonstrates understanding / competency    Patient understands incorporating nutritional management into lifestyle.  Demonstrates understanding / competency    Patient undertands incorporating physical activity into lifestyle.  Demonstrates understanding / competency    Patient understands using medications safely.  Demonstrates understanding / competency    Patient understands monitoring blood glucose, interpreting and using results  Demonstrates understanding / competency    Patient understands prevention, detection, and  treatment of acute complications.  Demonstrates understanding / competency    Patient understands prevention, detection, and treatment of chronic complications.  Demonstrates understanding / competency    Patient understands how to develop strategies to address psychosocial issues.  Demonstrates understanding / competency    Patient understands how to develop strategies to promote health/change behavior.  Demonstrates understanding / competency      Complications   Last HgB A1C per patient/outside source  6.4 %    How often do you check your blood sugar?  1-2 times/day    Fasting Blood glucose range (mg/dL)  16-109    Postprandial Blood glucose range (mg/dL)  60-454    Number of hypoglycemic episodes per month  0    Number of hyperglycemic episodes per week  0    Have you had a dilated eye exam in the past 12 months?  Yes    Have you had a dental exam in the past 12  months?  Yes    Are you checking your feet?  Yes    How many days per week are you checking your feet?  7      Exercise   Exercise Type  Moderate (swimming / aerobic walking)    How many days per week to you exercise?  3    How many minutes per day do you exercise?  60    Total minutes per week of exercise  180      Patient Education   Physical activity and exercise   Role of exercise on diabetes management, blood pressure control and cardiac health.;Helped patient identify appropriate exercises in relation to his/her diabetes, diabetes complications and other health issue.    Medications  Reviewed patients medication for diabetes, action, purpose, timing of dose and side effects.    Acute complications  Taught treatment of hypoglycemia - the 15 rule.;Discussed and identified patients' treatment of hyperglycemia.    Personal strategies to promote health  Lifestyle issues that need to be addressed for better diabetes care      Individualized Goals (developed by patient)   Physical Activity  Exercise 3-5 times per week;60 minutes per day    Medications  take my medication as prescribed    Reducing Risk  examine blood glucose patterns      Patient Self-Evaluation of Goals - Patient rates self as meeting previously set goals (% of time)   Nutrition  >75%    Physical Activity  >75%    Medications  >75%    Monitoring  >75%    Problem Solving  >75%    Reducing Risk  >75%    Health Coping  >75%      Post-Education Assessment   Patient understands the diabetes disease and treatment process.  Demonstrates understanding / competency    Patient understands incorporating nutritional management into lifestyle.  Demonstrates understanding / competency    Patient undertands incorporating physical activity into lifestyle.  Demonstrates understanding / competency    Patient understands using medications safely.  Demonstrates understanding / competency    Patient understands monitoring blood glucose,  interpreting and using results  Demonstrates understanding / competency    Patient understands prevention, detection, and treatment of acute complications.  Demonstrates understanding / competency    Patient understands prevention, detection, and treatment of chronic complications.  Demonstrates understanding / competency    Patient understands how to develop strategies to address psychosocial issues.  Demonstrates understanding / competency    Patient understands how to develop strategies  to promote health/change behavior.  Demonstrates understanding / competency      Outcomes   Expected Outcomes  Demonstrated interest in learning. Expect positive outcomes    Future DMSE  3-4 months    Program Status  Completed      Subsequent Visit   Since your last visit have you continued or begun to take your medications as prescribed?  Yes    Since your last visit have you had your blood pressure checked?  Yes    Is your most recent blood pressure lower, unchanged, or higher since your last visit?  Lower    Since your last visit have you experienced any weight changes?  Loss    Weight Loss (lbs)  13    Since your last visit, are you checking your blood glucose at least once a day?  Yes       Individualized Plan for Diabetes Self-Management Training:   Learning Objective:  Patient will have a greater understanding of diabetes self-management. Patient education plan is to attend individual and/or group sessions per assessed needs and concerns.   Plan:   Patient Instructions  Goals 1. Keep up the great job!! Work out  3-4 times per week on treadmill, bike.  Lose 2-3 lbs per month Keep A1C to 6.5% or less.    Expected Outcomes:  Demonstrated interest in learning. Expect positive outcomes  Education material provided: My Plate and Carbohydrate counting sheet  If problems or questions, patient to contact team via:  Phone and Email  Future DSME appointment: 3-4 months

## 2018-02-05 ENCOUNTER — Other Ambulatory Visit: Payer: Self-pay | Admitting: "Endocrinology

## 2018-02-12 ENCOUNTER — Other Ambulatory Visit: Payer: Self-pay

## 2018-02-12 MED ORDER — LEVOTHYROXINE SODIUM 75 MCG PO TABS: 75.0000 ug | ORAL_TABLET | Freq: Every day | ORAL | 2 refills | Status: DC

## 2018-02-26 ENCOUNTER — Other Ambulatory Visit: Payer: Self-pay

## 2018-02-26 ENCOUNTER — Other Ambulatory Visit: Payer: Self-pay | Admitting: Family Medicine

## 2018-02-26 MED ORDER — BYDUREON 2 MG ~~LOC~~ PEN: 2.0000 mg | PEN_INJECTOR | SUBCUTANEOUS | 2 refills | Status: DC

## 2018-03-15 ENCOUNTER — Other Ambulatory Visit: Payer: Self-pay | Admitting: *Deleted

## 2018-03-15 MED ORDER — FENOFIBRATE 145 MG PO TABS: 145.0000 mg | ORAL_TABLET | Freq: Every day | ORAL | 0 refills | Status: DC

## 2018-03-16 DIAGNOSIS — H1013 Acute atopic conjunctivitis, bilateral: Secondary | ICD-10-CM | POA: Diagnosis not present

## 2018-03-16 LAB — HM DIABETES EYE EXAM

## 2018-03-27 ENCOUNTER — Other Ambulatory Visit: Payer: Self-pay | Admitting: *Deleted

## 2018-03-29 ENCOUNTER — Other Ambulatory Visit: Payer: Self-pay | Admitting: *Deleted

## 2018-03-29 MED ORDER — OMEPRAZOLE 40 MG PO CPDR: 40.0000 mg | DELAYED_RELEASE_CAPSULE | Freq: Every day | ORAL | 0 refills | Status: DC

## 2018-04-02 ENCOUNTER — Telehealth: Payer: Self-pay

## 2018-04-02 DIAGNOSIS — E1165 Type 2 diabetes mellitus with hyperglycemia: Secondary | ICD-10-CM

## 2018-04-02 MED ORDER — BYDUREON 2 MG ~~LOC~~ PEN: 2.0000 mg | PEN_INJECTOR | SUBCUTANEOUS | 2 refills | Status: DC

## 2018-04-02 NOTE — Telephone Encounter (Signed)
SIGNED

## 2018-04-06 ENCOUNTER — Other Ambulatory Visit: Payer: Self-pay

## 2018-04-06 MED ORDER — FENOFIBRATE 145 MG PO TABS: 145.0000 mg | ORAL_TABLET | Freq: Every day | ORAL | 2 refills | Status: DC

## 2018-04-10 ENCOUNTER — Other Ambulatory Visit: Payer: Self-pay

## 2018-04-10 DIAGNOSIS — E1140 Type 2 diabetes mellitus with diabetic neuropathy, unspecified: Secondary | ICD-10-CM

## 2018-04-10 DIAGNOSIS — D509 Iron deficiency anemia, unspecified: Secondary | ICD-10-CM

## 2018-04-10 DIAGNOSIS — I10 Essential (primary) hypertension: Secondary | ICD-10-CM

## 2018-04-10 DIAGNOSIS — E1142 Type 2 diabetes mellitus with diabetic polyneuropathy: Secondary | ICD-10-CM

## 2018-04-10 MED ORDER — ATORVASTATIN CALCIUM 40 MG PO TABS: ORAL_TABLET | ORAL | 0 refills | Status: DC

## 2018-04-16 ENCOUNTER — Other Ambulatory Visit: Payer: Medicaid Other

## 2018-04-16 DIAGNOSIS — E039 Hypothyroidism, unspecified: Secondary | ICD-10-CM | POA: Diagnosis not present

## 2018-04-16 DIAGNOSIS — E1165 Type 2 diabetes mellitus with hyperglycemia: Secondary | ICD-10-CM | POA: Diagnosis not present

## 2018-04-17 LAB — COMPREHENSIVE METABOLIC PANEL
ALBUMIN: 3.6 g/dL — AB (ref 3.8–4.8)
ALK PHOS: 75 IU/L (ref 39–117)
ALT: 10 IU/L (ref 0–32)
AST: 10 IU/L (ref 0–40)
Albumin/Globulin Ratio: 1.7 (ref 1.2–2.2)
BILIRUBIN TOTAL: 0.3 mg/dL (ref 0.0–1.2)
BUN / CREAT RATIO: 12 (ref 9–23)
BUN: 17 mg/dL (ref 6–24)
CHLORIDE: 106 mmol/L (ref 96–106)
CO2: 19 mmol/L — ABNORMAL LOW (ref 20–29)
Calcium: 9.4 mg/dL (ref 8.7–10.2)
Creatinine, Ser: 1.39 mg/dL — ABNORMAL HIGH (ref 0.57–1.00)
GFR calc non Af Amer: 46 mL/min/{1.73_m2} — ABNORMAL LOW (ref >59)
GFR, EST AFRICAN AMERICAN: 53 mL/min/{1.73_m2} — AB (ref >59)
GLOBULIN, TOTAL: 2.1 g/dL (ref 1.5–4.5)
GLUCOSE: 145 mg/dL — AB (ref 65–99)
Potassium: 5 mmol/L (ref 3.5–5.2)
SODIUM: 141 mmol/L (ref 134–144)
TOTAL PROTEIN: 5.7 g/dL — AB (ref 6.0–8.5)

## 2018-04-17 LAB — T4, FREE: Free T4: 1.43 ng/dL (ref 0.82–1.77)

## 2018-04-17 LAB — TSH: TSH: 1.13 u[IU]/mL (ref 0.450–4.500)

## 2018-04-17 LAB — HEMOGLOBIN A1C
Est. average glucose Bld gHb Est-mCnc: 151 mg/dL
Hgb A1c MFr Bld: 6.9 % — ABNORMAL HIGH (ref 4.8–5.6)

## 2018-04-23 ENCOUNTER — Other Ambulatory Visit (HOSPITAL_COMMUNITY)
Admission: RE | Admit: 2018-04-23 | Discharge: 2018-04-23 | Disposition: A | Discharge status: Home / Self Care | Payer: Medicaid Other | Source: Ambulatory Visit | Attending: Obstetrics & Gynecology | Admitting: Obstetrics & Gynecology

## 2018-04-23 ENCOUNTER — Ambulatory Visit: Payer: Medicaid Other | Admitting: Obstetrics & Gynecology

## 2018-04-23 ENCOUNTER — Encounter: Payer: Self-pay | Admitting: Obstetrics & Gynecology

## 2018-04-23 VITALS — BP 142/87 | HR 79 | Ht 60.0 in | Wt 191.5 lb

## 2018-04-23 DIAGNOSIS — Z9889 Other specified postprocedural states: Secondary | ICD-10-CM | POA: Insufficient documentation

## 2018-04-23 DIAGNOSIS — R87613 High grade squamous intraepithelial lesion on cytologic smear of cervix (HGSIL): Secondary | ICD-10-CM | POA: Diagnosis not present

## 2018-04-23 NOTE — Progress Notes (Signed)
Chief Complaint  Patient presents with  . 6 month follow up    pap      48 y.o. [id number] Patient's last menstrual period was 04/08/2018 (approximate). The current method of family planning is none.  Outpatient Encounter Medications as of 04/23/2018  Medication Sig  . aspirin 81 MG chewable tablet Chew 81 mg by mouth daily.    Marland Kitchen. atorvastatin (LIPITOR) 40 MG tablet TAKE 40 MG BY MOUTH ONCE DAILY  . Blood Glucose Monitoring Suppl (ACCU-CHEK AVIVA) device Use as instructed  . BYDUREON 2 MG PEN Inject 2 mg into the skin once a week.  . cetirizine (ZYRTEC) 10 MG tablet Take 10 mg by mouth at bedtime.   . cycloSPORINE (RESTASIS) 0.05 % ophthalmic emulsion 1 drop 2 (two) times daily.  . fenofibrate (TRICOR) 145 MG tablet Take 1 tablet (145 mg total) by mouth daily.  Marland Kitchen. gabapentin (NEURONTIN) 300 MG capsule Take 2 capsules (600 mg total) by mouth 2 (two) times daily. (Needs to be seen before next refill)  . glucose blood (ACCU-CHEK AVIVA) test strip Use to test glucose 4 times a day.  . ibuprofen (ADVIL,MOTRIN) 200 MG tablet Take 400 mg by mouth every 6 (six) hours as needed for headache or moderate pain.  Marland Kitchen. levothyroxine (SYNTHROID, LEVOTHROID) 75 MCG tablet Take 1 tablet (75 mcg total) by mouth daily.  Marland Kitchen. omeprazole (PRILOSEC) 40 MG capsule Take 1 capsule (40 mg total) by mouth daily. (Needs to be seen before next refill)   No facility-administered encounter medications on file as of 04/23/2018.     Subjective 2nd of 4 post op Pap tests Negative so far S/p laser cone for HSIL Past Medical History:  Diagnosis Date  . Anemia   . Arthritis   . Cataract    right  . Cellulitis and abscess of trunk 11/28/2012  . Diabetic peripheral neuropathy (HCC)   . DM (diabetes mellitus) (HCC)   . Eczema   . GERD (gastroesophageal reflux disease)   . Hyperlipidemia   . Hypertension   . Hypothyroidism   . Leg pain, bilateral 10/08/09  . NIDDM (non-insulin dependent diabetes mellitus)   . URI  (upper respiratory infection) 07/01/10    Past Surgical History:  Procedure Laterality Date  . CATARACT EXTRACTION W/PHACO Right 06/12/2017   Procedure: CATARACT EXTRACTION WITH PHACOEMULSIFICATION  AND INTRAOCULAR LENS PLACEMENT RIGHT EYE;  Surgeon: Gemma PayorHunt, Kerry, MD;  Location: [facility];  Service: Ophthalmology;  Laterality: Right;  CDE: 1.16  . CHOLECYSTECTOMY    . EYE SURGERY     right catracts  . LASER ABLATION CONDOLAMATA N/A 03/08/2017   Procedure: LASER ABLATION OF THE CERVIX;  Surgeon: Lazaro ArmsEure, Luther H, MD;  Location: [facility];  Service: Gynecology;  Laterality: N/A;  . WISDOM TOOTH EXTRACTION      OB History    Gravida  0   Para  0   Term  0   Preterm  0   AB  0   Living  0     SAB  0   TAB  0   Ectopic  0   Multiple  0   Live Births  0           Allergies  Allergen Reactions  . Penicillins Anaphylaxis, Hives and Other (See Comments)    Childhood Reaction. Has patient had a PCN reaction causing immediate rash, facial/tongue/throat swelling, SOB or lightheadedness with hypotension: Yes Has patient had a PCN reaction causing severe rash involving mucus  membranes or skin necrosis: No Has patient had a PCN reaction that required hospitalization: No Has patient had a PCN reaction occurring within the last 10 years: No If all of the above answers are "NO", then may proceed with Cephalosporin use.   . Niaspan [Niacin] Other (See Comments)    Patient passes out.    Social History   Socioeconomic History  . Marital status: Single    Spouse name: Not on file  . Number of children: Not on file  . Years of education: Not on file  . Highest education level: Not on file  Occupational History  . Not on file  Social Needs  . Financial resource strain: Not on file  . Food insecurity:    Worry: Not on file    Inability: Not on file  . Transportation needs:    Medical: Not on file    Non-medical: Not on file  Tobacco Use  . Smoking status: Current Every Day  Smoker    Years: 1.00    Types: Cigarettes  . Smokeless tobacco: Never Used  . Tobacco comment: smokes 4 cig daily  Substance and Sexual Activity  . Alcohol use: No  . Drug use: No  . Sexual activity: Not Currently    Birth control/protection: None  Lifestyle  . Physical activity:    Days per week: Not on file    Minutes per session: Not on file  . Stress: Not on file  Relationships  . Social connections:    Talks on phone: Not on file    Gets together: Not on file    Attends religious service: Not on file    Active member of club or organization: Not on file    Attends meetings of clubs or organizations: Not on file    Relationship status: Not on file  Other Topics Concern  . Not on file  Social History Narrative  . Not on file    Family History  Problem Relation Age of Onset  . Diabetes Father   . Hypertension Mother   . Hyperlipidemia Mother   . Heart disease Maternal Grandmother   . Diabetes Paternal Grandmother   . Diabetes Paternal Grandfather     Medications:       Current Outpatient Medications:  .  aspirin 81 MG chewable tablet, Chew 81 mg by mouth daily.  , Disp: , Rfl:  .  atorvastatin (LIPITOR) 40 MG tablet, TAKE 40 MG BY MOUTH ONCE DAILY, Disp: 90 tablet, Rfl: 0 .  Blood Glucose Monitoring Suppl (ACCU-CHEK AVIVA) device, Use as instructed, Disp: 1 each, Rfl: 0 .  BYDUREON 2 MG PEN, Inject 2 mg into the skin once a week., Disp: 4 each, Rfl: 2 .  cetirizine (ZYRTEC) 10 MG tablet, Take 10 mg by mouth at bedtime. , Disp: , Rfl:  .  cycloSPORINE (RESTASIS) 0.05 % ophthalmic emulsion, 1 drop 2 (two) times daily., Disp: , Rfl:  .  fenofibrate (TRICOR) 145 MG tablet, Take 1 tablet (145 mg total) by mouth daily., Disp: 30 tablet, Rfl: 2 .  gabapentin (NEURONTIN) 300 MG capsule, Take 2 capsules (600 mg total) by mouth 2 (two) times daily. (Needs to be seen before next refill), Disp: 120 capsule, Rfl: 0 .  glucose blood (ACCU-CHEK AVIVA) test strip, Use to test  glucose 4 times a day., Disp: 150 each, Rfl: 3 .  ibuprofen (ADVIL,MOTRIN) 200 MG tablet, Take 400 mg by mouth every 6 (six) hours as needed for headache or moderate pain.,  Disp: , Rfl:  .  levothyroxine (SYNTHROID, LEVOTHROID) 75 MCG tablet, Take 1 tablet (75 mcg total) by mouth daily., Disp: 90 tablet, Rfl: 2 .  omeprazole (PRILOSEC) 40 MG capsule, Take 1 capsule (40 mg total) by mouth daily. (Needs to be seen before next refill), Disp: 30 capsule, Rfl: 0  Objective Blood pressure (!) 142/87, pulse 79, height 5' (1.524 m), weight 191 lb 8 oz (86.9 kg), last menstrual period 04/08/2018.  General WDWN female NAD Vulva:  normal appearing vulva with no masses, tenderness or lesions Vagina:  normal mucosa, no discharge Cervix:  no bleeding following Pap, no cervical motion tenderness and no lesions Uterus:   Adnexa: ovaries:,     Pertinent ROS No burning with urination, frequency or urgency No nausea, vomiting or diarrhea Nor fever chills or other constitutional symptoms   Labs or studies     Impression Diagnoses this Encounter::   ICD-10-CM   1. S/P laser ablation of the cervix Z98.890 Cytology - PAP( [facility])  2. High grade squamous intraepithelial cervical dysplasia R87.613 Cytology - PAP( [facility])    Established relevant diagnosis(es):   Plan/Recommendations: No orders of the defined types were placed in this encounter.   Labs or Scans Ordered: No orders of the defined types were placed in this encounter.   Management:: >every 6 month Pap tests for 2 years  Follow up No follow-ups on file.     All questions were answered.

## 2018-04-25 ENCOUNTER — Encounter: Payer: Medicaid Other | Attending: Family Medicine | Admitting: Nutrition

## 2018-04-25 ENCOUNTER — Encounter: Payer: Self-pay | Admitting: "Endocrinology

## 2018-04-25 ENCOUNTER — Ambulatory Visit: Payer: Medicaid Other | Admitting: "Endocrinology

## 2018-04-25 VITALS — Ht 60.0 in | Wt 192.0 lb

## 2018-04-25 VITALS — BP 134/83 | HR 83 | Ht 60.0 in | Wt 192.0 lb

## 2018-04-25 DIAGNOSIS — I10 Essential (primary) hypertension: Secondary | ICD-10-CM | POA: Diagnosis not present

## 2018-04-25 DIAGNOSIS — E1165 Type 2 diabetes mellitus with hyperglycemia: Secondary | ICD-10-CM | POA: Diagnosis not present

## 2018-04-25 DIAGNOSIS — E118 Type 2 diabetes mellitus with unspecified complications: Secondary | ICD-10-CM | POA: Diagnosis not present

## 2018-04-25 DIAGNOSIS — E782 Mixed hyperlipidemia: Secondary | ICD-10-CM | POA: Diagnosis not present

## 2018-04-25 DIAGNOSIS — IMO0002 Reserved for concepts with insufficient information to code with codable children: Secondary | ICD-10-CM

## 2018-04-25 DIAGNOSIS — E039 Hypothyroidism, unspecified: Secondary | ICD-10-CM

## 2018-04-25 DIAGNOSIS — E785 Hyperlipidemia, unspecified: Secondary | ICD-10-CM | POA: Diagnosis not present

## 2018-04-25 MED ORDER — BYDUREON 2 MG ~~LOC~~ PEN: 2.0000 mg | PEN_INJECTOR | SUBCUTANEOUS | 6 refills | Status: DC

## 2018-04-25 MED ORDER — GABAPENTIN 300 MG PO CAPS: 300.0000 mg | ORAL_CAPSULE | Freq: Every day | ORAL | 6 refills | Status: DC

## 2018-04-25 MED ORDER — LEVOTHYROXINE SODIUM 75 MCG PO TABS: 75.0000 ug | ORAL_TABLET | Freq: Every day | ORAL | 2 refills | Status: DC

## 2018-04-25 NOTE — Progress Notes (Signed)
Diabetes Self-Management Education  Visit Type:    Appt. Start Time: 0900  ppt. End Time: 0930  04/25/2018  Diane Morgan, identified by name and date of birth, is a 48 y.o. female with a new diagnosis of Diabetes Type 2:  .  A1C 6.9%, up from 6.4%.    Has been doing more with exercises and doing video tapes.  Gained 10 lbs.  Says she over did it over the holidays. Trying to get back on track. Taking Bydurion weekly. A1C 6.4%. Changes witih eating more fruits and vegetables.   Hasn't been exercising much. Kidney issues.. CKD Stg 3.  ASSESSMENT  Height 5' (1.524 m), weight 192 lb (87.1 kg), last menstrual period 04/08/2018. Body mass index is 37.5 kg/m.  Vitals with BMI 04/25/2018 04/25/2018 04/23/2018 01/11/2018  Height 5\' 0"  5\' 0"  5\' 0"  5\' 0"   Weight 192 lbs 192 lbs 191 lbs 8 oz 182 lbs  BMI 37.5 37.5 37.4 35.54  Systolic  134 142   Diastolic  83 87   Pulse  83 79   Respirations        Lab Results  Component Value Date   HGBA1C 6.9 (H) 04/16/2018   CMP Latest Ref Rng & Units 04/16/2018 01/02/2018 09/04/2017  Glucose 65 - 99 mg/dL 973(Z) 329(J) 242(A)  BUN 6 - 24 mg/dL 17 18 15   Creatinine 0.57 - 1.00 mg/dL 8.34(H) 9.62(I) 2.97(L)  Sodium 134 - 144 mmol/L 141 141 141  Potassium 3.5 - 5.2 mmol/L 5.0 4.2 4.7  Chloride 96 - 106 mmol/L 106 105 106  CO2 20 - 29 mmol/L 19(L) 22 21  Calcium 8.7 - 10.2 mg/dL 9.4 8.9 9.3  Total Protein 6.0 - 8.5 g/dL 8.9(Q) 6.0 6.1  Total Bilirubin 0.0 - 1.2 mg/dL 0.3 0.3 0.3  Alkaline Phos 39 - 117 IU/L 75 63 68  AST 0 - 40 IU/L 10 13 15   ALT 0 - 32 IU/L 10 16 10    Lipid Panel     Component Value Date/Time   CHOL 106 09/04/2017 1110   CHOL 85 08/17/2012 1031   TRIG 262 (H) 09/04/2017 1110   TRIG 161 (H) 08/17/2012 1031   HDL 26 (L) 09/04/2017 1110   HDL 26 (L) 08/17/2012 1031   CHOLHDL 4.1 09/04/2017 1110   CHOLHDL 10.3 11/29/2012 1030   VLDL 39 11/29/2012 1030   LDLCALC 28 09/04/2017 1110   LDLCALC 27 08/17/2012 1031     Individualized Plan for Diabetes Self-Management Training:   Learning Objective:  Patient will have a greater understanding of diabetes self-management. Patient education plan is to attend individual and/or group sessions per assessed needs and concerns.   Plan:    Goals 1. Be sure to eat three meals per day 2.  Continue to increase high fiber foods. 3. Continue to increase exercises. 4. Lose 3-5 lbs per month  Expected Outcomes:    Improved self management of her DM.  Education material provided: My Plate and Carbohydrate counting sheet  If problems or questions, patient to contact team via:  Phone and Email  Future DSME appointment:   3 months.

## 2018-04-25 NOTE — Progress Notes (Signed)
Endocrinology follow-up note       04/25/2018, 8:53 AM   Subjective:    Patient ID: Diane Morgan, female    DOB: 1970/05/15.  Diane Morgan is being seen in follow-up for management of currently controlled symptomatic type 2 diabetes, hypothyroidism, hyperlipidemia, hypertension. PMD:  Dettinger, Elige Radon, MD.   Past Medical History:  Diagnosis Date  . Anemia   . Arthritis   . Cataract    right  . Cellulitis and abscess of trunk 11/28/2012  . Diabetic peripheral neuropathy (HCC)   . DM (diabetes mellitus) (HCC)   . Eczema   . GERD (gastroesophageal reflux disease)   . Hyperlipidemia   . Hypertension   . Hypothyroidism   . Leg pain, bilateral 10/08/09  . NIDDM (non-insulin dependent diabetes mellitus)   . URI (upper respiratory infection) 07/01/10   Past Surgical History:  Procedure Laterality Date  . CATARACT EXTRACTION W/PHACO Right 06/12/2017   Procedure: CATARACT EXTRACTION WITH PHACOEMULSIFICATION  AND INTRAOCULAR LENS PLACEMENT RIGHT EYE;  Surgeon: Gemma Payor, MD;  Location: [facility];  Service: Ophthalmology;  Laterality: Right;  CDE: 1.16  . CHOLECYSTECTOMY    . EYE SURGERY     right catracts  . LASER ABLATION CONDOLAMATA N/A 03/08/2017   Procedure: LASER ABLATION OF THE CERVIX;  Surgeon: Lazaro Arms, MD;  Location: [facility];  Service: Gynecology;  Laterality: N/A;  . WISDOM TOOTH EXTRACTION     Social History   Socioeconomic History  . Marital status: Single    Spouse name: Not on file  . Number of children: Not on file  . Years of education: Not on file  . Highest education level: Not on file  Occupational History  . Not on file  Social Needs  . Financial resource strain: Not on file  . Food insecurity:    Worry: Not on file    Inability: Not on file  . Transportation needs:    Medical: Not on file    Non-medical: Not on file  Tobacco Use  . Smoking status: Current  Every Day Smoker    Years: 1.00    Types: Cigarettes  . Smokeless tobacco: Never Used  . Tobacco comment: smokes 4 cig daily  Substance and Sexual Activity  . Alcohol use: No  . Drug use: No  . Sexual activity: Not Currently    Birth control/protection: None  Lifestyle  . Physical activity:    Days per week: Not on file    Minutes per session: Not on file  . Stress: Not on file  Relationships  . Social connections:    Talks on phone: Not on file    Gets together: Not on file    Attends religious service: Not on file    Active member of club or organization: Not on file    Attends meetings of clubs or organizations: Not on file    Relationship status: Not on file  Other Topics Concern  . Not on file  Social History Narrative  . Not on file   Outpatient Encounter Medications as of 04/25/2018  Medication Sig  . aspirin 81 MG chewable  tablet Chew 81 mg by mouth daily.    Marland Kitchen. atorvastatin (LIPITOR) 40 MG tablet TAKE 40 MG BY MOUTH ONCE DAILY  . Blood Glucose Monitoring Suppl (ACCU-CHEK AVIVA) device Use as instructed  . BYDUREON 2 MG PEN Inject 2 mg into the skin once a week.  . cetirizine (ZYRTEC) 10 MG tablet Take 10 mg by mouth at bedtime.   . cycloSPORINE (RESTASIS) 0.05 % ophthalmic emulsion 1 drop 2 (two) times daily.  . fenofibrate (TRICOR) 145 MG tablet Take 1 tablet (145 mg total) by mouth daily.  Marland Kitchen. gabapentin (NEURONTIN) 300 MG capsule Take 1 capsule (300 mg total) by mouth at bedtime.  Marland Kitchen. glucose blood (ACCU-CHEK AVIVA) test strip Use to test glucose 4 times a day.  . ibuprofen (ADVIL,MOTRIN) 200 MG tablet Take 400 mg by mouth every 6 (six) hours as needed for headache or moderate pain.  Marland Kitchen. levothyroxine (SYNTHROID, LEVOTHROID) 75 MCG tablet Take 1 tablet (75 mcg total) by mouth daily.  Marland Kitchen. omeprazole (PRILOSEC) 40 MG capsule Take 1 capsule (40 mg total) by mouth daily. (Needs to be seen before next refill)  . [DISCONTINUED] BYDUREON 2 MG PEN Inject 2 mg into the skin once a  week.  . [DISCONTINUED] gabapentin (NEURONTIN) 300 MG capsule Take 2 capsules (600 mg total) by mouth 2 (two) times daily. (Needs to be seen before next refill)  . [DISCONTINUED] levothyroxine (SYNTHROID, LEVOTHROID) 75 MCG tablet Take 1 tablet (75 mcg total) by mouth daily.   No facility-administered encounter medications on file as of 04/25/2018.     ALLERGIES: Allergies  Allergen Reactions  . Penicillins Anaphylaxis, Hives and Other (See Comments)    Childhood Reaction. Has patient had a PCN reaction causing immediate rash, facial/tongue/throat swelling, SOB or lightheadedness with hypotension: Yes Has patient had a PCN reaction causing severe rash involving mucus membranes or skin necrosis: No Has patient had a PCN reaction that required hospitalization: No Has patient had a PCN reaction occurring within the last 10 years: No If all of the above answers are "NO", then may proceed with Cephalosporin use.   . Niaspan [Niacin] Other (See Comments)    Patient passes out.    VACCINATION STATUS: Immunization History  Administered Date(s) Administered  . Influenza,inj,Quad PF,6+ Mos 11/28/2012, 01/22/2014, 12/26/2014, 12/07/2015, 12/15/2016, 01/02/2018  . Tdap 03/19/2013    Diabetes  She presents for her follow-up diabetic visit. She has type 2 diabetes mellitus. Onset time: She was diagnosed at approximate age of [age]years. Her disease course has been worsening. There are no hypoglycemic associated symptoms. Pertinent negatives for hypoglycemia include no confusion, headaches, pallor or seizures. Pertinent negatives for diabetes include no blurred vision, no chest pain, no fatigue, no polydipsia, no polyphagia and no polyuria. There are no hypoglycemic complications. Symptoms are stable. Diabetic complications include nephropathy. Risk factors for coronary artery disease include dyslipidemia, diabetes mellitus, family history, obesity, hypertension, sedentary lifestyle and tobacco exposure.  Current diabetic treatments: She is on Jardiance 25 mg p.o. daily and Bydureon 2 mg [place]  weekly. Her weight is decreasing steadily. She is following a generally unhealthy diet. When asked about meal planning, she reported none. She has not had a previous visit with a dietitian. She rarely participates in exercise. Her overall blood glucose range is 130-140 mg/dl. An ACE inhibitor/angiotensin II receptor blocker is not being taken. She does not see a podiatrist.Eye exam is current (Status post cataract extraction.).  Hyperlipidemia  This is a chronic problem. The problem is uncontrolled. Recent lipid tests were  reviewed and are variable. Exacerbating diseases include diabetes, hypothyroidism and obesity. Pertinent negatives include no chest pain, myalgias or shortness of breath. Current antihyperlipidemic treatment includes statins. Risk factors for coronary artery disease include diabetes mellitus, dyslipidemia, obesity, hypertension, a sedentary lifestyle and family history.  Hypertension  This is a chronic problem. The current episode started more than 1 year ago. The problem is controlled. Pertinent negatives include no blurred vision, chest pain, headaches, palpitations or shortness of breath. Risk factors for coronary artery disease include dyslipidemia, diabetes mellitus, obesity, sedentary lifestyle and smoking/tobacco exposure. Hypertensive end-organ damage includes kidney disease.    Review of Systems  Constitutional: Negative for chills, fatigue, fever and unexpected weight change.  HENT: Negative for trouble swallowing and voice change.   Eyes: Negative for blurred vision and visual disturbance.  Respiratory: Negative for cough, shortness of breath and wheezing.   Cardiovascular: Negative for chest pain, palpitations and leg swelling.  Gastrointestinal: Negative for diarrhea, nausea and vomiting.  Endocrine: Negative for cold intolerance, heat intolerance, polydipsia, polyphagia and polyuria.   Musculoskeletal: Negative for arthralgias and myalgias.  Skin: Negative for color change, pallor, rash and wound.  Neurological: Negative for seizures and headaches.  Psychiatric/Behavioral: Negative for confusion and suicidal ideas.    Objective:    BP 134/83   Pulse 83   Ht 5' (1.524 m)   Wt 192 lb (87.1 kg)   LMP 04/08/2018 (Approximate)   BMI 37.50 kg/m   Wt Readings from Last 3 Encounters:  04/25/18 192 lb (87.1 kg)  04/23/18 191 lb 8 oz (86.9 kg)  01/11/18 182 lb (82.6 kg)     Physical Exam Constitutional:      Appearance: She is well-developed.  HENT:     Head: Normocephalic and atraumatic.  Neck:     Musculoskeletal: Normal range of motion and neck supple.     Thyroid: No thyromegaly.     Trachea: No tracheal deviation.  Cardiovascular:     Rate and Rhythm: Normal rate.  Pulmonary:     Effort: Pulmonary effort is normal.  Abdominal:     Tenderness: There is no abdominal tenderness. There is no guarding.  Musculoskeletal: Normal range of motion.  Skin:    General: Skin is warm and dry.     Coloration: Skin is not pale.     Findings: No erythema or rash.  Neurological:     Mental Status: She is alert and oriented to person, place, and time.     Cranial Nerves: No cranial nerve deficit.     Coordination: Coordination normal (.1a).     Deep Tendon Reflexes: Reflexes are normal and symmetric.  Psychiatric:        Judgment: Judgment normal.     CMP     Component Value Date/Time   NA 141 04/16/2018 0836   K 5.0 04/16/2018 0836   CL 106 04/16/2018 0836   CO2 19 (L) 04/16/2018 0836   GLUCOSE 145 (H) 04/16/2018 0836   GLUCOSE 438 (H) 06/06/2017 0811   BUN 17 04/16/2018 0836   CREATININE 1.39 (H) 04/16/2018 0836   CREATININE 1.13 (H) 08/17/2012 1031   CALCIUM 9.4 04/16/2018 0836   PROT 5.7 (L) 04/16/2018 0836   ALBUMIN 3.6 (L) 04/16/2018 0836   AST 10 04/16/2018 0836   ALT 10 04/16/2018 0836   ALKPHOS 75 04/16/2018 0836   BILITOT 0.3 04/16/2018 0836    GFRNONAA 46 (L) 04/16/2018 0836   GFRNONAA 61 08/17/2012 1031   GFRAA 53 (L) 04/16/2018 9678  GFRAA 70 08/17/2012 1031     Diabetic Labs (most recent): Lab Results  Component Value Date   HGBA1C 6.9 (H) 04/16/2018   HGBA1C 6.4 (H) 01/02/2018   HGBA1C 8.6 (H) 09/04/2017     Lipid Panel ( most recent) Lipid Panel     Component Value Date/Time   CHOL 106 09/04/2017 1110   CHOL 85 08/17/2012 1031   TRIG 262 (H) 09/04/2017 1110   TRIG 161 (H) 08/17/2012 1031   HDL 26 (L) 09/04/2017 1110   HDL 26 (L) 08/17/2012 1031   CHOLHDL 4.1 09/04/2017 1110   CHOLHDL 10.3 11/29/2012 1030   VLDL 39 11/29/2012 1030   LDLCALC 28 09/04/2017 1110   LDLCALC 27 08/17/2012 1031      Lab Results  Component Value Date   TSH 1.130 04/16/2018   TSH 0.346 (L) 01/02/2018   TSH 0.637 09/04/2017   TSH 1.820 12/15/2016   TSH 1.760 12/07/2015   TSH 3.780 03/27/2015   TSH 4.950 (H) 11/14/2014   TSH 2.990 03/19/2013   TSH 2.602 11/28/2012   TSH 3.340 11/27/2012   FREET4 1.43 04/16/2018   FREET4 1.68 01/02/2018   FREET4 1.52 09/04/2017   FREET4 1.19 11/14/2014      Assessment & Plan:   1. Type 2 diabetes mellitus with stage 3 chronic kidney disease, without long-term current use of insulin (HCC)  - Diane Morgan has currently uncontrolled symptomatic type 2 DM since 48 years of age. -She came with stable A1c of 6.9%, generally improved from 10.8%.    -  Her recent labs are reviewed showing improving renal function.    -her diabetes is complicated by obesity/sedentary life, smoking, and Diane Morgan remains at a high risk for more acute and chronic complications which include CAD, CVA, CKD, retinopathy, and neuropathy. These are all discussed in detail with the patient.  - I have counseled her on diet management and weight loss, by adopting a carbohydrate restricted/protein rich diet.  - Patient admits there is a room for improvement in her diet and drink choices. -  Suggestion is  made for her to avoid simple carbohydrates  from her diet including Cakes, Sweet Desserts / Pastries, Ice Cream, Soda (diet and regular), Sweet Tea, Candies, Chips, Cookies, Store Bought Juices, Alcohol in Excess of  1-2 drinks a day, Artificial Sweeteners, and "Sugar-free" Products. This will help patient to have stable blood glucose profile and potentially avoid unintended weight gain.  - I encouraged her to switch to  unprocessed or minimally processed complex starch and increased protein intake (animal or plant source), fruits, and vegetables.  - she is advised to stick to a routine mealtimes to eat 3 meals  a day and avoid unnecessary snacks ( to snack only to correct hypoglycemia).    - I have approached her with the following individualized plan to manage diabetes and patient agrees:   -Based on her presentation with near target blood glucose profile and stable A1c of 6.9%, she will not require insulin treatment at this time.    -Patient seems to have significant cognitive deficit, maximizing non-insulin therapeutic options would be entertained before considering insulin treatment.  -She is not a good candidate for full dose metformin, and she has done very well only on the weekly medication. -She is advised to continue Bydureon 2 mg subcutaneously weekly.    -If her renal function continues to improve, she will be considered for low-dose metformin on subsequent visits. -Patient is encouraged to call clinic  for blood glucose levels less than 70 or above 200 mg /dl.  - Patient specific target  A1c;  LDL, HDL, Triglycerides, and  Waist Circumference were discussed in detail.  2) BP/HTN: Her blood pressure is controlled to target.   She is not on any antihypertensive medications.    3) Lipids/HPL:   Her recent lipid panel showed hypertriglyceridemia and controlled LDL at 27.  She is advised to continue atorvastatin 20 mg p.o. nightly.     4)  Weight/Diet: CDE Consult has been  initiated  , exercise, and detailed carbohydrates information provided.  5) hypothyroidism: -Diagnosed at approximate age of 42 years. -Her previsit labs are consistent with appropriate replacement.  She is advised to continue  levothyroxine at 75 mcg p.o. every morning.      - We discussed about the correct intake of her thyroid hormone, on empty stomach at fasting, with water, separated by at least 30 minutes from breakfast and other medications,  and separated by more than 4 hours from calcium, iron, multivitamins, acid reflux medications (PPIs). -Patient is made aware of the fact that thyroid hormone replacement is needed for life, dose to be adjusted by periodic monitoring of thyroid function tests.   6) Chronic Care/Health Maintenance:  -she  is on  Statin medications and  is encouraged to continue to follow up with Ophthalmology, Dentist,  Podiatrist at least yearly or according to recommendations, and advised to away from smoking.    I have recommended yearly flu vaccine and pneumonia vaccination at least every 5 years; moderate intensity exercise for up to 150 minutes weekly; and  sleep for at least 7 hours a day.  -I have refilled her gabapentin 300 mg p.o. nightly for neuropathic pain.  - I advised patient to maintain close follow up with Dettinger, Elige Radon, MD for primary care needs.  - Time spent with the patient: 25 min, of which >50% was spent in reviewing her  current and  previous labs/studies, previous treatments, and medications doses and developing a plan for long-term care based on the latest recommendations for standards of care. Diane Morgan participated in the discussions, expressed understanding, and voiced agreement with the above plans.  All questions were answered to her satisfaction. she is encouraged to contact clinic should she have any questions or concerns prior to her return visit.   Follow up plan: - Return in about 6 months (around 10/24/2018) for Follow up with  Pre-visit Labs.  Marquis Lunch, MD [facility] [address] [place], [place] [address] Phone: [phone]  Fax: [phone]    04/25/2018, 8:53 AM  This note was partially dictated with voice recognition software. Similar sounding words can be transcribed inadequately or may not  be corrected upon review.

## 2018-04-25 NOTE — Patient Instructions (Addendum)
Goals 1. Be sure to eat three meals per day 2.  Continue to increase high fiber foods. 3. Continue to increase exercises. 4. Lose 3-5 lbs per month

## 2018-04-25 NOTE — Patient Instructions (Signed)

## 2018-04-26 LAB — CYTOLOGY - PAP
DIAGNOSIS: NEGATIVE
HPV: NOT DETECTED

## 2018-04-30 ENCOUNTER — Encounter: Payer: Self-pay | Admitting: Nutrition

## 2018-05-02 ENCOUNTER — Telehealth: Payer: Self-pay | Admitting: Obstetrics & Gynecology

## 2018-05-02 NOTE — Telephone Encounter (Signed)
Requesting PAP results. DOB verified. Informed PAP was normal.

## 2018-05-02 NOTE — Telephone Encounter (Signed)
Patient called, stated she got a mychart message from our office and she can't open it.  She thinks it's in regards to her pap a week ago.  [phone]

## 2018-05-14 ENCOUNTER — Ambulatory Visit: Payer: Medicaid Other | Admitting: Nutrition

## 2018-05-14 ENCOUNTER — Ambulatory Visit: Payer: Medicaid Other | Admitting: "Endocrinology

## 2018-06-20 ENCOUNTER — Other Ambulatory Visit: Payer: Self-pay

## 2018-06-20 DIAGNOSIS — E1165 Type 2 diabetes mellitus with hyperglycemia: Secondary | ICD-10-CM

## 2018-06-20 MED ORDER — BYDUREON 2 MG ~~LOC~~ PEN: 2.0000 mg | PEN_INJECTOR | SUBCUTANEOUS | 2 refills | Status: DC

## 2018-07-17 ENCOUNTER — Other Ambulatory Visit: Payer: Self-pay | Admitting: "Endocrinology

## 2018-07-18 ENCOUNTER — Other Ambulatory Visit: Payer: Self-pay | Admitting: "Endocrinology

## 2018-07-18 DIAGNOSIS — E1140 Type 2 diabetes mellitus with diabetic neuropathy, unspecified: Secondary | ICD-10-CM

## 2018-07-18 DIAGNOSIS — I10 Essential (primary) hypertension: Secondary | ICD-10-CM

## 2018-07-18 DIAGNOSIS — D509 Iron deficiency anemia, unspecified: Secondary | ICD-10-CM

## 2018-07-18 DIAGNOSIS — E1142 Type 2 diabetes mellitus with diabetic polyneuropathy: Secondary | ICD-10-CM

## 2018-08-08 ENCOUNTER — Telehealth: Payer: Self-pay | Admitting: Family Medicine

## 2018-08-08 NOTE — Telephone Encounter (Signed)
LM pt needs to schedule CPE with PCP

## 2018-09-18 ENCOUNTER — Other Ambulatory Visit: Payer: Self-pay | Admitting: "Endocrinology

## 2018-09-28 ENCOUNTER — Encounter: Payer: Medicaid Other | Admitting: Family Medicine

## 2018-10-11 ENCOUNTER — Other Ambulatory Visit: Payer: Self-pay | Admitting: "Endocrinology

## 2018-10-11 DIAGNOSIS — E1165 Type 2 diabetes mellitus with hyperglycemia: Secondary | ICD-10-CM

## 2018-10-11 DIAGNOSIS — E785 Hyperlipidemia, unspecified: Secondary | ICD-10-CM

## 2018-10-11 DIAGNOSIS — E039 Hypothyroidism, unspecified: Secondary | ICD-10-CM

## 2018-10-15 ENCOUNTER — Telehealth: Payer: Self-pay

## 2018-10-15 ENCOUNTER — Other Ambulatory Visit: Payer: Medicaid Other

## 2018-10-15 ENCOUNTER — Other Ambulatory Visit: Payer: Self-pay

## 2018-10-15 DIAGNOSIS — E039 Hypothyroidism, unspecified: Secondary | ICD-10-CM | POA: Diagnosis not present

## 2018-10-15 DIAGNOSIS — I10 Essential (primary) hypertension: Secondary | ICD-10-CM | POA: Diagnosis not present

## 2018-10-15 DIAGNOSIS — E785 Hyperlipidemia, unspecified: Secondary | ICD-10-CM | POA: Diagnosis not present

## 2018-10-15 DIAGNOSIS — E1165 Type 2 diabetes mellitus with hyperglycemia: Secondary | ICD-10-CM

## 2018-10-15 MED ORDER — BYDUREON 2 MG ~~LOC~~ PEN: 2.0000 mg | PEN_INJECTOR | SUBCUTANEOUS | 2 refills | Status: DC

## 2018-10-15 NOTE — Telephone Encounter (Signed)
Diane Morgan, CMA  

## 2018-10-16 ENCOUNTER — Other Ambulatory Visit: Payer: Self-pay | Admitting: "Endocrinology

## 2018-10-16 DIAGNOSIS — D509 Iron deficiency anemia, unspecified: Secondary | ICD-10-CM

## 2018-10-16 DIAGNOSIS — E1140 Type 2 diabetes mellitus with diabetic neuropathy, unspecified: Secondary | ICD-10-CM

## 2018-10-16 DIAGNOSIS — E1142 Type 2 diabetes mellitus with diabetic polyneuropathy: Secondary | ICD-10-CM

## 2018-10-16 DIAGNOSIS — I10 Essential (primary) hypertension: Secondary | ICD-10-CM

## 2018-10-16 LAB — COMPREHENSIVE METABOLIC PANEL
ALT: 17 IU/L (ref 0–32)
AST: 22 IU/L (ref 0–40)
Albumin/Globulin Ratio: 1.6 (ref 1.2–2.2)
Albumin: 3.8 g/dL (ref 3.8–4.8)
Alkaline Phosphatase: 65 IU/L (ref 39–117)
BUN/Creatinine Ratio: 10 (ref 9–23)
BUN: 17 mg/dL (ref 6–24)
Bilirubin Total: 0.4 mg/dL (ref 0.0–1.2)
CO2: 22 mmol/L (ref 20–29)
Calcium: 9.8 mg/dL (ref 8.7–10.2)
Chloride: 98 mmol/L (ref 96–106)
Creatinine, Ser: 1.7 mg/dL — ABNORMAL HIGH (ref 0.57–1.00)
GFR calc Af Amer: 41 mL/min/{1.73_m2} — ABNORMAL LOW (ref >59)
GFR calc non Af Amer: 36 mL/min/{1.73_m2} — ABNORMAL LOW (ref >59)
Globulin, Total: 2.4 g/dL (ref 1.5–4.5)
Glucose: 107 mg/dL — ABNORMAL HIGH (ref 65–99)
Potassium: 4.3 mmol/L (ref 3.5–5.2)
Sodium: 135 mmol/L (ref 134–144)
Total Protein: 6.2 g/dL (ref 6.0–8.5)

## 2018-10-16 LAB — HEMOGLOBIN A1C
Est. average glucose Bld gHb Est-mCnc: 131 mg/dL
Hgb A1c MFr Bld: 6.2 % — ABNORMAL HIGH (ref 4.8–5.6)

## 2018-10-16 LAB — T4, FREE: Free T4: 1.69 ng/dL (ref 0.82–1.77)

## 2018-10-16 LAB — LIPID PANEL
Chol/HDL Ratio: 2.9 ratio (ref 0.0–4.4)
Cholesterol, Total: 82 mg/dL — ABNORMAL LOW (ref 100–199)
HDL: 28 mg/dL — ABNORMAL LOW (ref >39)
LDL Calculated: 23 mg/dL (ref 0–99)
Triglycerides: 156 mg/dL — ABNORMAL HIGH (ref 0–149)
VLDL Cholesterol Cal: 31 mg/dL (ref 5–40)

## 2018-10-16 LAB — TSH: TSH: 0.854 u[IU]/mL (ref 0.450–4.500)

## 2018-10-24 ENCOUNTER — Other Ambulatory Visit: Payer: Self-pay

## 2018-10-24 ENCOUNTER — Encounter: Payer: Medicaid Other | Attending: Family Medicine | Admitting: Nutrition

## 2018-10-24 ENCOUNTER — Encounter: Payer: Self-pay | Admitting: Nutrition

## 2018-10-24 ENCOUNTER — Ambulatory Visit: Payer: Medicaid Other | Admitting: "Endocrinology

## 2018-10-24 VITALS — Wt 187.0 lb

## 2018-10-24 DIAGNOSIS — N183 Chronic kidney disease, stage 3 unspecified: Secondary | ICD-10-CM

## 2018-10-24 DIAGNOSIS — E1122 Type 2 diabetes mellitus with diabetic chronic kidney disease: Secondary | ICD-10-CM | POA: Insufficient documentation

## 2018-10-24 DIAGNOSIS — E782 Mixed hyperlipidemia: Secondary | ICD-10-CM | POA: Insufficient documentation

## 2018-10-24 NOTE — Progress Notes (Signed)
Diabetes Self-Management Education  Visit Type:   Follow up telephone viist.   Appt. Start Time0930  ppt. End Time: 1000  10/24/2018  Diane Morgan, identified by name and date of birth, is a 48 y.o. female with a new diagnosis of Diabetes Type 2:  .    CKD and Hyperlipidemia. Lost 5 lbs. BS are less than 80-110 mg/dl.  Takes Bydrueon once a week. Has been riding her bike for exercise. Gets on treadmill and working in yard. Is going through Perimenopause. Has been doing more with exercises and doing video tapes.  Gained 10 lbs.  Says she over did it over the holidays. Trying to get back on track. Taking Bydurion weekly.   Her clothes are fitting better. A1C 6.2% down from 6.9%.  Changes witih eating more fruits and vegetables.   Kidney issues.. CKD Stg 3.  ASSESSMENT  There were no vitals taken for this visit. There is no height or weight on file to calculate BMI.  Vitals with BMI 04/25/2018 04/25/2018 04/23/2018 01/11/2018  Height 5\' 0"  5\' 0"  5\' 0"  5\' 0"   Weight 192 lbs 192 lbs 191 lbs 8 oz 182 lbs  BMI 37.5 37.5 37.4 35.54  Systolic  134 142   Diastolic  83 87   Pulse  83 79   Respirations        Lab Results  Component Value Date   HGBA1C 6.2 (H) 10/15/2018   CMP Latest Ref Rng & Units 10/15/2018 04/16/2018 01/02/2018  Glucose 65 - 99 mg/dL 161(W107(H) 960(A145(H) 540(J141(H)  BUN 6 - 24 mg/dL 17 17 18   Creatinine 0.57 - 1.00 mg/dL 8.11(B1.70(H) 1.47(W1.39(H) 2.95(A1.26(H)  Sodium 134 - 144 mmol/L 135 141 141  Potassium 3.5 - 5.2 mmol/L 4.3 5.0 4.2  Chloride 96 - 106 mmol/L 98 106 105  CO2 20 - 29 mmol/L 22 19(L) 22  Calcium 8.7 - 10.2 mg/dL 9.8 9.4 8.9  Total Protein 6.0 - 8.5 g/dL 6.2 2.1(H5.7(L) 6.0  Total Bilirubin 0.0 - 1.2 mg/dL 0.4 0.3 0.3  Alkaline Phos 39 - 117 IU/L 65 75 63  AST 0 - 40 IU/L 22 10 13   ALT 0 - 32 IU/L 17 10 16    Lipid Panel     Component Value Date/Time   CHOL 82 (L) 10/15/2018 0830   CHOL 85 08/17/2012 1031   TRIG 156 (H) 10/15/2018 0830   TRIG 161 (H) 08/17/2012 1031   HDL  28 (L) 10/15/2018 0830   HDL 26 (L) 08/17/2012 1031   CHOLHDL 2.9 10/15/2018 0830   CHOLHDL 10.3 11/29/2012 1030   VLDL 39 11/29/2012 1030   LDLCALC 23 10/15/2018 0830   LDLCALC 27 08/17/2012 1031   Breakfast: 1 slice ww toast with margarine and 2 eggs Lunch: broccoli, peppers, cabbage, cottage cheese, cherries 10, water Dinner: Chef salad, broccoli and cauliflower and 1 tbsp ranch dressing       Individualized Plan for Diabetes Self-Management Training:   Learning Objective:  Patient will have a greater understanding of diabetes self-management. Patient education plan is to attend individual and/or group sessions per assessed needs and concerns.   Plan:    Goals Keep up the great job! Keep drinking water. 1. Be sure to eat three meals per day 2.  Continue to increase high fiber foods. 3. Continue to ride bike 60 minutes a day 4. Lose 3-5 lbs per month  Expected Outcomes:    Improved self management of her DM.  Education material provided: My Plate and Carbohydrate counting  sheet  If problems or questions, patient to contact team via:  Phone and Email  Future DSME appointment:   3 months.

## 2018-10-24 NOTE — Patient Instructions (Signed)
Goals Keep up the great job! 1. Keep drinking water 2.  Continue to increase high fiber foods. 3. Exercise 60 minutes daily.. 4. Lose 3-5 lbs per month

## 2018-10-26 ENCOUNTER — Other Ambulatory Visit: Payer: Self-pay | Admitting: "Endocrinology

## 2018-10-26 ENCOUNTER — Encounter: Payer: Self-pay | Admitting: "Endocrinology

## 2018-10-26 ENCOUNTER — Other Ambulatory Visit: Payer: Self-pay

## 2018-10-26 ENCOUNTER — Ambulatory Visit (INDEPENDENT_AMBULATORY_CARE_PROVIDER_SITE_OTHER): Payer: Medicaid Other | Admitting: "Endocrinology

## 2018-10-26 DIAGNOSIS — I10 Essential (primary) hypertension: Secondary | ICD-10-CM

## 2018-10-26 DIAGNOSIS — E039 Hypothyroidism, unspecified: Secondary | ICD-10-CM

## 2018-10-26 DIAGNOSIS — E785 Hyperlipidemia, unspecified: Secondary | ICD-10-CM | POA: Diagnosis not present

## 2018-10-26 DIAGNOSIS — E1165 Type 2 diabetes mellitus with hyperglycemia: Secondary | ICD-10-CM | POA: Diagnosis not present

## 2018-10-26 NOTE — Progress Notes (Signed)
10/26/2018, 12:35 PM                                                    Endocrinology Telehealth Visit Follow up Note -During COVID -19 Pandemic  This visit type was conducted due to national recommendations for restrictions regarding the COVID-19 Pandemic  in an effort to limit this patient's exposure and mitigate transmission of the corona virus.  Due to her co-morbid illnesses, Diane Morgan is at  moderate to high risk for complications without adequate follow up.  This format is felt to be most appropriate for her at this time.  I connected with this patient on 10/26/2018   by telephone and verified that I am speaking with the correct person using two identifiers. Diane Morgan, 1970/04/09. she has verbally consented to this visit. All issues noted in this document were discussed and addressed. The format was not optimal for physical exam.    Subjective:    Patient ID: Diane Morgan, female    DOB: 27-Jun-1970.  Diane Morgan is being in telehealth via telephone in follow-up for management of currently controlled symptomatic type 2 diabetes, hypothyroidism, hyperlipidemia, hypertension. PMD:  Dettinger, Fransisca Kaufmann, MD.   Past Medical History:  Diagnosis Date  . Anemia   . Arthritis   . Cataract    right  . Cellulitis and abscess of trunk 11/28/2012  . Diabetic peripheral neuropathy ([place])   . DM (diabetes mellitus) ([place])   . Eczema   . GERD (gastroesophageal reflux disease)   . Hyperlipidemia   . Hypertension   . Hypothyroidism   . Leg pain, bilateral 10/08/09  . NIDDM (non-insulin dependent diabetes mellitus)   . URI (upper respiratory infection) 07/01/10   Past Surgical History:  Procedure Laterality Date  . CATARACT EXTRACTION W/PHACO Right 06/12/2017   Procedure: CATARACT EXTRACTION WITH PHACOEMULSIFICATION  AND INTRAOCULAR LENS PLACEMENT RIGHT EYE;  Surgeon: Tonny Branch, MD;  Location: [facility];  Service: Ophthalmology;  Laterality: Right;  CDE:  1.16  . CHOLECYSTECTOMY    . EYE SURGERY     right catracts  . LASER ABLATION CONDOLAMATA N/A 03/08/2017   Procedure: LASER ABLATION OF THE CERVIX;  Surgeon: Florian Buff, MD;  Location: [facility];  Service: Gynecology;  Laterality: N/A;  . WISDOM TOOTH EXTRACTION     Social History   Socioeconomic History  . Marital status: Single    Spouse name: Not on file  . Number of children: Not on file  . Years of education: Not on file  . Highest education level: Not on file  Occupational History  . Not on file  Social Needs  . Financial resource strain: Not on file  . Food insecurity    Worry: Not on file    Inability: Not on file  . Transportation needs    Medical: Not on file    Non-medical: Not on file  Tobacco Use  . Smoking status: Current Every Day Smoker    Years: 1.00    Types: Cigarettes  . Smokeless tobacco: Never Used  . Tobacco comment: smokes 4 cig daily  Substance and Sexual Activity  . Alcohol use: No  . Drug use: No  . Sexual activity: Not Currently    Birth control/protection: None  Lifestyle  . Physical activity  Days per week: Not on file    Minutes per session: Not on file  . Stress: Not on file  Relationships  . Social Herbalist on phone: Not on file    Gets together: Not on file    Attends religious service: Not on file    Active member of club or organization: Not on file    Attends meetings of clubs or organizations: Not on file    Relationship status: Not on file  Other Topics Concern  . Not on file  Social History Narrative  . Not on file   Outpatient Encounter Medications as of 10/26/2018  Medication Sig  . aspirin 81 MG chewable tablet Chew 81 mg by mouth daily.    Marland Kitchen atorvastatin (LIPITOR) 40 MG tablet Take 1 tablet by mouth once daily  . Blood Glucose Monitoring Suppl (ACCU-CHEK AVIVA PLUS) w/Device KIT USE AS DIRECTED  . BYDUREON 2 MG PEN Inject 2 mg into the skin once a week.  . cetirizine (ZYRTEC) 10 MG tablet Take 10 mg  by mouth at bedtime.   . cycloSPORINE (RESTASIS) 0.05 % ophthalmic emulsion 1 drop 2 (two) times daily.  . fenofibrate (TRICOR) 145 MG tablet Take 1 tablet by mouth once daily  . gabapentin (NEURONTIN) 300 MG capsule Take 1 capsule (300 mg total) by mouth at bedtime.  Marland Kitchen glucose blood (ACCU-CHEK AVIVA) test strip Use to test glucose 4 times a day.  . ibuprofen (ADVIL,MOTRIN) 200 MG tablet Take 400 mg by mouth every 6 (six) hours as needed for headache or moderate pain.  Marland Kitchen levothyroxine (SYNTHROID, LEVOTHROID) 75 MCG tablet Take 1 tablet (75 mcg total) by mouth daily.  Marland Kitchen omeprazole (PRILOSEC) 40 MG capsule Take 1 capsule (40 mg total) by mouth daily. (Needs to be seen before next refill)   No facility-administered encounter medications on file as of 10/26/2018.     ALLERGIES: Allergies  Allergen Reactions  . Penicillins Anaphylaxis, Hives and Other (See Comments)    Childhood Reaction. Has patient had a PCN reaction causing immediate rash, facial/tongue/throat swelling, SOB or lightheadedness with hypotension: Yes Has patient had a PCN reaction causing severe rash involving mucus membranes or skin necrosis: No Has patient had a PCN reaction that required hospitalization: No Has patient had a PCN reaction occurring within the last 10 years: No If all of the above answers are "NO", then may proceed with Cephalosporin use.   . Niaspan [Niacin] Other (See Comments)    Patient passes out.    VACCINATION STATUS: Immunization History  Administered Date(s) Administered  . Influenza,inj,Quad PF,6+ Mos 11/28/2012, 01/22/2014, 12/26/2014, 12/07/2015, 12/15/2016, 01/02/2018  . Tdap 03/19/2013    Diabetes She presents for her follow-up diabetic visit. She has type 2 diabetes mellitus. Onset time: She was diagnosed at approximate age of 48years. Her disease course has been stable. There are no hypoglycemic associated symptoms. Pertinent negatives for hypoglycemia include no confusion, headaches,  pallor or seizures. Pertinent negatives for diabetes include no blurred vision, no chest pain, no fatigue, no polydipsia, no polyphagia and no polyuria. There are no hypoglycemic complications. Symptoms are stable. Diabetic complications include nephropathy. Risk factors for coronary artery disease include dyslipidemia, diabetes mellitus, family history, obesity, hypertension, sedentary lifestyle and tobacco exposure. Current diabetic treatments: She is on Jardiance 25 mg p.o. daily and Bydureon 2 mg Barrera  weekly. Her weight is decreasing steadily. She is following a generally unhealthy diet. When asked about meal planning, she reported none. She has not had  a previous visit with a dietitian. She rarely participates in exercise. An ACE inhibitor/angiotensin II receptor blocker is not being taken. She does not see a podiatrist.Eye exam is current (Status post cataract extraction.).  Hyperlipidemia This is a chronic problem. The problem is uncontrolled. Recent lipid tests were reviewed and are variable. Exacerbating diseases include diabetes, hypothyroidism and obesity. Pertinent negatives include no chest pain, myalgias or shortness of breath. Current antihyperlipidemic treatment includes statins. Risk factors for coronary artery disease include diabetes mellitus, dyslipidemia, obesity, hypertension, a sedentary lifestyle and family history.  Hypertension This is a chronic problem. The current episode started more than 1 year ago. The problem is controlled. Pertinent negatives include no blurred vision, chest pain, headaches, palpitations or shortness of breath. Risk factors for coronary artery disease include dyslipidemia, diabetes mellitus, obesity, sedentary lifestyle and smoking/tobacco exposure. Hypertensive end-organ damage includes kidney disease.    Review of Systems  Constitutional: Negative for chills, fatigue, fever and unexpected weight change.  HENT: Negative for trouble swallowing and voice  change.   Eyes: Negative for blurred vision and visual disturbance.  Respiratory: Negative for cough, shortness of breath and wheezing.   Cardiovascular: Negative for chest pain, palpitations and leg swelling.  Gastrointestinal: Negative for diarrhea, nausea and vomiting.  Endocrine: Negative for cold intolerance, heat intolerance, polydipsia, polyphagia and polyuria.  Musculoskeletal: Negative for arthralgias and myalgias.  Skin: Negative for color change, pallor, rash and wound.  Neurological: Negative for seizures and headaches.  Psychiatric/Behavioral: Negative for confusion and suicidal ideas.    Objective:    There were no vitals taken for this visit.  Wt Readings from Last 3 Encounters:  10/24/18 187 lb (84.8 kg)  04/25/18 192 lb (87.1 kg)  04/25/18 192 lb (87.1 kg)      CMP     Component Value Date/Time   NA 135 10/15/2018 0830   K 4.3 10/15/2018 0830   CL 98 10/15/2018 0830   CO2 22 10/15/2018 0830   GLUCOSE 107 (H) 10/15/2018 0830   GLUCOSE 438 (H) 06/06/2017 0811   BUN 17 10/15/2018 0830   CREATININE 1.70 (H) 10/15/2018 0830   CREATININE 1.13 (H) 08/17/2012 1031   CALCIUM 9.8 10/15/2018 0830   PROT 6.2 10/15/2018 0830   ALBUMIN 3.8 10/15/2018 0830   AST 22 10/15/2018 0830   ALT 17 10/15/2018 0830   ALKPHOS 65 10/15/2018 0830   BILITOT 0.4 10/15/2018 0830   GFRNONAA 36 (L) 10/15/2018 0830   GFRNONAA 61 08/17/2012 1031   GFRAA 41 (L) 10/15/2018 0830   GFRAA 70 08/17/2012 1031     Diabetic Labs (most recent): Lab Results  Component Value Date   HGBA1C 6.2 (H) 10/15/2018   HGBA1C 6.9 (H) 04/16/2018   HGBA1C 6.4 (H) 01/02/2018     Lipid Panel ( most recent) Lipid Panel     Component Value Date/Time   CHOL 82 (L) 10/15/2018 0830   CHOL 85 08/17/2012 1031   TRIG 156 (H) 10/15/2018 0830   TRIG 161 (H) 08/17/2012 1031   HDL 28 (L) 10/15/2018 0830   HDL 26 (L) 08/17/2012 1031   CHOLHDL 2.9 10/15/2018 0830   CHOLHDL 10.3 11/29/2012 1030   VLDL 39  11/29/2012 1030   LDLCALC 23 10/15/2018 0830   LDLCALC 27 08/17/2012 1031      Lab Results  Component Value Date   TSH 0.854 10/15/2018   TSH 1.130 04/16/2018   TSH 0.346 (L) 01/02/2018   TSH 0.637 09/04/2017   TSH 1.820 12/15/2016  TSH 1.760 12/07/2015   TSH 3.780 03/27/2015   TSH 4.950 (H) 11/14/2014   TSH 2.990 03/19/2013   TSH 2.602 11/28/2012   FREET4 1.69 10/15/2018   FREET4 1.43 04/16/2018   FREET4 1.68 01/02/2018   FREET4 1.52 09/04/2017   FREET4 1.19 11/14/2014      Assessment & Plan:   1. Type 2 diabetes mellitus with stage 3 chronic kidney disease, without long-term current use of insulin (Diane Morgan)  - Diane Morgan has currently uncontrolled symptomatic type 2 DM since 48 years of age. -She came with continued improvement in her A1c to 6.2%, progressively improving from 10.8%.    -  Her recent labs are reviewed showing improving renal function.    -her diabetes is complicated by obesity/sedentary life, smoking, and Diane Morgan remains at a high risk for more acute and chronic complications which include CAD, CVA, CKD, retinopathy, and neuropathy. These are all discussed in detail with the patient.  - I have counseled her on diet management and weight loss, by adopting a carbohydrate restricted/protein rich diet.  - she  admits there is a room for improvement in her diet and drink choices. -  Suggestion is made for her to avoid simple carbohydrates  from her diet including Cakes, Sweet Desserts / Pastries, Ice Cream, Soda (diet and regular), Sweet Tea, Candies, Chips, Cookies, Sweet Pastries,  Store Bought Juices, Alcohol in Excess of  1-2 drinks a day, Artificial Sweeteners, Coffee Creamer, and "Sugar-free" Products. This will help patient to have stable blood glucose profile and potentially avoid unintended weight gain.   - I encouraged her to switch to  unprocessed or minimally processed complex starch and increased protein intake (animal or plant source),  fruits, and vegetables.  - she is advised to stick to a routine mealtimes to eat 3 meals  a day and avoid unnecessary snacks ( to snack only to correct hypoglycemia).    - I have approached her with the following individualized plan to manage diabetes and patient agrees:   -Based on her report of glycemia to near target, and A1c of 6.2% she will not require insulin treatment for now.   -Patient seems to have significant cognitive deficit, maximizing non-insulin therapeutic options would be entertained before considering insulin treatment.   -She is advised to continue Bydureon 2 mg subcutaneously weekly.    -If her renal function continues to improve, she will be considered for low-dose metformin on subsequent visits. -Patient is encouraged to call clinic for blood glucose levels less than 70 or above 200 mg /dl.  - Patient specific target  A1c;  LDL, HDL, Triglycerides, and  Waist Circumference were discussed in detail.  2) BP/HTN: she is advised to home monitor blood pressure and report if > 140/90 on 2 separate readings.  She is not on any antihypertensive medications.    3) Lipids/HPL:   Her recent lipid panel showed hypertriglyceridemia and controlled LDL at 27.  She is advised to continue atorvastatin 20 mg p.o. nightly.     4)  Weight/Diet: CDE Consult has been  initiated , exercise, and detailed carbohydrates information provided.  5) hypothyroidism: -Diagnosed at approximate age of 30 years. -Her previsit labs are consistent with appropriate replacement.  She is advised to continue  levothyroxine at 75 mcg p.o. every morning.      - We discussed about the correct intake of her thyroid hormone, on empty stomach at fasting, with water, separated by at least 30 minutes from breakfast and  other medications,  and separated by more than 4 hours from calcium, iron, multivitamins, acid reflux medications (PPIs). -Patient is made aware of the fact that thyroid hormone replacement is  needed for life, dose to be adjusted by periodic monitoring of thyroid function tests.   6) Chronic Care/Health Maintenance:  -she  is on  Statin medications and  is encouraged to continue to follow up with Ophthalmology, Dentist,  Podiatrist at least yearly or according to recommendations, and advised to away from smoking.    I have recommended yearly flu vaccine and pneumonia vaccination at least every 5 years; moderate intensity exercise for up to 150 minutes weekly; and  sleep for at least 7 hours a day.  -I have refilled her gabapentin 300 mg p.o. nightly for neuropathic pain.  - I advised patient to maintain close follow up with Dettinger, Fransisca Kaufmann, MD for primary care needs.  - Patient Care Time Today:  25 min, of which >50% was spent in  counseling and the rest reviewing her  current and  previous labs/studies, previous treatments, her blood glucose readings, and medications' doses and developing a plan for long-term care based on the latest recommendations for standards of care.   Diane Morgan participated in the discussions, expressed understanding, and voiced agreement with the above plans.  All questions were answered to her satisfaction. she is encouraged to contact clinic should she have any questions or concerns prior to her return visit.   Follow up plan: - Return in about 6 months (around 04/28/2019) for Follow up with Pre-visit Labs.  Glade Lloyd, MD [facility] [address] [place], [place] [address] Phone: [phone]  Fax: [phone]    10/26/2018, 12:35 PM  This note was partially dictated with voice recognition software. Similar sounding words can be transcribed inadequately or may not  be corrected upon review.

## 2018-10-29 ENCOUNTER — Ambulatory Visit: Payer: Medicaid Other | Admitting: "Endocrinology

## 2019-01-04 ENCOUNTER — Other Ambulatory Visit: Payer: Self-pay

## 2019-01-07 ENCOUNTER — Other Ambulatory Visit: Payer: Self-pay

## 2019-01-07 ENCOUNTER — Ambulatory Visit: Payer: Medicaid Other | Admitting: Family Medicine

## 2019-01-07 ENCOUNTER — Encounter: Payer: Self-pay | Admitting: Family Medicine

## 2019-01-07 VITALS — BP 145/102 | HR 75 | Temp 99.1°F | Ht 61.0 in | Wt 187.6 lb

## 2019-01-07 DIAGNOSIS — E785 Hyperlipidemia, unspecified: Secondary | ICD-10-CM | POA: Insufficient documentation

## 2019-01-07 DIAGNOSIS — I10 Essential (primary) hypertension: Secondary | ICD-10-CM | POA: Diagnosis not present

## 2019-01-07 DIAGNOSIS — E1121 Type 2 diabetes mellitus with diabetic nephropathy: Secondary | ICD-10-CM

## 2019-01-07 DIAGNOSIS — E1142 Type 2 diabetes mellitus with diabetic polyneuropathy: Secondary | ICD-10-CM | POA: Insufficient documentation

## 2019-01-07 DIAGNOSIS — E039 Hypothyroidism, unspecified: Secondary | ICD-10-CM

## 2019-01-07 DIAGNOSIS — Z23 Encounter for immunization: Secondary | ICD-10-CM

## 2019-01-07 DIAGNOSIS — N1832 Chronic kidney disease, stage 3b: Secondary | ICD-10-CM | POA: Diagnosis not present

## 2019-01-07 DIAGNOSIS — E1122 Type 2 diabetes mellitus with diabetic chronic kidney disease: Secondary | ICD-10-CM

## 2019-01-07 LAB — BMP8+EGFR
BUN/Creatinine Ratio: 11 (ref 9–23)
BUN: 17 mg/dL (ref 6–24)
CO2: 22 mmol/L (ref 20–29)
Calcium: 8.5 mg/dL — ABNORMAL LOW (ref 8.7–10.2)
Chloride: 103 mmol/L (ref 96–106)
Creatinine, Ser: 1.57 mg/dL — ABNORMAL HIGH (ref 0.57–1.00)
GFR calc Af Amer: 45 mL/min/{1.73_m2} — ABNORMAL LOW (ref >59)
GFR calc non Af Amer: 39 mL/min/{1.73_m2} — ABNORMAL LOW (ref >59)
Glucose: 116 mg/dL — ABNORMAL HIGH (ref 65–99)
Potassium: 3.9 mmol/L (ref 3.5–5.2)
Sodium: 139 mmol/L (ref 134–144)

## 2019-01-07 MED ORDER — ATORVASTATIN CALCIUM 40 MG PO TABS: 40.0000 mg | ORAL_TABLET | Freq: Every day | ORAL | 3 refills | Status: DC

## 2019-01-07 MED ORDER — GABAPENTIN 300 MG PO CAPS: 300.0000 mg | ORAL_CAPSULE | Freq: Every day | ORAL | 2 refills | Status: DC

## 2019-01-07 MED ORDER — LEVOTHYROXINE SODIUM 50 MCG PO TABS: 50.0000 ug | ORAL_TABLET | Freq: Every day | ORAL | 1 refills | Status: DC

## 2019-01-07 MED ORDER — BYDUREON 2 MG ~~LOC~~ PEN: 2.0000 mg | PEN_INJECTOR | SUBCUTANEOUS | 2 refills | Status: DC

## 2019-01-07 MED ORDER — FENOFIBRATE 145 MG PO TABS: 145.0000 mg | ORAL_TABLET | Freq: Every day | ORAL | 3 refills | Status: DC

## 2019-01-07 NOTE — Progress Notes (Signed)
BP (!) 145/102   Pulse 75   Temp 99.1 F (37.3 C) (Temporal)   Ht 5' 1"  (1.549 m)   Wt 187 lb 9.6 oz (85.1 kg)   SpO2 97%   BMI 35.45 kg/m    Subjective:   Patient ID: Diane Morgan, female    DOB: 1970-05-18, 48 y.o.   MRN: [id number]  HPI: Diane Morgan is a 48 y.o. female presenting on 01/07/2019 for Annual Exam   HPI Adult well exam and physical Patient denies any chest pain, shortness of breath, headaches or vision issues, abdominal complaints, diarrhea, nausea, vomiting, or joint issues.  Patient has diabetes and hypertension hyperlipidemia and then sees endocrinology and Dr. Dorris Fetch for these symptoms managed over there, it does look like her renal function was up last time and we will check that again today.  Relevant past medical, surgical, family and social history reviewed and updated as indicated. Interim medical history since our last visit reviewed. Allergies and medications reviewed and updated.  Review of Systems  Constitutional: Negative for chills and fever.  Eyes: Negative for visual disturbance.  Respiratory: Negative for chest tightness and shortness of breath.   Cardiovascular: Negative for chest pain and leg swelling.  Musculoskeletal: Negative for back pain and gait problem.  Skin: Negative for rash.  Neurological: Negative for light-headedness and headaches.  Psychiatric/Behavioral: Negative for agitation and behavioral problems.  All other systems reviewed and are negative.   Per HPI unless specifically indicated above   Allergies as of 01/07/2019      Reactions   Penicillins Anaphylaxis, Hives, Other (See Comments)   Childhood Reaction. Has patient had a PCN reaction causing immediate rash, facial/tongue/throat swelling, SOB or lightheadedness with hypotension: Yes Has patient had a PCN reaction causing severe rash involving mucus membranes or skin necrosis: No Has patient had a PCN reaction that required hospitalization: No Has patient had  a PCN reaction occurring within the last 10 years: No If all of the above answers are "NO", then may proceed with Cephalosporin use.   Niaspan [niacin] Other (See Comments)   Patient passes out.      Medication List       Accurate as of January 07, 2019 10:55 AM. If you have any questions, ask your nurse or doctor.        STOP taking these medications   omeprazole 40 MG capsule Commonly known as: PRILOSEC Stopped by: Fransisca Kaufmann Dettinger, MD     TAKE these medications   Accu-Chek Aviva Plus w/Device Kit USE AS DIRECTED   aspirin 81 MG chewable tablet Chew 81 mg by mouth daily.   atorvastatin 40 MG tablet Commonly known as: LIPITOR Take 1 tablet (40 mg total) by mouth daily.   Bydureon 2 MG Pen Generic drug: Exenatide ER Inject 2 mg into the skin once a week.   cetirizine 10 MG tablet Commonly known as: ZYRTEC Take 10 mg by mouth at bedtime.   cycloSPORINE 0.05 % ophthalmic emulsion Commonly known as: RESTASIS 1 drop 2 (two) times daily.   fenofibrate 145 MG tablet Commonly known as: TRICOR Take 1 tablet (145 mg total) by mouth daily.   gabapentin 300 MG capsule Commonly known as: NEURONTIN Take 1 capsule (300 mg total) by mouth at bedtime. What changed:   how much to take  how to take this  when to take this   glucose blood test strip Commonly known as: Accu-Chek Aviva Use to test glucose 4 times a day.  ibuprofen 200 MG tablet Commonly known as: ADVIL Take 400 mg by mouth every 6 (six) hours as needed for headache or moderate pain.   levothyroxine 50 MCG tablet Commonly known as: SYNTHROID Take 1 tablet (50 mcg total) by mouth daily before breakfast. What changed: Another medication with the same name was removed. Continue taking this medication, and follow the directions you see here. Changed by: Fransisca Kaufmann Dettinger, MD        Objective:   BP (!) 145/102   Pulse 75   Temp 99.1 F (37.3 C) (Temporal)   Ht 5' 1"  (1.549 m)   Wt 187 lb 9.6  oz (85.1 kg)   SpO2 97%   BMI 35.45 kg/m   Wt Readings from Last 3 Encounters:  01/07/19 187 lb 9.6 oz (85.1 kg)  10/24/18 187 lb (84.8 kg)  04/25/18 192 lb (87.1 kg)    Physical Exam Vitals signs and nursing note reviewed.  Constitutional:      General: She is not in acute distress.    Appearance: She is well-developed. She is not diaphoretic.  Eyes:     Conjunctiva/sclera: Conjunctivae normal.  Cardiovascular:     Rate and Rhythm: Normal rate and regular rhythm.     Heart sounds: Normal heart sounds. No murmur.  Pulmonary:     Effort: Pulmonary effort is normal. No respiratory distress.     Breath sounds: Normal breath sounds. No wheezing.  Musculoskeletal: Normal range of motion.        General: No tenderness.  Skin:    General: Skin is warm and dry.     Findings: No rash.  Neurological:     Mental Status: She is alert and oriented to person, place, and time.     Coordination: Coordination normal.  Psychiatric:        Behavior: Behavior normal.       Assessment & Plan:   Problem List Items Addressed This Visit      Cardiovascular and Mediastinum   Hypertension   Relevant Medications   atorvastatin (LIPITOR) 40 MG tablet   fenofibrate (TRICOR) 145 MG tablet   Other Relevant Orders   BMP8+EGFR     Endocrine   Type 2 diabetes mellitus with stage 3 chronic kidney disease, without long-term current use of insulin ([facility])   Relevant Medications   atorvastatin (LIPITOR) 40 MG tablet   BYDUREON 2 MG PEN   fenofibrate (TRICOR) 145 MG tablet   Hypothyroidism   Relevant Medications   levothyroxine (SYNTHROID) 50 MCG tablet   Type 2 diabetes mellitus with diabetic polyneuropathy (HCC) - Primary   Relevant Medications   atorvastatin (LIPITOR) 40 MG tablet   BYDUREON 2 MG PEN   fenofibrate (TRICOR) 145 MG tablet   gabapentin (NEURONTIN) 300 MG capsule   Other Relevant Orders   BMP8+EGFR     Other   Morbid obesity (HCC)   Relevant Medications   BYDUREON 2 MG  PEN   Dyslipidemia   Relevant Medications   atorvastatin (LIPITOR) 40 MG tablet   fenofibrate (TRICOR) 145 MG tablet      Will do refills for the patient, patient sees Dr. Dorien Chihuahua for endocrinology for diabetes and thyroid and cholesterol, patient does have elevated renal function above her normal CKD on the last blood work so we will recheck today.  Patient's blood pressure is elevated today and we will have her come back in 2 to 4 weeks for recheck Follow up plan: Return in about 4 weeks (around 02/04/2019),  or if symptoms worsen or fail to improve, for Hypertension recheck.  Counseling provided for all of the vaccine components Orders Placed This Encounter  Procedures  . BMP8+EGFR    Caryl Pina, MD [facility] 01/07/2019, 10:55 AM

## 2019-01-07 NOTE — Addendum Note (Signed)
Addended by: Nigel Berthold C on: 01/07/2019 11:44 AM   Modules accepted: Orders

## 2019-01-08 ENCOUNTER — Telehealth: Payer: Self-pay | Admitting: "Endocrinology

## 2019-01-08 NOTE — Telephone Encounter (Signed)
Left msg to call back.

## 2019-01-08 NOTE — Telephone Encounter (Signed)
Pt's mom notified

## 2019-01-08 NOTE — Telephone Encounter (Signed)
Patient's mom, Lottie called and would like you to look at her kidney results.

## 2019-01-08 NOTE — Telephone Encounter (Signed)
Her kidney function is improving compared to her July readings, stay away from metformin for now.  Continue only on Bydureon.

## 2019-01-24 ENCOUNTER — Other Ambulatory Visit: Payer: Self-pay

## 2019-01-25 ENCOUNTER — Encounter: Payer: Self-pay | Admitting: Family Medicine

## 2019-01-25 ENCOUNTER — Ambulatory Visit: Payer: Medicaid Other | Admitting: Family Medicine

## 2019-01-25 VITALS — BP 128/79 | HR 81 | Temp 93.5°F | Ht 61.0 in | Wt 186.6 lb

## 2019-01-25 DIAGNOSIS — E1121 Type 2 diabetes mellitus with diabetic nephropathy: Secondary | ICD-10-CM

## 2019-01-25 DIAGNOSIS — I10 Essential (primary) hypertension: Secondary | ICD-10-CM

## 2019-01-25 DIAGNOSIS — E039 Hypothyroidism, unspecified: Secondary | ICD-10-CM | POA: Diagnosis not present

## 2019-01-25 DIAGNOSIS — E782 Mixed hyperlipidemia: Secondary | ICD-10-CM

## 2019-01-25 DIAGNOSIS — E781 Pure hyperglyceridemia: Secondary | ICD-10-CM

## 2019-01-25 DIAGNOSIS — E1142 Type 2 diabetes mellitus with diabetic polyneuropathy: Secondary | ICD-10-CM

## 2019-01-25 DIAGNOSIS — N1832 Chronic kidney disease, stage 3b: Secondary | ICD-10-CM | POA: Diagnosis not present

## 2019-01-25 DIAGNOSIS — E1122 Type 2 diabetes mellitus with diabetic chronic kidney disease: Secondary | ICD-10-CM

## 2019-01-25 LAB — BAYER DCA HB A1C WAIVED: HB A1C (BAYER DCA - WAIVED): 6.4 % (ref <7.0)

## 2019-01-25 NOTE — Progress Notes (Signed)
BP 128/79   Pulse 81   Temp (!) 93.5 F (34.2 C) (Temporal)   Ht 5' 1"  (1.549 m)   Wt 186 lb 9.6 oz (84.6 kg)   LMP 12/25/2018 (Approximate)   SpO2 97%   BMI 35.26 kg/m    Subjective:   Patient ID: Diane Morgan, female    DOB: 19-Oct-1970, 48 y.o.   MRN: [id number]  HPI: Diane Morgan is a 48 y.o. female presenting on 01/25/2019 for Diabetes (check up)   HPI Type 2 diabetes mellitus Patient does see Dr. Dorris Fetch with endocrinology.  Patient comes in today for recheck of his diabetes. Patient has been currently taking Bydureon, her A1c is 6.4. Patient is not currently on an ACE inhibitor/ARB. Patient has not seen an ophthalmologist this year. Patient denies any issues with their feet.   Hypothyroidism recheck Patient is coming in for thyroid recheck today as well. They deny any issues with hair changes or heat or cold problems or diarrhea or constipation. They deny any chest pain or palpitations. They are currently on levothyroxine 75 micrograms  Hyperlipidemia Patient is coming in for recheck of his hyperlipidemia. The patient is currently taking fenofibrate and atorvastatin. They deny any issues with myalgias or history of liver damage from it. They deny any focal numbness or weakness or chest pain.   GERD Patient is currently on omeprazole.  She denies any major symptoms or abdominal pain or belching or burping. She denies any blood in her stool or lightheadedness or dizziness.   Relevant past medical, surgical, family and social history reviewed and updated as indicated. Interim medical history since our last visit reviewed. Allergies and medications reviewed and updated.  Review of Systems  Constitutional: Negative for chills and fever.  Eyes: Negative for visual disturbance.  Respiratory: Negative for chest tightness and shortness of breath.   Cardiovascular: Negative for chest pain and leg swelling.  Musculoskeletal: Negative for back pain and gait problem.  Skin:  Negative for rash.  Neurological: Negative for light-headedness and headaches.  Psychiatric/Behavioral: Negative for agitation and behavioral problems.  All other systems reviewed and are negative.   Per HPI unless specifically indicated above   Allergies as of 01/25/2019      Reactions   Penicillins Anaphylaxis, Hives, Other (See Comments)   Childhood Reaction. Has patient had a PCN reaction causing immediate rash, facial/tongue/throat swelling, SOB or lightheadedness with hypotension: Yes Has patient had a PCN reaction causing severe rash involving mucus membranes or skin necrosis: No Has patient had a PCN reaction that required hospitalization: No Has patient had a PCN reaction occurring within the last 10 years: No If all of the above answers are "NO", then may proceed with Cephalosporin use.   Niaspan [niacin] Other (See Comments)   Patient passes out.      Medication List       Accurate as of January 25, 2019  9:44 AM. If you have any questions, ask your nurse or doctor.        Accu-Chek Aviva Plus w/Device Kit USE AS DIRECTED   aspirin 81 MG chewable tablet Chew 81 mg by mouth daily.   atorvastatin 40 MG tablet Commonly known as: LIPITOR Take 1 tablet (40 mg total) by mouth daily.   Bydureon 2 MG Pen Generic drug: Exenatide ER Inject 2 mg into the skin once a week.   cetirizine 10 MG tablet Commonly known as: ZYRTEC Take 10 mg by mouth at bedtime.   cycloSPORINE 0.05 % ophthalmic  emulsion Commonly known as: RESTASIS 1 drop 2 (two) times daily.   fenofibrate 145 MG tablet Commonly known as: TRICOR Take 1 tablet (145 mg total) by mouth daily.   gabapentin 300 MG capsule Commonly known as: NEURONTIN Take 1 capsule (300 mg total) by mouth at bedtime.   glucose blood test strip Commonly known as: Accu-Chek Aviva Use to test glucose 4 times a day.   ibuprofen 200 MG tablet Commonly known as: ADVIL Take 400 mg by mouth every 6 (six) hours as needed for  headache or moderate pain.   levothyroxine 75 MCG tablet Commonly known as: SYNTHROID Take 50 mcg by mouth daily before breakfast. What changed: Another medication with the same name was removed. Continue taking this medication, and follow the directions you see here. Changed by: Fransisca Kaufmann Lasandra Batley, MD   omeprazole 40 MG capsule Commonly known as: PRILOSEC Take 40 mg by mouth daily.        Objective:   BP 128/79   Pulse 81   Temp (!) 93.5 F (34.2 C) (Temporal)   Ht 5' 1"  (1.549 m)   Wt 186 lb 9.6 oz (84.6 kg)   LMP 12/25/2018 (Approximate)   SpO2 97%   BMI 35.26 kg/m   Wt Readings from Last 3 Encounters:  01/25/19 186 lb 9.6 oz (84.6 kg)  01/07/19 187 lb 9.6 oz (85.1 kg)  10/24/18 187 lb (84.8 kg)    Physical Exam Vitals signs and nursing note reviewed.  Constitutional:      General: She is not in acute distress.    Appearance: She is well-developed. She is not diaphoretic.  Eyes:     Conjunctiva/sclera: Conjunctivae normal.  Cardiovascular:     Rate and Rhythm: Normal rate and regular rhythm.     Heart sounds: Normal heart sounds. No murmur.  Pulmonary:     Effort: Pulmonary effort is normal. No respiratory distress.     Breath sounds: Normal breath sounds. No wheezing.  Musculoskeletal: Normal range of motion.        General: No tenderness.  Skin:    General: Skin is warm and dry.     Findings: No rash.  Neurological:     Mental Status: She is alert and oriented to person, place, and time.     Coordination: Coordination normal.  Psychiatric:        Behavior: Behavior normal.       Assessment & Plan:   Problem List Items Addressed This Visit      Cardiovascular and Mediastinum   Hypertension   Relevant Orders   CMP14+EGFR     Endocrine   Type 2 diabetes mellitus with stage 3 chronic kidney disease, without long-term current use of insulin (HCC)   Relevant Orders   Lipid panel   Hypothyroidism   Relevant Medications   levothyroxine  (SYNTHROID) 75 MCG tablet   Other Relevant Orders   TSH   Type 2 diabetes mellitus with diabetic polyneuropathy (HCC) - Primary   Relevant Orders   hgba1c   CMP14+EGFR     Other   Hyperlipidemia   Relevant Orders   Lipid panel   Hypertriglyceridemia   Relevant Orders   Lipid panel      Patient sees Dr. Dorien Chihuahua to help manage most of these issues, will continue to see him, A1c looks good and blood pressure looks good, we will continue to monitor Follow up plan: Return in about 6 months (around 07/25/2019), or if symptoms worsen or fail to improve, for  Diabetes and hypertension and GERD.  Counseling provided for all of the vaccine components Orders Placed This Encounter  Procedures  . hgba1c  . CMP14+EGFR  . Lipid panel  . TSH    Caryl Pina, MD [facility] 01/25/2019, 9:44 AM

## 2019-01-26 LAB — CMP14+EGFR
ALT: 16 IU/L (ref 0–32)
AST: 21 IU/L (ref 0–40)
Albumin/Globulin Ratio: 1.8 (ref 1.2–2.2)
Albumin: 3.8 g/dL (ref 3.8–4.8)
Alkaline Phosphatase: 65 IU/L (ref 39–117)
BUN/Creatinine Ratio: 13 (ref 9–23)
BUN: 19 mg/dL (ref 6–24)
Bilirubin Total: 0.5 mg/dL (ref 0.0–1.2)
CO2: 20 mmol/L (ref 20–29)
Calcium: 9.1 mg/dL (ref 8.7–10.2)
Chloride: 105 mmol/L (ref 96–106)
Creatinine, Ser: 1.42 mg/dL — ABNORMAL HIGH (ref 0.57–1.00)
GFR calc Af Amer: 51 mL/min/{1.73_m2} — ABNORMAL LOW (ref >59)
GFR calc non Af Amer: 44 mL/min/{1.73_m2} — ABNORMAL LOW (ref >59)
Globulin, Total: 2.1 g/dL (ref 1.5–4.5)
Glucose: 130 mg/dL — ABNORMAL HIGH (ref 65–99)
Potassium: 4 mmol/L (ref 3.5–5.2)
Sodium: 140 mmol/L (ref 134–144)
Total Protein: 5.9 g/dL — ABNORMAL LOW (ref 6.0–8.5)

## 2019-01-26 LAB — LIPID PANEL
Chol/HDL Ratio: 2.8 ratio (ref 0.0–4.4)
Cholesterol, Total: 88 mg/dL — ABNORMAL LOW (ref 100–199)
HDL: 31 mg/dL — ABNORMAL LOW (ref >39)
LDL Chol Calc (NIH): 31 mg/dL (ref 0–99)
Triglycerides: 151 mg/dL — ABNORMAL HIGH (ref 0–149)
VLDL Cholesterol Cal: 26 mg/dL (ref 5–40)

## 2019-01-26 LAB — TSH: TSH: 0.477 u[IU]/mL (ref 0.450–4.500)

## 2019-03-01 ENCOUNTER — Other Ambulatory Visit: Payer: Self-pay | Admitting: "Endocrinology

## 2019-03-01 DIAGNOSIS — E1165 Type 2 diabetes mellitus with hyperglycemia: Secondary | ICD-10-CM

## 2019-03-04 ENCOUNTER — Ambulatory Visit: Payer: Medicaid Other | Admitting: Nutrition

## 2019-03-04 ENCOUNTER — Telehealth: Payer: Self-pay | Admitting: Nutrition

## 2019-03-04 NOTE — Telephone Encounter (Signed)
No answer on either phone line and unable to leave message.PC

## 2019-03-14 DIAGNOSIS — H16223 Keratoconjunctivitis sicca, not specified as Sjogren's, bilateral: Secondary | ICD-10-CM | POA: Diagnosis not present

## 2019-03-14 DIAGNOSIS — H40033 Anatomical narrow angle, bilateral: Secondary | ICD-10-CM | POA: Diagnosis not present

## 2019-04-03 ENCOUNTER — Other Ambulatory Visit: Payer: Self-pay | Admitting: "Endocrinology

## 2019-04-03 ENCOUNTER — Telehealth: Payer: Self-pay | Admitting: "Endocrinology

## 2019-04-03 NOTE — Telephone Encounter (Signed)
Patient requesting refill on BYDUREON 2 MG PEN. [facility]

## 2019-04-03 NOTE — Telephone Encounter (Signed)
Called [employer] and started prior authorization process.

## 2019-04-04 NOTE — Telephone Encounter (Signed)
Prior authorization obtained from [facility] authorization [id number]. [facility] in [place] made aware.

## 2019-04-22 ENCOUNTER — Other Ambulatory Visit: Payer: Medicaid Other

## 2019-04-22 ENCOUNTER — Other Ambulatory Visit: Payer: Self-pay

## 2019-04-22 DIAGNOSIS — E039 Hypothyroidism, unspecified: Secondary | ICD-10-CM

## 2019-04-22 DIAGNOSIS — I10 Essential (primary) hypertension: Secondary | ICD-10-CM

## 2019-04-22 DIAGNOSIS — E1165 Type 2 diabetes mellitus with hyperglycemia: Secondary | ICD-10-CM | POA: Diagnosis not present

## 2019-04-23 ENCOUNTER — Other Ambulatory Visit: Payer: Self-pay | Admitting: Family Medicine

## 2019-04-23 LAB — COMPREHENSIVE METABOLIC PANEL
ALT: 20 IU/L (ref 0–32)
AST: 23 IU/L (ref 0–40)
Albumin/Globulin Ratio: 1.9 (ref 1.2–2.2)
Albumin: 4.1 g/dL (ref 3.8–4.8)
Alkaline Phosphatase: 76 IU/L (ref 39–117)
BUN/Creatinine Ratio: 10 (ref 9–23)
BUN: 13 mg/dL (ref 6–24)
Bilirubin Total: 0.5 mg/dL (ref 0.0–1.2)
CO2: 22 mmol/L (ref 20–29)
Calcium: 9.3 mg/dL (ref 8.7–10.2)
Chloride: 105 mmol/L (ref 96–106)
Creatinine, Ser: 1.33 mg/dL — ABNORMAL HIGH (ref 0.57–1.00)
GFR calc Af Amer: 55 mL/min/{1.73_m2} — ABNORMAL LOW (ref >59)
GFR calc non Af Amer: 48 mL/min/{1.73_m2} — ABNORMAL LOW (ref >59)
Globulin, Total: 2.2 g/dL (ref 1.5–4.5)
Glucose: 153 mg/dL — ABNORMAL HIGH (ref 65–99)
Potassium: 4.1 mmol/L (ref 3.5–5.2)
Sodium: 140 mmol/L (ref 134–144)
Total Protein: 6.3 g/dL (ref 6.0–8.5)

## 2019-04-23 LAB — VITAMIN D 25 HYDROXY (VIT D DEFICIENCY, FRACTURES): Vit D, 25-Hydroxy: 14.9 ng/mL — ABNORMAL LOW (ref 30.0–100.0)

## 2019-04-23 LAB — TSH: TSH: 0.619 u[IU]/mL (ref 0.450–4.500)

## 2019-04-23 LAB — HEMOGLOBIN A1C
Est. average glucose Bld gHb Est-mCnc: 157 mg/dL
Hgb A1c MFr Bld: 7.1 % — ABNORMAL HIGH (ref 4.8–5.6)

## 2019-04-23 LAB — T4, FREE: Free T4: 1.91 ng/dL — ABNORMAL HIGH (ref 0.82–1.77)

## 2019-04-28 ENCOUNTER — Other Ambulatory Visit: Payer: Self-pay | Admitting: "Endocrinology

## 2019-04-28 DIAGNOSIS — E1142 Type 2 diabetes mellitus with diabetic polyneuropathy: Secondary | ICD-10-CM

## 2019-04-30 DIAGNOSIS — Z1231 Encounter for screening mammogram for malignant neoplasm of breast: Secondary | ICD-10-CM | POA: Diagnosis not present

## 2019-04-30 NOTE — Telephone Encounter (Signed)
Called in Rx refill for Bydureon.

## 2019-05-01 ENCOUNTER — Ambulatory Visit (INDEPENDENT_AMBULATORY_CARE_PROVIDER_SITE_OTHER): Payer: Medicaid Other | Admitting: "Endocrinology

## 2019-05-01 ENCOUNTER — Encounter: Payer: Medicaid Other | Attending: "Endocrinology | Admitting: Nutrition

## 2019-05-01 ENCOUNTER — Encounter: Payer: Self-pay | Admitting: "Endocrinology

## 2019-05-01 ENCOUNTER — Other Ambulatory Visit: Payer: Self-pay

## 2019-05-01 VITALS — BP 122/82 | HR 93 | Ht 61.0 in | Wt 191.0 lb

## 2019-05-01 DIAGNOSIS — N1832 Chronic kidney disease, stage 3b: Secondary | ICD-10-CM | POA: Diagnosis not present

## 2019-05-01 DIAGNOSIS — E1121 Type 2 diabetes mellitus with diabetic nephropathy: Secondary | ICD-10-CM | POA: Insufficient documentation

## 2019-05-01 DIAGNOSIS — E782 Mixed hyperlipidemia: Secondary | ICD-10-CM | POA: Diagnosis not present

## 2019-05-01 DIAGNOSIS — E1142 Type 2 diabetes mellitus with diabetic polyneuropathy: Secondary | ICD-10-CM | POA: Insufficient documentation

## 2019-05-01 DIAGNOSIS — I10 Essential (primary) hypertension: Secondary | ICD-10-CM

## 2019-05-01 DIAGNOSIS — E1165 Type 2 diabetes mellitus with hyperglycemia: Secondary | ICD-10-CM | POA: Diagnosis not present

## 2019-05-01 DIAGNOSIS — E785 Hyperlipidemia, unspecified: Secondary | ICD-10-CM | POA: Diagnosis not present

## 2019-05-01 DIAGNOSIS — E039 Hypothyroidism, unspecified: Secondary | ICD-10-CM

## 2019-05-01 DIAGNOSIS — E781 Pure hyperglyceridemia: Secondary | ICD-10-CM | POA: Insufficient documentation

## 2019-05-01 MED ORDER — TRULICITY 1.5 MG/0.5ML ~~LOC~~ SOAJ: 1.5000 mg | SUBCUTANEOUS | 2 refills | Status: DC

## 2019-05-01 MED ORDER — GLIPIZIDE ER 2.5 MG PO TB24: 2.5000 mg | ORAL_TABLET | Freq: Every day | ORAL | 3 refills | Status: DC

## 2019-05-01 MED ORDER — VITAMIN D (ERGOCALCIFEROL) 1.25 MG (50000 UNIT) PO CAPS: 50000.0000 [IU] | ORAL_CAPSULE | ORAL | 0 refills | Status: DC

## 2019-05-01 NOTE — Progress Notes (Signed)
Diabetes Self-Management Education  Visit Type:   Follow up  Appt. Start Time 1015 . End Time: 1045 05/01/2019  Diane Morgan, identified by name and date of birth, is a 49 y.o. female with a new diagnosis of Diabetes Type 2:  .    CKD and Hyperlipidemia. A1C up to 7.1% from 6.9%. She has been working on eating more fresh fruits, vegetables and drinking water. She is eating meals on time. Cut out snacks and processed foods. She is frustarted she didn't lose any weight.   She is exercising on treadmill and walking some. Willing to increase time and distance.    Had trouble with her Bydureon pen. Going to change to Trulicity if her insurance covers. Saw Dr. Fransico Him today. Glipizide 2.5 mg a day.   ASSESSMENT CMP Latest Ref Rng & Units 04/22/2019 01/25/2019 01/07/2019  Glucose 65 - 99 mg/dL 782(U) 235(T) 614(E)  BUN 6 - 24 mg/dL 13 19 17   Creatinine 0.57 - 1.00 mg/dL ) 3.15(Q) 0.08(Q)  Sodium 134 - 144 mmol/L 140 140 139  Potassium 3.5 - 5.2 mmol/L 4.1 4.0 3.9  Chloride 96 - 106 mmol/L 105 105 103  CO2 20 - 29 mmol/L 22 20 22   Calcium 8.7 - 10.2 mg/dL 9.3 9.1 7.61(P)  Total Protein 6.0 - 8.5 g/dL 6.3 ) -  Total Bilirubin 0.0 - 1.2 mg/dL 0.5 0.5 -  Alkaline Phos 39 - 117 IU/L 76 65 -  AST 0 - 40 IU/L 23 21 -  ALT 0 - 32 IU/L 20 16 -   Lab Results  Component Value Date   HGBA1C 7.1 (H) 04/22/2019     Lipid Panel     Component Value Date/Time   CHOL 88 (L) 01/25/2019 1002   CHOL 85 08/17/2012 1031   TRIG 151 (H) 01/25/2019 1002   TRIG 161 (H) 08/17/2012 1031   HDL 31 (L) 01/25/2019 1002   HDL 26 (L) 08/17/2012 1031   CHOLHDL 2.8 01/25/2019 1002   CHOLHDL 10.3 11/29/2012 1030   VLDL 39 11/29/2012 1030   LDLCALC 31 01/25/2019 1002   LDLCALC 27 08/17/2012 1031   Breakfast: 1 slice ww toast with margarine and 2 eggs Lunch: broccoli, peppers, cabbage, cottage cheese, cherries 10, water Dinner: Chef salad, broccoli and cauliflower and 1 tbsp ranch  dressing  Individualized Plan for Diabetes Self-Management Training:   Learning Objective:  Patient will have a greater understanding of diabetes self-management. Patient education plan is to attend individual and/or group sessions per assessed needs and concerns.   Plan:    Goals Keep up the great job! Keep drinking water. 1. Be sure to eat three meals per day 2.  Continue to increase high fiber foods. 3. Continue to increase exercise to 60 minutes a day. 4. Lose 3 lbs per month  Expected Outcomes:    Improved self management of her DM.  Education material provided: My Plate and Carbohydrate counting sheet  If problems or questions, patient to contact team via:  Phone and Email  Future DSME appointment:   3 months.

## 2019-05-01 NOTE — Progress Notes (Signed)
05/01/2019, 11:44 AM        Endocrinology follow-up note    Subjective:    Patient ID: Diane Morgan, female    DOB: [birth year].  Diane Morgan is being iseen in follow-up for management of currently controlled symptomatic type 2 diabetes, hypothyroidism, hyperlipidemia, hypertension. PMD:  Dettinger, Fransisca Kaufmann, MD.   Past Medical History:  Diagnosis Date  . Anemia   . Arthritis   . Cataract    right  . Cellulitis and abscess of trunk 11/28/2012  . Diabetic peripheral neuropathy ([place])   . DM (diabetes mellitus) ([place])   . Eczema   . GERD (gastroesophageal reflux disease)   . Hyperlipidemia   . Hypertension   . Hypothyroidism   . Leg pain, bilateral 10/08/09  . NIDDM (non-insulin dependent diabetes mellitus)   . URI (upper respiratory infection) 07/01/10   Past Surgical History:  Procedure Laterality Date  . CATARACT EXTRACTION W/PHACO Right 06/12/2017   Procedure: CATARACT EXTRACTION WITH PHACOEMULSIFICATION  AND INTRAOCULAR LENS PLACEMENT RIGHT EYE;  Surgeon: Tonny Branch, MD;  Location: [facility];  Service: Ophthalmology;  Laterality: Right;  CDE: 1.16  . CHOLECYSTECTOMY    . EYE SURGERY     right catracts  . LASER ABLATION CONDOLAMATA N/A 03/08/2017   Procedure: LASER ABLATION OF THE CERVIX;  Surgeon: Florian Buff, MD;  Location: [facility];  Service: Gynecology;  Laterality: N/A;  . WISDOM TOOTH EXTRACTION     Social History   Socioeconomic History  . Marital status: Single    Spouse name: Not on file  . Number of children: Not on file  . Years of education: Not on file  . Highest education level: Not on file  Occupational History  . Not on file  Tobacco Use  . Smoking status: Current Every Day Smoker    Years: 1.00    Types: Cigarettes  . Smokeless tobacco: Never Used  . Tobacco comment: smokes 4 cig daily  Substance and Sexual Activity  . Alcohol use: No  . Drug use: No  . Sexual activity: Not Currently    Birth  control/protection: None  Other Topics Concern  . Not on file  Social History Narrative  . Not on file   Social Determinants of Health   Financial Resource Strain:   . Difficulty of Paying Living Expenses: Not on file  Food Insecurity:   . Worried About Charity fundraiser in the Last Year: Not on file  . Ran Out of Food in the Last Year: Not on file  Transportation Needs:   . Lack of Transportation (Medical): Not on file  . Lack of Transportation (Non-Medical): Not on file  Physical Activity:   . Days of Exercise per Week: Not on file  . Minutes of Exercise per Session: Not on file  Stress:   . Feeling of Stress : Not on file  Social Connections:   . Frequency of Communication with Friends and Family: Not on file  . Frequency of Social Gatherings with Friends and Family: Not on file  . Attends Religious Services: Not on file  . Active Member of Clubs or Organizations: Not on file  . Attends Archivist Meetings: Not on file  . Marital Status: Not on file   Outpatient Encounter Medications as of 05/01/2019  Medication Sig  . levothyroxine (SYNTHROID) 75 MCG tablet Take 75 mcg by mouth daily before breakfast.  . aspirin 81 MG chewable tablet Chew  81 mg by mouth daily.    Marland Kitchen atorvastatin (LIPITOR) 40 MG tablet Take 1 tablet (40 mg total) by mouth daily.  . Blood Glucose Monitoring Suppl (ACCU-CHEK AVIVA PLUS) w/Device KIT USE AS DIRECTED  . cetirizine (ZYRTEC) 10 MG tablet Take 10 mg by mouth at bedtime.   . cycloSPORINE (RESTASIS) 0.05 % ophthalmic emulsion 1 drop 2 (two) times daily.  . Dulaglutide (TRULICITY) 1.5 XL/2.4MW SOPN Inject 1.5 mg into the skin once a week.  . fenofibrate (TRICOR) 145 MG tablet Take 1 tablet (145 mg total) by mouth daily.  Marland Kitchen gabapentin (NEURONTIN) 300 MG capsule Take 1 capsule by mouth once daily at bedtime  . glipiZIDE (GLUCOTROL XL) 2.5 MG 24 hr tablet Take 1 tablet (2.5 mg total) by mouth daily with breakfast.  . glucose blood (ACCU-CHEK  AVIVA) test strip Use to test glucose 4 times a day.  . ibuprofen (ADVIL,MOTRIN) 200 MG tablet Take 400 mg by mouth every 6 (six) hours as needed for headache or moderate pain.  . Vitamin D, Ergocalciferol, (DRISDOL) 1.25 MG (50000 UNIT) CAPS capsule Take 1 capsule (50,000 Units total) by mouth every 7 (seven) days.  . [DISCONTINUED] BYDUREON 2 MG PEN INJECT 2MG DOSE SUBCUTANEOUSLY ONCE A WEEK  . [DISCONTINUED] levothyroxine (SYNTHROID) 75 MCG tablet Take 50 mcg by mouth daily before breakfast.   . [DISCONTINUED] omeprazole (PRILOSEC) 40 MG capsule Take 40 mg by mouth daily.   No facility-administered encounter medications on file as of 05/01/2019.    ALLERGIES: Allergies  Allergen Reactions  . Penicillins Anaphylaxis, Hives and Other (See Comments)    Childhood Reaction. Has patient had a PCN reaction causing immediate rash, facial/tongue/throat swelling, SOB or lightheadedness with hypotension: Yes Has patient had a PCN reaction causing severe rash involving mucus membranes or skin necrosis: No Has patient had a PCN reaction that required hospitalization: No Has patient had a PCN reaction occurring within the last 10 years: No If all of the above answers are "NO", then may proceed with Cephalosporin use.   . Niaspan [Niacin] Other (See Comments)    Patient passes out.    VACCINATION STATUS: Immunization History  Administered Date(s) Administered  . Influenza,inj,Quad PF,6+ Mos 11/28/2012, 01/22/2014, 12/26/2014, 12/07/2015, 12/15/2016, 01/02/2018, 01/07/2019  . Pneumococcal Polysaccharide-23 01/07/2019  . Tdap 03/19/2013    Diabetes She presents for her follow-up diabetic visit. She has type 2 diabetes mellitus. Onset time: She was diagnosed at approximate age of 49years. Her disease course has been stable. There are no hypoglycemic associated symptoms. Pertinent negatives for hypoglycemia include no confusion, headaches, pallor or seizures. Pertinent negatives for diabetes include  no blurred vision, no chest pain, no fatigue, no polydipsia, no polyphagia and no polyuria. There are no hypoglycemic complications. Symptoms are stable. Diabetic complications include nephropathy. Risk factors for coronary artery disease include dyslipidemia, diabetes mellitus, family history, obesity, hypertension, sedentary lifestyle and tobacco exposure. Current diabetic treatments: She is on Bydureon 2 mg subcutaneously weekly. She reports that she has problem using the Bydureon pen.  She is asking for replacement prescription.  Her weight has been increasing.   She is following a generally unhealthy diet. When asked about meal planning, she reported none.  . She rarely participates in exercise. An ACE inhibitor/angiotensin II receptor blocker is not being taken. She does not see a podiatrist.Eye exam is current (Status post cataract extraction.).   Hyperlipidemia  This is a chronic problem. The problem is uncontrolled. Recent lipid tests were reviewed and are variable. Exacerbating diseases include  diabetes, hypothyroidism and obesity. Pertinent negatives include no chest pain, myalgias or shortness of breath. Current antihyperlipidemic treatment includes statins. Risk factors for coronary artery disease include diabetes mellitus, dyslipidemia, obesity, hypertension, a sedentary lifestyle and family history.  Hypertension This is a chronic problem. The current episode started more than 1 year ago. The problem is controlled. Pertinent negatives include no blurred vision, chest pain, headaches, palpitations or shortness of breath. Risk factors for coronary artery disease include dyslipidemia, diabetes mellitus, obesity, sedentary lifestyle and smoking/tobacco exposure. Hypertensive end-organ damage includes kidney disease.    Review of systems  Constitutional: + Minimally fluctuating body weight,  current  Body mass index is 36.09 kg/m. , no fatigue, no subjective hyperthermia, no subjective  hypothermia Eyes: no blurry vision, no xerophthalmia ENT: no sore throat, no nodules palpated in throat, no dysphagia/odynophagia, no hoarseness Cardiovascular: no Chest Pain, no Shortness of Breath, no palpitations, no leg swelling Respiratory: no cough, no shortness of breath Gastrointestinal: no Nausea/Vomiting/Diarhhea Musculoskeletal: no muscle/joint aches Skin: no rashes, no hyperemia Neurological: no tremors, no numbness, no tingling, no dizziness Psychiatric: no depression, no anxiety   Objective:    BP 122/82   Pulse 93   Ht 5' 1"  (1.549 m)   Wt 191 lb (86.6 kg)   BMI 36.09 kg/m   Wt Readings from Last 3 Encounters:  05/01/19 191 lb (86.6 kg)  01/25/19 186 lb 9.6 oz (84.6 kg)  01/07/19 187 lb 9.6 oz (85.1 kg)     Physical Exam- Limited  Constitutional:  Body mass index is 36.09 kg/m. , not in acute distress, normal state of mind Eyes:  EOMI, no exophthalmos Neck: Supple Thyroid: No gross goiter Respiratory: Adequate breathing efforts Musculoskeletal: no gross deformities, strength intact in all four extremities, no gross restriction of joint movements Skin:  no rashes, no hyperemia Neurological: no tremor with outstretched hands,    CMP     Component Value Date/Time   NA 140 04/22/2019 0000   K 4.1 04/22/2019 0000   CL 105 04/22/2019 0000   CO2 22 04/22/2019 0000   GLUCOSE 153 (H) 04/22/2019 0000   GLUCOSE 438 (H) 06/06/2017 0811   BUN 13 04/22/2019 0000   CREATININE 1.33 (H) 04/22/2019 0000   CREATININE 1.13 (H) 08/17/2012 1031   CALCIUM 9.3 04/22/2019 0000   PROT 6.3 04/22/2019 0000   ALBUMIN 4.1 04/22/2019 0000   AST 23 04/22/2019 0000   ALT 20 04/22/2019 0000   ALKPHOS 76 04/22/2019 0000   BILITOT 0.5 04/22/2019 0000   GFRNONAA 48 (L) 04/22/2019 0000   GFRNONAA 61 08/17/2012 1031   GFRAA 55 (L) 04/22/2019 0000   GFRAA 70 08/17/2012 1031     Diabetic Labs (most recent): Lab Results  Component Value Date   HGBA1C 7.1 (H) 04/22/2019    HGBA1C 6.4 01/25/2019   HGBA1C 6.2 (H) 10/15/2018     Lipid Panel ( most recent) Lipid Panel     Component Value Date/Time   CHOL 88 (L) 01/25/2019 1002   CHOL 85 08/17/2012 1031   TRIG 151 (H) 01/25/2019 1002   TRIG 161 (H) 08/17/2012 1031   HDL 31 (L) 01/25/2019 1002   HDL 26 (L) 08/17/2012 1031   CHOLHDL 2.8 01/25/2019 1002   CHOLHDL 10.3 11/29/2012 1030   VLDL 39 11/29/2012 1030   LDLCALC 31 01/25/2019 1002   LDLCALC 27 08/17/2012 1031     Lab Results  Component Value Date   TSH 0.619 04/22/2019   TSH 0.477 01/25/2019  TSH 0.854 10/15/2018   TSH 1.130 04/16/2018   TSH 0.346 (L) 01/02/2018   TSH 0.637 09/04/2017   TSH 1.820 12/15/2016   TSH 1.760 12/07/2015   TSH 3.780 03/27/2015   TSH 4.950 (H) 11/14/2014   FREET4 1.91 (H) 04/22/2019   FREET4 1.69 10/15/2018   FREET4 1.43 04/16/2018   FREET4 1.68 01/02/2018   FREET4 1.52 09/04/2017   FREET4 1.19 11/14/2014      Assessment & Plan:   1. Type 2 diabetes mellitus with stage 3 chronic kidney disease, without long-term current use of insulin ([place])  - Diane Morgan has currently uncontrolled symptomatic type 2 DM since 49 years of age. -She came with continued control of diabetes with A1c of 7.1%, although its increasing from her last visit A1c of 6.2%.     -  Her recent labs are reviewed showing improving renal function.    -her diabetes is complicated by obesity/sedentary life, CKD, smoking, and Diane Morgan remains at a high risk for more acute and chronic complications which include CAD, CVA, CKD, retinopathy, and neuropathy. These are all discussed in detail with the patient.  - I have counseled her on diet management and weight loss, by adopting a carbohydrate restricted/protein rich diet.  - she  admits there is a room for improvement in her diet and drink choices, as well as in her exercise habits. -  Suggestion is made for her to avoid simple carbohydrates  from her diet including Cakes, Sweet  Desserts / Pastries, Ice Cream, Soda (diet and regular), Sweet Tea, Candies, Chips, Cookies, Sweet Pastries,  Store Bought Juices, Alcohol in Excess of  1-2 drinks a day, Artificial Sweeteners, Coffee Creamer, and "Sugar-free" Products. This will help patient to have stable blood glucose profile and potentially avoid unintended weight gain.   - I encouraged her to switch to  unprocessed or minimally processed complex starch and increased protein intake (animal or plant source), fruits, and vegetables.  - she is advised to stick to a routine mealtimes to eat 3 meals  a day and avoid unnecessary snacks ( to snack only to correct hypoglycemia).    - I have approached her with the following individualized plan to manage diabetes and patient agrees:   -Based on her report of glycemia to near target, and A1c of 7.1%, she will not need insulin treatment for now.    -She is looking for options other than Bydureon.  I discussed and initiated Trulicity 1.5 mg subcutaneously weekly.   -She will continue to finish her current supplies of Bydureon by taking 2 mg subcutaneously weekly.  -Patient seems to have significant cognitive deficit, maximizing non-insulin therapeutic options would be entertained before considering insulin treatment.  -I discussed and added glipizide 2.5 mg XL p.o. daily at breakfast. -If her renal function continues to improve, she will be considered for low-dose metformin on subsequent visits. -Patient is encouraged to call clinic for blood glucose levels less than 70 or above 200 mg /dl.  - Patient specific target  A1c;  LDL, HDL, Triglycerides, and  Waist Circumference were discussed in detail.  2) BP/HTN:  Her blood pressure is controlled to target.  She is not on any antihypertensive medications.    3) Lipids/HPL:   Her recent lipid panel showed hypertriglyceridemia and controlled LDL at 27.  She is advised to continue atorvastatin 20 mg p.o. nightly.     4)  Weight/Diet:  CDE Consult has been  initiated , exercise, and detailed carbohydrates  information provided.  5) hypothyroidism: -Diagnosed at approximate age of 89 years. -Her previsit labs are consistent with appropriate replacement.  She is advised to continue levothyroxine 75 g p.o. daily before breakfast.    - We discussed about the correct intake of her thyroid hormone, on empty stomach at fasting, with water, separated by at least 30 minutes from breakfast and other medications,  and separated by more than 4 hours from calcium, iron, multivitamins, acid reflux medications (PPIs). -Patient is made aware of the fact that thyroid hormone replacement is needed for life, dose to be adjusted by periodic monitoring of thyroid function tests.  6) Chronic Care/Health Maintenance:  -she  is on  Statin medications and  is encouraged to continue to follow up with Ophthalmology, Dentist,  Podiatrist at least yearly or according to recommendations, and advised to away from smoking.    I have recommended yearly flu vaccine and pneumonia vaccination at least every 5 years; moderate intensity exercise for up to 150 minutes weekly; and  sleep for at least 7 hours a day.   - I advised patient to maintain close follow up with Dettinger, Fransisca Kaufmann, MD for primary care needs.  - Time spent on this patient care encounter:  35 min, of which > 50% was spent in  counseling and the rest reviewing her blood glucose logs , discussing her hypoglycemia and hyperglycemia episodes, reviewing her current and  previous labs / studies  ( including abstraction from other facilities) and medications  doses and developing a  long term treatment plan and documenting her care.   Please refer to Patient Instructions for Blood Glucose Monitoring and Insulin/Medications Dosing Guide"  in media tab for additional information. Please  also refer to " Patient Self Inventory" in the Media  tab for reviewed elements of pertinent patient  history.  Diane Morgan participated in the discussions, expressed understanding, and voiced agreement with the above plans.  All questions were answered to her satisfaction. she is encouraged to contact clinic should she have any questions or concerns prior to her return visit.   Follow up plan: - Return in about 4 months (around 08/29/2019) for Bring Meter and Logs- A1c in Office.  Glade Lloyd, MD [facility] [address] [place], [place] [address] Phone: [phone]  Fax: [phone]    05/01/2019, 11:44 AM  This note was partially dictated with voice recognition software. Similar sounding words can be transcribed inadequately or may not  be corrected upon review.

## 2019-05-01 NOTE — Patient Instructions (Signed)

## 2019-05-02 ENCOUNTER — Encounter: Payer: Self-pay | Admitting: Nutrition

## 2019-05-02 NOTE — Patient Instructions (Signed)
Plan:    Goals Keep up the great job! Keep drinking water. 1. Be sure to eat three meals per day 2.  Continue to increase high fiber foods. 3. Continue to increase exercise to 60 minutes a day. 4. Lose 3 lbs per month

## 2019-05-27 ENCOUNTER — Other Ambulatory Visit: Payer: Self-pay | Admitting: "Endocrinology

## 2019-05-27 ENCOUNTER — Telehealth: Payer: Self-pay | Admitting: "Endocrinology

## 2019-05-27 NOTE — Telephone Encounter (Signed)
Pt is calling stating that she has been waiting to see if her Truclitiy has been approved. Please advise. Call Lottie at [phone]

## 2019-05-27 NOTE — Telephone Encounter (Signed)
Pt states at her last visit she discussed trying to change bydureon to trulicity. States she took her last injection of bydureon Sunday and the pharmacy had a rx refill for bydureon but did not know anything about the trulicity.

## 2019-05-27 NOTE — Telephone Encounter (Signed)
Trulicity is what  I ordered for her.  It is likely that pharmacist  is filling  Bydureon prescribed by another provider.  The order for Trulicity is still in place.

## 2019-05-28 ENCOUNTER — Other Ambulatory Visit: Payer: Self-pay | Admitting: "Endocrinology

## 2019-05-28 NOTE — Telephone Encounter (Signed)
[facility] they stated they did not receive the rx for trulicity, gave a verbal order for trulicity.

## 2019-07-26 ENCOUNTER — Other Ambulatory Visit: Payer: Self-pay | Admitting: "Endocrinology

## 2019-07-26 ENCOUNTER — Other Ambulatory Visit: Payer: Self-pay

## 2019-07-26 ENCOUNTER — Encounter: Payer: Self-pay | Admitting: Family Medicine

## 2019-07-26 ENCOUNTER — Ambulatory Visit: Payer: Medicaid Other | Admitting: Family Medicine

## 2019-07-26 VITALS — BP 112/77 | HR 80 | Temp 97.9°F | Ht 61.0 in | Wt 188.2 lb

## 2019-07-26 DIAGNOSIS — E039 Hypothyroidism, unspecified: Secondary | ICD-10-CM

## 2019-07-26 DIAGNOSIS — I10 Essential (primary) hypertension: Secondary | ICD-10-CM | POA: Diagnosis not present

## 2019-07-26 DIAGNOSIS — E781 Pure hyperglyceridemia: Secondary | ICD-10-CM

## 2019-07-26 DIAGNOSIS — E1121 Type 2 diabetes mellitus with diabetic nephropathy: Secondary | ICD-10-CM | POA: Diagnosis not present

## 2019-07-26 DIAGNOSIS — E1142 Type 2 diabetes mellitus with diabetic polyneuropathy: Secondary | ICD-10-CM | POA: Diagnosis not present

## 2019-07-26 DIAGNOSIS — E1165 Type 2 diabetes mellitus with hyperglycemia: Secondary | ICD-10-CM

## 2019-07-26 DIAGNOSIS — N1832 Chronic kidney disease, stage 3b: Secondary | ICD-10-CM | POA: Diagnosis not present

## 2019-07-26 DIAGNOSIS — E782 Mixed hyperlipidemia: Secondary | ICD-10-CM

## 2019-07-26 LAB — BAYER DCA HB A1C WAIVED: HB A1C (BAYER DCA - WAIVED): 5.4 % (ref <7.0)

## 2019-07-26 NOTE — Progress Notes (Signed)
BP 112/77   Pulse 80   Temp 97.9 F (36.6 C) (Temporal)   Ht 5' 1"  (1.549 m)   Wt 188 lb 4 oz (85.4 kg)   BMI 35.57 kg/m    Subjective:   Patient ID: Diane Morgan, female    DOB: [birth year], 49 y.o.   MRN: [id number]  HPI: Diane Morgan is a 49 y.o. female presenting on 07/26/2019 for Medical Management of Chronic Issues   HPI Type 2 diabetes mellitus Patient comes in today for recheck of his diabetes. Patient has been currently taking Trulicity and glipizide, sees Dr. Dorien Chihuahua, A1c 5.4. Patient is not currently on an ACE inhibitor/ARB because of how low blood pressure is, she does not tolerate a low-dose ACE inhibitor. Patient has not seen an ophthalmologist this year. Patient denies any issues with their feet, does have a small blister on the inside of her right second toe from mowing the lawn this last week, no sign of infection. The symptom started onset as an adult thyroid and hyperlipidemia and hypertension and CKD stage III and peripheral neuropathy ARE RELATED TO DM   Hypothyroidism recheck Patient is coming in for thyroid recheck today as well. They deny any issues with hair changes or heat or cold problems or diarrhea or constipation. They deny any chest pain or palpitations. They are currently on levothyroxine 75 micrograms   Hyperlipidemia Patient is coming in for recheck of his hyperlipidemia. The patient is currently taking atorvastatin. They deny any issues with myalgias or history of liver damage from it. They deny any focal numbness or weakness or chest pain.   Hypertension Patient is currently on no medication currently, and their blood pressure today is 112/77. Patient denies any lightheadedness or dizziness. Patient denies headaches, blurred vision, chest pains, shortness of breath, or weakness. Denies any side effects from medication and is content with current medication.   Relevant past medical, surgical, family and social history reviewed and updated as  indicated. Interim medical history since our last visit reviewed. Allergies and medications reviewed and updated.  Review of Systems  Constitutional: Negative for chills and fever.  Eyes: Negative for visual disturbance.  Respiratory: Negative for chest tightness and shortness of breath.   Cardiovascular: Negative for chest pain and leg swelling.  Musculoskeletal: Negative for back pain and gait problem.  Skin: Negative for rash.  Neurological: Negative for light-headedness and headaches.  Psychiatric/Behavioral: Negative for agitation and behavioral problems.  All other systems reviewed and are negative.   Per HPI unless specifically indicated above   Allergies as of 07/26/2019      Reactions   Penicillins Anaphylaxis, Hives, Other (See Comments)   Childhood Reaction. Has patient had a PCN reaction causing immediate rash, facial/tongue/throat swelling, SOB or lightheadedness with hypotension: Yes Has patient had a PCN reaction causing severe rash involving mucus membranes or skin necrosis: No Has patient had a PCN reaction that required hospitalization: No Has patient had a PCN reaction occurring within the last 10 years: No If all of the above answers are "NO", then may proceed with Cephalosporin use.   Niaspan [niacin] Other (See Comments)   Patient passes out.      Medication List       Accurate as of Jul 26, 2019  8:55 AM. If you have any questions, ask your nurse or doctor.        Accu-Chek Aviva Plus w/Device Kit USE AS DIRECTED   aspirin 81 MG chewable tablet Chew 81 mg by  mouth daily.   atorvastatin 40 MG tablet Commonly known as: LIPITOR Take 1 tablet (40 mg total) by mouth daily.   cetirizine 10 MG tablet Commonly known as: ZYRTEC Take 10 mg by mouth at bedtime.   cycloSPORINE 0.05 % ophthalmic emulsion Commonly known as: RESTASIS 1 drop 2 (two) times daily.   fenofibrate 145 MG tablet Commonly known as: TRICOR Take 1 tablet (145 mg total) by mouth  daily.   gabapentin 300 MG capsule Commonly known as: NEURONTIN Take 1 capsule by mouth once daily at bedtime   glipiZIDE 2.5 MG 24 hr tablet Commonly known as: GLUCOTROL XL Take 1 tablet (2.5 mg total) by mouth daily with breakfast.   glucose blood test strip Commonly known as: Accu-Chek Aviva Use to test glucose 4 times a day.   ibuprofen 200 MG tablet Commonly known as: ADVIL Take 400 mg by mouth every 6 (six) hours as needed for headache or moderate pain.   levothyroxine 75 MCG tablet Commonly known as: SYNTHROID Take 1 tablet by mouth once daily   Trulicity 1.5 TD/3.2KG Sopn Generic drug: Dulaglutide Inject 1.5 mg into the skin once a week.   Vitamin D (Ergocalciferol) 1.25 MG (50000 UNIT) Caps capsule Commonly known as: DRISDOL Take 1 capsule (50,000 Units total) by mouth every 7 (seven) days.        Objective:   BP 112/77   Pulse 80   Temp 97.9 F (36.6 C) (Temporal)   Ht 5' 1"  (1.549 m)   Wt 188 lb 4 oz (85.4 kg)   BMI 35.57 kg/m   Wt Readings from Last 3 Encounters:  07/26/19 188 lb 4 oz (85.4 kg)  05/01/19 191 lb (86.6 kg)  01/25/19 186 lb 9.6 oz (84.6 kg)    Physical Exam Vitals and nursing note reviewed.  Constitutional:      General: She is not in acute distress.    Appearance: She is well-developed. She is not diaphoretic.  Eyes:     Conjunctiva/sclera: Conjunctivae normal.  Cardiovascular:     Rate and Rhythm: Normal rate and regular rhythm.     Heart sounds: Normal heart sounds. No murmur.  Pulmonary:     Effort: Pulmonary effort is normal. No respiratory distress.     Breath sounds: Normal breath sounds. No wheezing.  Musculoskeletal:        General: No tenderness. Normal range of motion.  Skin:    General: Skin is warm and dry.     Findings: No rash.  Neurological:     Mental Status: She is alert and oriented to person, place, and time.     Coordination: Coordination normal.  Psychiatric:        Behavior: Behavior normal.        Assessment & Plan:   Problem List Items Addressed This Visit      Cardiovascular and Mediastinum   Hypertension     Endocrine   Type 2 diabetes mellitus with stage 3 chronic kidney disease, without long-term current use of insulin ([place])   Relevant Orders   Lipid panel   Hypothyroidism   Relevant Orders   TSH   Type 2 diabetes mellitus with diabetic polyneuropathy ([place]) - Primary   Relevant Orders   Microalbumin / creatinine urine ratio   Bayer DCA Hb A1c Waived   CBC with Differential/Platelet   CMP14+EGFR   Uncontrolled type 2 diabetes mellitus with hyperglycemia ([facility])   Relevant Orders   CMP14+EGFR     Other   Hyperlipidemia  Morbid obesity ([place])   Hypertriglyceridemia   Relevant Orders   Lipid panel    Patient sees Dr. Dorien Chihuahua for endocrinology, will check blood work, A1c 5.4, recommended that possibly stopping the glipizide would be a good idea but they want to talk to Dr. Dorien Chihuahua first since he manages that.  Follow up plan: Return in about 6 months (around 01/26/2020), or if symptoms worsen or fail to improve, for Diabetes thyroid cholesterol.  Counseling provided for all of the vaccine components Orders Placed This Encounter  Procedures  . Microalbumin / creatinine urine ratio  . Bayer DCA Hb A1c Waived  . CBC with Differential/Platelet  . CMP14+EGFR  . Lipid panel  . TSH    Caryl Pina, MD [facility] 07/26/2019, 8:55 AM

## 2019-07-27 LAB — CMP14+EGFR
ALT: 12 IU/L (ref 0–32)
AST: 19 IU/L (ref 0–40)
Albumin/Globulin Ratio: 1.6 (ref 1.2–2.2)
Albumin: 3.6 g/dL — ABNORMAL LOW (ref 3.8–4.8)
Alkaline Phosphatase: 66 IU/L (ref 39–117)
BUN/Creatinine Ratio: 9 (ref 9–23)
BUN: 14 mg/dL (ref 6–24)
Bilirubin Total: 0.5 mg/dL (ref 0.0–1.2)
CO2: 21 mmol/L (ref 20–29)
Calcium: 9.1 mg/dL (ref 8.7–10.2)
Chloride: 106 mmol/L (ref 96–106)
Creatinine, Ser: 1.5 mg/dL — ABNORMAL HIGH (ref 0.57–1.00)
GFR calc Af Amer: 47 mL/min/{1.73_m2} — ABNORMAL LOW (ref >59)
GFR calc non Af Amer: 41 mL/min/{1.73_m2} — ABNORMAL LOW (ref >59)
Globulin, Total: 2.3 g/dL (ref 1.5–4.5)
Glucose: 102 mg/dL — ABNORMAL HIGH (ref 65–99)
Potassium: 4.2 mmol/L (ref 3.5–5.2)
Sodium: 142 mmol/L (ref 134–144)
Total Protein: 5.9 g/dL — ABNORMAL LOW (ref 6.0–8.5)

## 2019-07-27 LAB — CBC WITH DIFFERENTIAL/PLATELET
Basophils Absolute: 0.1 10*3/uL (ref 0.0–0.2)
Basos: 1 %
EOS (ABSOLUTE): 0.5 10*3/uL — ABNORMAL HIGH (ref 0.0–0.4)
Eos: 4 %
Hematocrit: 47.4 % — ABNORMAL HIGH (ref 34.0–46.6)
Hemoglobin: 15.6 g/dL (ref 11.1–15.9)
Immature Grans (Abs): 0 10*3/uL (ref 0.0–0.1)
Immature Granulocytes: 0 %
Lymphocytes Absolute: 3.9 10*3/uL — ABNORMAL HIGH (ref 0.7–3.1)
Lymphs: 33 %
MCH: 29.2 pg (ref 26.6–33.0)
MCHC: 32.9 g/dL (ref 31.5–35.7)
MCV: 89 fL (ref 79–97)
Monocytes Absolute: 0.7 10*3/uL (ref 0.1–0.9)
Monocytes: 6 %
Neutrophils Absolute: 6.4 10*3/uL (ref 1.4–7.0)
Neutrophils: 56 %
Platelets: 270 10*3/uL (ref 150–450)
RBC: 5.35 x10E6/uL — ABNORMAL HIGH (ref 3.77–5.28)
RDW: 13.2 % (ref 11.7–15.4)
WBC: 11.6 10*3/uL — ABNORMAL HIGH (ref 3.4–10.8)

## 2019-07-27 LAB — LIPID PANEL
Chol/HDL Ratio: 2.7 ratio (ref 0.0–4.4)
Cholesterol, Total: 79 mg/dL — ABNORMAL LOW (ref 100–199)
HDL: 29 mg/dL — ABNORMAL LOW (ref >39)
LDL Chol Calc (NIH): 23 mg/dL (ref 0–99)
Triglycerides: 158 mg/dL — ABNORMAL HIGH (ref 0–149)
VLDL Cholesterol Cal: 27 mg/dL (ref 5–40)

## 2019-07-27 LAB — MICROALBUMIN / CREATININE URINE RATIO
Creatinine, Urine: 345.1 mg/dL
Microalb/Creat Ratio: 7 mg/g creat (ref 0–29)
Microalbumin, Urine: 23.2 ug/mL

## 2019-07-27 LAB — TSH: TSH: 1.32 u[IU]/mL (ref 0.450–4.500)

## 2019-07-31 ENCOUNTER — Telehealth: Payer: Self-pay | Admitting: Family Medicine

## 2019-07-31 NOTE — Telephone Encounter (Signed)
Please review lab results.

## 2019-08-01 NOTE — Telephone Encounter (Signed)
Reviewed labs, see result note

## 2019-08-12 ENCOUNTER — Other Ambulatory Visit: Payer: Self-pay | Admitting: "Endocrinology

## 2019-08-26 ENCOUNTER — Other Ambulatory Visit: Payer: Self-pay | Admitting: Family Medicine

## 2019-08-28 ENCOUNTER — Other Ambulatory Visit: Payer: Self-pay | Admitting: "Endocrinology

## 2019-09-02 ENCOUNTER — Encounter: Payer: Self-pay | Admitting: "Endocrinology

## 2019-09-02 ENCOUNTER — Ambulatory Visit: Payer: Medicaid Other | Admitting: Nutrition

## 2019-09-02 ENCOUNTER — Other Ambulatory Visit: Payer: Self-pay

## 2019-09-02 ENCOUNTER — Ambulatory Visit (INDEPENDENT_AMBULATORY_CARE_PROVIDER_SITE_OTHER): Payer: Medicaid Other | Admitting: "Endocrinology

## 2019-09-02 VITALS — BP 109/74 | HR 73 | Ht 61.0 in | Wt 191.2 lb

## 2019-09-02 DIAGNOSIS — I10 Essential (primary) hypertension: Secondary | ICD-10-CM

## 2019-09-02 DIAGNOSIS — E039 Hypothyroidism, unspecified: Secondary | ICD-10-CM | POA: Diagnosis not present

## 2019-09-02 DIAGNOSIS — E1165 Type 2 diabetes mellitus with hyperglycemia: Secondary | ICD-10-CM

## 2019-09-02 DIAGNOSIS — E785 Hyperlipidemia, unspecified: Secondary | ICD-10-CM

## 2019-09-02 NOTE — Progress Notes (Signed)
09/02/2019, 12:36 PM        Endocrinology follow-up note    Subjective:    Patient ID: Diane Morgan, female    DOB: [birth year].  Diane Morgan is being iseen in follow-up for management of currently controlled symptomatic type 2 diabetes, hypothyroidism, hyperlipidemia, hypertension. PMD:  Dettinger, Fransisca Kaufmann, MD.    Past Medical History:  Diagnosis Date  . Anemia   . Arthritis   . Cataract    right  . Cellulitis and abscess of trunk 11/28/2012  . Diabetic peripheral neuropathy ([place])   . DM (diabetes mellitus) ([place])   . Eczema   . GERD (gastroesophageal reflux disease)   . Hyperlipidemia   . Hypertension   . Hypothyroidism   . Leg pain, bilateral 10/08/09  . NIDDM (non-insulin dependent diabetes mellitus)   . URI (upper respiratory infection) 07/01/10   Past Surgical History:  Procedure Laterality Date  . CATARACT EXTRACTION W/PHACO Right 06/12/2017   Procedure: CATARACT EXTRACTION WITH PHACOEMULSIFICATION  AND INTRAOCULAR LENS PLACEMENT RIGHT EYE;  Surgeon: Tonny Branch, MD;  Location: [facility];  Service: Ophthalmology;  Laterality: Right;  CDE: 1.16  . CHOLECYSTECTOMY    . EYE SURGERY     right catracts  . LASER ABLATION CONDOLAMATA N/A 03/08/2017   Procedure: LASER ABLATION OF THE CERVIX;  Surgeon: Florian Buff, MD;  Location: [facility];  Service: Gynecology;  Laterality: N/A;  . WISDOM TOOTH EXTRACTION     Social History   Socioeconomic History  . Marital status: Single    Spouse name: Not on file  . Number of children: Not on file  . Years of education: Not on file  . Highest education level: Not on file  Occupational History  . Not on file  Tobacco Use  . Smoking status: Current Every Day Smoker    Years: 1.00    Types: Cigarettes  . Smokeless tobacco: Never Used  . Tobacco comment: smokes 4 cig daily  Vaping Use  . Vaping Use: Never used  Substance and Sexual Activity  . Alcohol use: No  . Drug use: No  . Sexual activity:  Not Currently    Birth control/protection: None  Other Topics Concern  . Not on file  Social History Narrative  . Not on file   Social Determinants of Health   Financial Resource Strain:   . Difficulty of Paying Living Expenses:   Food Insecurity:   . Worried About Charity fundraiser in the Last Year:   . Arboriculturist in the Last Year:   Transportation Needs:   . Film/video editor (Medical):   Marland Kitchen Lack of Transportation (Non-Medical):   Physical Activity:   . Days of Exercise per Week:   . Minutes of Exercise per Session:   Stress:   . Feeling of Stress :   Social Connections:   . Frequency of Communication with Friends and Family:   . Frequency of Social Gatherings with Friends and Family:   . Attends Religious Services:   . Active Member of Clubs or Organizations:   . Attends Archivist Meetings:   Marland Kitchen Marital Status:    Outpatient Encounter Medications as of 09/02/2019  Medication Sig  . aspirin 81 MG chewable tablet Chew 81 mg by mouth daily.    Marland Kitchen atorvastatin (LIPITOR) 40 MG tablet Take 1 tablet (40 mg total) by mouth daily.  . Blood Glucose Monitoring Suppl (ACCU-CHEK AVIVA PLUS) w/Device  KIT USE AS DIRECTED  . cetirizine (ZYRTEC) 10 MG tablet Take 10 mg by mouth at bedtime.   . cycloSPORINE (RESTASIS) 0.05 % ophthalmic emulsion 1 drop 2 (two) times daily.  . fenofibrate (TRICOR) 145 MG tablet Take 1 tablet (145 mg total) by mouth daily.  Marland Kitchen gabapentin (NEURONTIN) 300 MG capsule Take 1 capsule by mouth once daily at bedtime  . glucose blood (ACCU-CHEK GUIDE) test strip Use as directed to check blood glucose three times daily  . ibuprofen (ADVIL,MOTRIN) 200 MG tablet Take 400 mg by mouth every 6 (six) hours as needed for headache or moderate pain.  Marland Kitchen levothyroxine (SYNTHROID) 75 MCG tablet Take 1 tablet by mouth once daily  . TRULICITY 1.5 GB/1.5VV SOPN INJECT 1.5 MG  SUBCUTANEOUSLY ONCE A WEEK  . Vitamin D, Ergocalciferol, (DRISDOL) 1.25 MG (50000 UNIT)  CAPS capsule Take 1 capsule (50,000 Units total) by mouth every 7 (seven) days.  . [DISCONTINUED] glipiZIDE (GLUCOTROL XL) 2.5 MG 24 hr tablet Take 1 tablet by mouth once daily with breakfast   No facility-administered encounter medications on file as of 09/02/2019.    ALLERGIES: Allergies  Allergen Reactions  . Penicillins Anaphylaxis, Hives and Other (See Comments)    Childhood Reaction. Has patient had a PCN reaction causing immediate rash, facial/tongue/throat swelling, SOB or lightheadedness with hypotension: Yes Has patient had a PCN reaction causing severe rash involving mucus membranes or skin necrosis: No Has patient had a PCN reaction that required hospitalization: No Has patient had a PCN reaction occurring within the last 10 years: No If all of the above answers are "NO", then may proceed with Cephalosporin use.   . Niaspan [Niacin] Other (See Comments)    Patient passes out.    VACCINATION STATUS: Immunization History  Administered Date(s) Administered  . Influenza,inj,Quad PF,6+ Mos 11/28/2012, 01/22/2014, 12/26/2014, 12/07/2015, 12/15/2016, 01/02/2018, 01/07/2019  . PFIZER SARS-COV-2 Vaccination 06/29/2019, 07/20/2019  . Pneumococcal Polysaccharide-23 01/07/2019  . Tdap 03/19/2013    Diabetes She presents for her follow-up diabetic visit. She has type 2 diabetes mellitus. Onset time: She was diagnosed at approximate age of 3years. Her disease course has been stable. She denies hypoglycemic episodes.  Her previsit labs show A1c of 5.4% consistent with tightly controlled diabetes.  Pertinent negatives for hypoglycemia include no confusion, headaches, pallor or seizures. Pertinent negatives for diabetes include no blurred vision, no chest pain, no fatigue, no polydipsia, no polyphagia and no polyuria. There are no hypoglycemic complications. Symptoms are stable. Diabetic complications include nephropathy. Risk factors for coronary artery disease include dyslipidemia,  diabetes mellitus, family history, obesity, hypertension, sedentary lifestyle and tobacco exposure. Current diabetic treatments: She is on Bydureon 2 mg subcutaneously weekly. She reports that she has problem using the Bydureon pen.  She is asking for replacement prescription.  Her weight has been increasing.   She is following a generally unhealthy diet. When asked about meal planning, she reported none.  . She rarely participates in exercise. An ACE inhibitor/angiotensin II receptor blocker is not being taken. She does not see a podiatrist.Eye exam is current (Status post cataract extraction.).   Hyperlipidemia  This is a chronic problem. The problem is uncontrolled. Recent lipid tests were reviewed and are variable. Exacerbating diseases include diabetes, hypothyroidism and obesity. Pertinent negatives include no chest pain, myalgias or shortness of breath. Current antihyperlipidemic treatment includes statins. Risk factors for coronary artery disease include diabetes mellitus, dyslipidemia, obesity, hypertension, a sedentary lifestyle and family history.  Hypertension This is a chronic problem. The  current episode started more than 1 year ago. The problem is controlled. Pertinent negatives include no blurred vision, chest pain, headaches, palpitations or shortness of breath. Risk factors for coronary artery disease include dyslipidemia, diabetes mellitus, obesity, sedentary lifestyle and smoking/tobacco exposure. Hypertensive end-organ damage includes kidney disease.    Review of systems  Constitutional: + Minimally fluctuating body weight,  current  Body mass index is 36.13 kg/m. , no fatigue, no subjective hyperthermia, no subjective hypothermia Eyes: no blurry vision, no xerophthalmia ENT: no sore throat, no nodules palpated in throat, no dysphagia/odynophagia, no hoarseness Cardiovascular: no Chest Pain, no Shortness of Breath, no palpitations, no leg swelling Respiratory: no cough, no  shortness of breath Gastrointestinal: no Nausea/Vomiting/Diarhhea Musculoskeletal: no muscle/joint aches Skin: no rashes, no hyperemia Neurological: no tremors, no numbness, no tingling, no dizziness Psychiatric: no depression, no anxiety    Objective:    BP 109/74   Pulse 73   Ht 5' 1"  (1.549 m)   Wt 191 lb 3.2 oz (86.7 kg)   BMI 36.13 kg/m   Wt Readings from Last 3 Encounters:  09/02/19 191 lb 3.2 oz (86.7 kg)  07/26/19 188 lb 4 oz (85.4 kg)  05/01/19 191 lb (86.6 kg)     Physical Exam- Limited  Constitutional:  Body mass index is 36.13 kg/m. , not in acute distress, normal state of mind Eyes:  EOMI, no exophthalmos Neck: Supple Thyroid: No gross goiter Respiratory: Adequate breathing efforts Musculoskeletal: no gross deformities, strength intact in all four extremities, no gross restriction of joint movements Skin:  no rashes, no hyperemia Neurological: no tremor with outstretched hands,    CMP     Component Value Date/Time   NA 142 07/26/2019 0825   K 4.2 07/26/2019 0825   CL 106 07/26/2019 0825   CO2 21 07/26/2019 0825   GLUCOSE 102 (H) 07/26/2019 0825   GLUCOSE 438 (H) 06/06/2017 0811   BUN 14 07/26/2019 0825   CREATININE 1.50 (H) 07/26/2019 0825   CREATININE 1.13 (H) 08/17/2012 1031   CALCIUM 9.1 07/26/2019 0825   PROT 5.9 (L) 07/26/2019 0825   ALBUMIN 3.6 (L) 07/26/2019 0825   AST 19 07/26/2019 0825   ALT 12 07/26/2019 0825   ALKPHOS 66 07/26/2019 0825   BILITOT 0.5 07/26/2019 0825   GFRNONAA 41 (L) 07/26/2019 0825   GFRNONAA 61 08/17/2012 1031   GFRAA 47 (L) 07/26/2019 0825   GFRAA 70 08/17/2012 1031     Diabetic Labs (most recent): Lab Results  Component Value Date   HGBA1C 5.4 07/26/2019   HGBA1C 7.1 (H) 04/22/2019   HGBA1C 6.4 01/25/2019     Lipid Panel ( most recent) Lipid Panel     Component Value Date/Time   CHOL 79 (L) 07/26/2019 0825   CHOL 85 08/17/2012 1031   TRIG 158 (H) 07/26/2019 0825   TRIG 161 (H) 08/17/2012 1031    HDL 29 (L) 07/26/2019 0825   HDL 26 (L) 08/17/2012 1031   CHOLHDL 2.7 07/26/2019 0825   CHOLHDL 10.3 11/29/2012 1030   VLDL 39 11/29/2012 1030   LDLCALC 23 07/26/2019 0825   LDLCALC 27 08/17/2012 1031     Lab Results  Component Value Date   TSH 1.320 07/26/2019   TSH 0.619 04/22/2019   TSH 0.477 01/25/2019   TSH 0.854 10/15/2018   TSH 1.130 04/16/2018   TSH 0.346 (L) 01/02/2018   TSH 0.637 09/04/2017   TSH 1.820 12/15/2016   TSH 1.760 12/07/2015   TSH 3.780 03/27/2015   FREET4  1.91 (H) 04/22/2019   FREET4 1.69 10/15/2018   FREET4 1.43 04/16/2018   FREET4 1.68 01/02/2018   FREET4 1.52 09/04/2017   FREET4 1.19 11/14/2014      Assessment & Plan:   1. Type 2 diabetes mellitus with stage 3 chronic kidney disease, without long-term current use of insulin ([place])  - Diane Morgan has currently uncontrolled symptomatic type 2 DM since 49 years of age. -She came with continued control of diabetes with A1c of 5.4%, improving from 7.1% during her last visit.      Her recent labs are reviewed showing improving renal function.    -her diabetes is complicated by obesity/sedentary life, CKD, smoking, and Diane Morgan remains at a high risk for more acute and chronic complications which include CAD, CVA, CKD, retinopathy, and neuropathy. These are all discussed in detail with the patient.  - I have counseled her on diet management and weight loss, by adopting a carbohydrate restricted/protein rich diet.  - she  admits there is a room for improvement in her diet and drink choices. -  Suggestion is made for her to avoid simple carbohydrates  from her diet including Cakes, Sweet Desserts / Pastries, Ice Cream, Soda (diet and regular), Sweet Tea, Candies, Chips, Cookies, Sweet Pastries,  Store Bought Juices, Alcohol in Excess of  1-2 drinks a day, Artificial Sweeteners, Coffee Creamer, and "Sugar-free" Products. This will help patient to have stable blood glucose profile and potentially  avoid unintended weight gain.   - I encouraged her to switch to  unprocessed or minimally processed complex starch and increased protein intake (animal or plant source), fruits, and vegetables.  - she is advised to stick to a routine mealtimes to eat 3 meals  a day and avoid unnecessary snacks ( to snack only to correct hypoglycemia).    - I have approached her with the following individualized plan to manage diabetes and patient agrees:   -Based on her presentation with controlled glycemic profile, she would not need insulin treatment for now.   -She will be taken off of glipizide to avoid hypoglycemia.    -She is advised to continue Trulicity 1.5 mg subcu nightly weekly.   -Patient seems to have significant cognitive deficit, maximizing non-insulin therapeutic options would be entertained before considering insulin treatment.  -She will be reconsidered for low-dose glipizide if she loses control. -If her renal function continues to improve, she will be considered for low-dose metformin on subsequent visits. -Patient is encouraged to call clinic for blood glucose levels less than 70 or above 200 mg /dl.  - Patient specific target  A1c;  LDL, HDL, Triglycerides  were discussed in detail.  2) BP/HTN:  -Her blood pressure is controlled to target.  She is not on any antihypertensive medications.    3) Lipids/HPL:   Her recent lipid panel showed hypertriglyceridemia and controlled LDL at 27.  She is advised to continue atorvastatin 20 mg p.o. nightly.     4)  Weight/Diet: Her BMI 36-she is a candidate for modest weight loss.  CDE Consult has been  initiated , exercise, and detailed carbohydrates information provided.  5) hypothyroidism: -Diagnosed at approximate age of 8 years. -Her previsit labs are consistent with appropriate replacement.  She is advised to continue levothyroxine 75 g p.o. daily before breakfast.    - We discussed about the correct intake of her thyroid hormone, on  empty stomach at fasting, with water, separated by at least 30 minutes from breakfast and other  medications,  and separated by more than 4 hours from calcium, iron, multivitamins, acid reflux medications (PPIs). -Patient is made aware of the fact that thyroid hormone replacement is needed for life, dose to be adjusted by periodic monitoring of thyroid function tests.  6) Chronic Care/Health Maintenance:  -she  is on  Statin medications and  is encouraged to continue to follow up with Ophthalmology, Dentist,  Podiatrist at least yearly or according to recommendations, and advised to away from smoking.    I have recommended yearly flu vaccine and pneumonia vaccination at least every 5 years; moderate intensity exercise for up to 150 minutes weekly; and  sleep for at least 7 hours a day.   - I advised patient to maintain close follow up with Dettinger, Fransisca Kaufmann, MD for primary care needs.  - Time spent on this patient care encounter:  35 min, of which > 50% was spent in  counseling and the rest reviewing her blood glucose logs , discussing her hypoglycemia and hyperglycemia episodes, reviewing her current and  previous labs / studies  ( including abstraction from other facilities) and medications  doses and developing a  long term treatment plan and documenting her care.   Please refer to Patient Instructions for Blood Glucose Monitoring and Insulin/Medications Dosing Guide"  in media tab for additional information. Please  also refer to " Patient Self Inventory" in the Media  tab for reviewed elements of pertinent patient history.  Diane Morgan participated in the discussions, expressed understanding, and voiced agreement with the above plans.  All questions were answered to her satisfaction. she is encouraged to contact clinic should she have any questions or concerns prior to her return visit.   Follow up plan: - Return in about 6 months (around 03/03/2020) for F/U with Pre-visit Labs, NV  Office Urine MA, NV A1c in Office.  Glade Lloyd, MD [facility] [address] [place], [place] [address] Phone: [phone]  Fax: [phone]    09/02/2019, 12:36 PM  This note was partially dictated with voice recognition software. Similar sounding words can be transcribed inadequately or may not  be corrected upon review.

## 2019-09-02 NOTE — Patient Instructions (Signed)

## 2019-09-03 ENCOUNTER — Other Ambulatory Visit: Payer: Self-pay

## 2019-09-03 MED ORDER — LEVOTHYROXINE SODIUM 75 MCG PO TABS: 75.0000 ug | ORAL_TABLET | Freq: Every day | ORAL | 0 refills | Status: DC

## 2019-09-09 ENCOUNTER — Other Ambulatory Visit: Payer: Self-pay | Admitting: "Endocrinology

## 2019-09-16 ENCOUNTER — Ambulatory Visit: Payer: Medicaid Other | Admitting: Nutrition

## 2019-09-16 ENCOUNTER — Other Ambulatory Visit: Payer: Self-pay | Admitting: "Endocrinology

## 2019-10-23 ENCOUNTER — Other Ambulatory Visit: Payer: Self-pay | Admitting: Family Medicine

## 2019-10-24 DIAGNOSIS — H25812 Combined forms of age-related cataract, left eye: Secondary | ICD-10-CM | POA: Diagnosis not present

## 2019-10-24 DIAGNOSIS — H2512 Age-related nuclear cataract, left eye: Secondary | ICD-10-CM | POA: Diagnosis not present

## 2019-12-01 DIAGNOSIS — H5213 Myopia, bilateral: Secondary | ICD-10-CM | POA: Diagnosis not present

## 2019-12-04 ENCOUNTER — Other Ambulatory Visit: Payer: Self-pay | Admitting: "Endocrinology

## 2019-12-05 ENCOUNTER — Encounter: Payer: Self-pay | Admitting: *Deleted

## 2019-12-20 HISTORY — PX: EYE SURGERY: SHX253

## 2020-01-06 ENCOUNTER — Other Ambulatory Visit: Payer: Self-pay | Admitting: "Endocrinology

## 2020-01-16 ENCOUNTER — Other Ambulatory Visit: Payer: Self-pay | Admitting: Family Medicine

## 2020-01-16 DIAGNOSIS — E785 Hyperlipidemia, unspecified: Secondary | ICD-10-CM

## 2020-01-16 DIAGNOSIS — I10 Essential (primary) hypertension: Secondary | ICD-10-CM

## 2020-01-16 DIAGNOSIS — E1142 Type 2 diabetes mellitus with diabetic polyneuropathy: Secondary | ICD-10-CM

## 2020-01-23 ENCOUNTER — Other Ambulatory Visit: Payer: Self-pay

## 2020-01-23 ENCOUNTER — Encounter: Payer: Self-pay | Admitting: Family Medicine

## 2020-01-23 ENCOUNTER — Ambulatory Visit: Payer: Medicaid Other | Admitting: Family Medicine

## 2020-01-23 VITALS — BP 123/81 | HR 72 | Temp 97.6°F | Ht 61.0 in | Wt 189.4 lb

## 2020-01-23 DIAGNOSIS — E781 Pure hyperglyceridemia: Secondary | ICD-10-CM

## 2020-01-23 DIAGNOSIS — Z23 Encounter for immunization: Secondary | ICD-10-CM

## 2020-01-23 DIAGNOSIS — E1165 Type 2 diabetes mellitus with hyperglycemia: Secondary | ICD-10-CM

## 2020-01-23 DIAGNOSIS — I10 Essential (primary) hypertension: Secondary | ICD-10-CM

## 2020-01-23 DIAGNOSIS — E559 Vitamin D deficiency, unspecified: Secondary | ICD-10-CM | POA: Diagnosis not present

## 2020-01-23 DIAGNOSIS — E1142 Type 2 diabetes mellitus with diabetic polyneuropathy: Secondary | ICD-10-CM

## 2020-01-23 DIAGNOSIS — E785 Hyperlipidemia, unspecified: Secondary | ICD-10-CM

## 2020-01-23 DIAGNOSIS — E039 Hypothyroidism, unspecified: Secondary | ICD-10-CM | POA: Diagnosis not present

## 2020-01-23 DIAGNOSIS — E1122 Type 2 diabetes mellitus with diabetic chronic kidney disease: Secondary | ICD-10-CM | POA: Diagnosis not present

## 2020-01-23 DIAGNOSIS — N1832 Chronic kidney disease, stage 3b: Secondary | ICD-10-CM | POA: Diagnosis not present

## 2020-01-23 DIAGNOSIS — E782 Mixed hyperlipidemia: Secondary | ICD-10-CM

## 2020-01-23 LAB — BAYER DCA HB A1C WAIVED: HB A1C (BAYER DCA - WAIVED): 5.9 % (ref <7.0)

## 2020-01-23 MED ORDER — ATORVASTATIN CALCIUM 40 MG PO TABS: 40.0000 mg | ORAL_TABLET | Freq: Every day | ORAL | 3 refills | Status: DC

## 2020-01-23 MED ORDER — FENOFIBRATE 145 MG PO TABS: 145.0000 mg | ORAL_TABLET | Freq: Every day | ORAL | 3 refills | Status: DC

## 2020-01-23 MED ORDER — GABAPENTIN 300 MG PO CAPS
300.0000 mg | ORAL_CAPSULE | Freq: Every day | ORAL | 3 refills | Status: DC
Start: 2020-01-23 — End: 2021-01-21

## 2020-01-23 NOTE — Progress Notes (Signed)
BP 123/81   Pulse 72   Temp 97.6 F (36.4 C) (Temporal)   Ht 5' 1"  (1.549 m)   Wt 189 lb 6.4 oz (85.9 kg)   BMI 35.79 kg/m    Subjective:   Patient ID: Diane Morgan, female    DOB: 02/25/1971, 49 y.o.   MRN: [id number]  HPI: Diane Morgan is a 49 y.o. female presenting on 01/23/2020 for Diabetes (6 mos ckup), Hyperlipidemia, and Hypothyroidism   HPI  Type 2 diabetes mellitus Patient comes in today for recheck of his diabetes. Patient has been currently taking Trulicity and Y8E is 5.9 today, also sees endocrinology.. Patient is not currently on an ACE inhibitor/ARB. Patient has seen an ophthalmologist this year. Patient denies any new issues with their feet. The symptom started onset as an adult hypothyroidism and hyperlipidemia and hypertriglyceridemia and hypertension ARE RELATED TO DM   Hypothyroidism recheck Patient is coming in for thyroid recheck today as well. They deny any issues with hair changes or heat or cold problems or diarrhea or constipation. They deny any chest pain or palpitations. They are currently on levothyroxine 75 micrograms   Hyperlipidemia hypertriglyceridemia Patient is coming in for recheck of his hyperlipidemia. The patient is currently taking atorvastatin and fenofibrate. They deny any issues with myalgias or history of liver damage from it. They deny any focal numbness or weakness or chest pain.   Hypertension Patient is currently on no medication currently, and their blood pressure today is 123/81. Patient denies any lightheadedness or dizziness. Patient denies headaches, blurred vision, chest pains, shortness of breath, or weakness. Denies any side effects from medication and is content with current medication.   Relevant past medical, surgical, family and social history reviewed and updated as indicated. Interim medical history since our last visit reviewed. Allergies and medications reviewed and updated.  Review of Systems  Constitutional:  Negative for chills and fever.  Eyes: Negative for visual disturbance.  Respiratory: Negative for chest tightness and shortness of breath.   Cardiovascular: Negative for chest pain and leg swelling.  Genitourinary: Negative for difficulty urinating and dysuria.  Musculoskeletal: Negative for back pain and gait problem.  Skin: Negative for rash.  Neurological: Negative for light-headedness and headaches.  Psychiatric/Behavioral: Negative for agitation and behavioral problems.  All other systems reviewed and are negative.   Per HPI unless specifically indicated above   Allergies as of 01/23/2020      Reactions   Penicillins Anaphylaxis, Hives, Other (See Comments)   Childhood Reaction. Has patient had a PCN reaction causing immediate rash, facial/tongue/throat swelling, SOB or lightheadedness with hypotension: Yes Has patient had a PCN reaction causing severe rash involving mucus membranes or skin necrosis: No Has patient had a PCN reaction that required hospitalization: No Has patient had a PCN reaction occurring within the last 10 years: No If all of the above answers are "NO", then may proceed with Cephalosporin use.   Niaspan [niacin] Other (See Comments)   Patient passes out.      Medication List       Accurate as of January 23, 2020 11:54 AM. If you have any questions, ask your nurse or doctor.        Accu-Chek Aviva Plus w/Device Kit USE AS DIRECTED   Accu-Chek Guide test strip Generic drug: glucose blood Use as directed to check blood glucose three times daily   aspirin 81 MG chewable tablet Chew 81 mg by mouth daily.   atorvastatin 40 MG tablet Commonly known  as: LIPITOR Take 1 tablet (40 mg total) by mouth daily.   cetirizine 10 MG tablet Commonly known as: ZYRTEC Take 10 mg by mouth at bedtime.   cycloSPORINE 0.05 % ophthalmic emulsion Commonly known as: RESTASIS 1 drop 2 (two) times daily.   fenofibrate 145 MG tablet Commonly known as: TRICOR Take 1  tablet (145 mg total) by mouth daily.   gabapentin 300 MG capsule Commonly known as: NEURONTIN Take 1 capsule (300 mg total) by mouth at bedtime. What changed: See the new instructions. Changed by: Fransisca Kaufmann Alexa Golebiewski, MD   ibuprofen 200 MG tablet Commonly known as: ADVIL Take 400 mg by mouth every 6 (six) hours as needed for headache or moderate pain.   levothyroxine 75 MCG tablet Commonly known as: SYNTHROID Take 1 tablet by mouth once daily   Trulicity 1.5 HX/5.0VW Sopn Generic drug: Dulaglutide INJECT 1.5 MG SUBCUTANEOUSLY ONCE A WEEK   Vitamin D (Ergocalciferol) 1.25 MG (50000 UNIT) Caps capsule Commonly known as: DRISDOL Take 1 capsule (50,000 Units total) by mouth every 7 (seven) days.        Objective:   BP 123/81   Pulse 72   Temp 97.6 F (36.4 C) (Temporal)   Ht 5' 1"  (1.549 m)   Wt 189 lb 6.4 oz (85.9 kg)   BMI 35.79 kg/m   Wt Readings from Last 3 Encounters:  01/23/20 189 lb 6.4 oz (85.9 kg)  09/02/19 191 lb 3.2 oz (86.7 kg)  07/26/19 188 lb 4 oz (85.4 kg)    Physical Exam Vitals and nursing note reviewed.  Constitutional:      General: She is not in acute distress.    Appearance: She is well-developed. She is not diaphoretic.  Eyes:     Conjunctiva/sclera: Conjunctivae normal.  Cardiovascular:     Rate and Rhythm: Normal rate and regular rhythm.     Heart sounds: Normal heart sounds. No murmur heard.   Pulmonary:     Effort: Pulmonary effort is normal. No respiratory distress.     Breath sounds: Normal breath sounds. No wheezing.  Musculoskeletal:        General: No tenderness. Normal range of motion.  Skin:    General: Skin is warm and dry.     Findings: No rash.  Neurological:     Mental Status: She is alert and oriented to person, place, and time.     Coordination: Coordination normal.  Psychiatric:        Behavior: Behavior normal.       Assessment & Plan:   Problem List Items Addressed This Visit      Cardiovascular and  Mediastinum   Hypertension   Relevant Medications   fenofibrate (TRICOR) 145 MG tablet   atorvastatin (LIPITOR) 40 MG tablet     Endocrine   Type 2 diabetes mellitus with stage 3 chronic kidney disease, without long-term current use of insulin (HCC)   Relevant Medications   fenofibrate (TRICOR) 145 MG tablet   atorvastatin (LIPITOR) 40 MG tablet   Hypothyroidism   Type 2 diabetes mellitus with diabetic polyneuropathy (HCC) - Primary   Relevant Medications   gabapentin (NEURONTIN) 300 MG capsule   fenofibrate (TRICOR) 145 MG tablet   atorvastatin (LIPITOR) 40 MG tablet   Other Relevant Orders   Bayer DCA Hb A1c Waived   CBC with Differential/Platelet   CMP14+EGFR   Uncontrolled type 2 diabetes mellitus with hyperglycemia (HCC)   Relevant Medications   atorvastatin (LIPITOR) 40 MG tablet  Other   Hyperlipidemia   Relevant Medications   fenofibrate (TRICOR) 145 MG tablet   atorvastatin (LIPITOR) 40 MG tablet   Hypertriglyceridemia   Relevant Medications   fenofibrate (TRICOR) 145 MG tablet   atorvastatin (LIPITOR) 40 MG tablet   Other Relevant Orders   Lipid panel    Other Visit Diagnoses    Dyslipidemia       Relevant Medications   fenofibrate (TRICOR) 145 MG tablet   atorvastatin (LIPITOR) 40 MG tablet   Essential hypertension       Relevant Medications   fenofibrate (TRICOR) 145 MG tablet   atorvastatin (LIPITOR) 40 MG tablet   Vitamin D deficiency       Relevant Orders   VITAMIN D 25 Hydroxy (Vit-D Deficiency, Fractures)      No change in medication, continue to follow with Dr. Danelle Earthly as well. Follow up plan: Return in about 6 months (around 07/22/2020), or if symptoms worsen or fail to improve, for Hypertension and hypertriglyceridemia and cholesterol..  Counseling provided for all of the vaccine components Orders Placed This Encounter  Procedures  . Lipid panel  . Bayer DCA Hb A1c Waived  . CBC with Differential/Platelet  . CMP14+EGFR  . VITAMIN D 25  Hydroxy (Vit-D Deficiency, Fractures)    Caryl Pina, MD [facility] 01/23/2020, 11:54 AM

## 2020-01-24 ENCOUNTER — Ambulatory Visit: Payer: Medicaid Other | Admitting: Family Medicine

## 2020-01-24 LAB — LIPID PANEL
Chol/HDL Ratio: 3.9 ratio (ref 0.0–4.4)
Cholesterol, Total: 113 mg/dL (ref 100–199)
HDL: 29 mg/dL — ABNORMAL LOW (ref >39)
LDL Chol Calc (NIH): 53 mg/dL (ref 0–99)
Triglycerides: 188 mg/dL — ABNORMAL HIGH (ref 0–149)
VLDL Cholesterol Cal: 31 mg/dL (ref 5–40)

## 2020-01-24 LAB — CBC WITH DIFFERENTIAL/PLATELET
Basophils Absolute: 0.1 10*3/uL (ref 0.0–0.2)
Basos: 1 %
EOS (ABSOLUTE): 0.4 10*3/uL (ref 0.0–0.4)
Eos: 4 %
Hematocrit: 46.3 % (ref 34.0–46.6)
Hemoglobin: 15.5 g/dL (ref 11.1–15.9)
Immature Grans (Abs): 0 10*3/uL (ref 0.0–0.1)
Immature Granulocytes: 0 %
Lymphocytes Absolute: 2.6 10*3/uL (ref 0.7–3.1)
Lymphs: 30 %
MCH: 29.5 pg (ref 26.6–33.0)
MCHC: 33.5 g/dL (ref 31.5–35.7)
MCV: 88 fL (ref 79–97)
Monocytes Absolute: 0.6 10*3/uL (ref 0.1–0.9)
Monocytes: 7 %
Neutrophils Absolute: 5.2 10*3/uL (ref 1.4–7.0)
Neutrophils: 58 %
Platelets: 252 10*3/uL (ref 150–450)
RBC: 5.25 x10E6/uL (ref 3.77–5.28)
RDW: 13.7 % (ref 11.7–15.4)
WBC: 8.9 10*3/uL (ref 3.4–10.8)

## 2020-01-24 LAB — CMP14+EGFR
ALT: 15 IU/L (ref 0–32)
AST: 20 IU/L (ref 0–40)
Albumin/Globulin Ratio: 1.9 (ref 1.2–2.2)
Albumin: 3.9 g/dL (ref 3.8–4.8)
Alkaline Phosphatase: 67 IU/L (ref 44–121)
BUN/Creatinine Ratio: 8 — ABNORMAL LOW (ref 9–23)
BUN: 10 mg/dL (ref 6–24)
Bilirubin Total: 0.5 mg/dL (ref 0.0–1.2)
CO2: 22 mmol/L (ref 20–29)
Calcium: 9.1 mg/dL (ref 8.7–10.2)
Chloride: 104 mmol/L (ref 96–106)
Creatinine, Ser: 1.27 mg/dL — ABNORMAL HIGH (ref 0.57–1.00)
GFR calc Af Amer: 58 mL/min/{1.73_m2} — ABNORMAL LOW (ref >59)
GFR calc non Af Amer: 50 mL/min/{1.73_m2} — ABNORMAL LOW (ref >59)
Globulin, Total: 2.1 g/dL (ref 1.5–4.5)
Glucose: 111 mg/dL — ABNORMAL HIGH (ref 65–99)
Potassium: 4.5 mmol/L (ref 3.5–5.2)
Sodium: 138 mmol/L (ref 134–144)
Total Protein: 6 g/dL (ref 6.0–8.5)

## 2020-01-24 LAB — COMPREHENSIVE METABOLIC PANEL
ALT: 14 IU/L (ref 0–32)
AST: 18 IU/L (ref 0–40)
Albumin/Globulin Ratio: 1.5 (ref 1.2–2.2)
Albumin: 3.7 g/dL — ABNORMAL LOW (ref 3.8–4.8)
Alkaline Phosphatase: 69 IU/L (ref 44–121)
BUN/Creatinine Ratio: 7 — ABNORMAL LOW (ref 9–23)
BUN: 10 mg/dL (ref 6–24)
Bilirubin Total: 0.5 mg/dL (ref 0.0–1.2)
CO2: 22 mmol/L (ref 20–29)
Calcium: 9 mg/dL (ref 8.7–10.2)
Chloride: 104 mmol/L (ref 96–106)
Creatinine, Ser: 1.43 mg/dL — ABNORMAL HIGH (ref 0.57–1.00)
GFR calc Af Amer: 50 mL/min/{1.73_m2} — ABNORMAL LOW (ref >59)
GFR calc non Af Amer: 44 mL/min/{1.73_m2} — ABNORMAL LOW (ref >59)
Globulin, Total: 2.5 g/dL (ref 1.5–4.5)
Glucose: 116 mg/dL — ABNORMAL HIGH (ref 65–99)
Potassium: 4.7 mmol/L (ref 3.5–5.2)
Sodium: 140 mmol/L (ref 134–144)
Total Protein: 6.2 g/dL (ref 6.0–8.5)

## 2020-01-24 LAB — VITAMIN D 25 HYDROXY (VIT D DEFICIENCY, FRACTURES): Vit D, 25-Hydroxy: 18.7 ng/mL — ABNORMAL LOW (ref 30.0–100.0)

## 2020-01-24 LAB — TSH: TSH: 1.08 u[IU]/mL (ref 0.450–4.500)

## 2020-01-24 LAB — T4, FREE: Free T4: 1.38 ng/dL (ref 0.82–1.77)

## 2020-02-03 ENCOUNTER — Other Ambulatory Visit: Payer: Self-pay | Admitting: "Endocrinology

## 2020-03-02 ENCOUNTER — Other Ambulatory Visit: Payer: Self-pay | Admitting: "Endocrinology

## 2020-03-03 ENCOUNTER — Other Ambulatory Visit: Payer: Self-pay

## 2020-03-03 ENCOUNTER — Other Ambulatory Visit: Payer: Medicaid Other

## 2020-03-06 ENCOUNTER — Other Ambulatory Visit: Payer: Self-pay | Admitting: "Endocrinology

## 2020-03-09 ENCOUNTER — Other Ambulatory Visit: Payer: Self-pay

## 2020-03-09 ENCOUNTER — Ambulatory Visit (INDEPENDENT_AMBULATORY_CARE_PROVIDER_SITE_OTHER): Payer: Medicaid Other | Admitting: Nurse Practitioner

## 2020-03-09 ENCOUNTER — Encounter: Payer: Self-pay | Admitting: Nurse Practitioner

## 2020-03-09 VITALS — BP 126/75 | HR 71 | Ht 61.0 in | Wt 191.0 lb

## 2020-03-09 DIAGNOSIS — I10 Essential (primary) hypertension: Secondary | ICD-10-CM | POA: Diagnosis not present

## 2020-03-09 DIAGNOSIS — E039 Hypothyroidism, unspecified: Secondary | ICD-10-CM

## 2020-03-09 DIAGNOSIS — E785 Hyperlipidemia, unspecified: Secondary | ICD-10-CM | POA: Diagnosis not present

## 2020-03-09 DIAGNOSIS — E1165 Type 2 diabetes mellitus with hyperglycemia: Secondary | ICD-10-CM | POA: Diagnosis not present

## 2020-03-09 MED ORDER — TRULICITY 1.5 MG/0.5ML ~~LOC~~ SOAJ: SUBCUTANEOUS | 3 refills | Status: DC

## 2020-03-09 NOTE — Patient Instructions (Signed)

## 2020-03-09 NOTE — Progress Notes (Signed)
03/09/2020, 8:53 AM        Endocrinology follow-up note    Subjective:    Patient ID: Diane Morgan, female    DOB: [birth year].  Diane Morgan is being iseen in follow-up for management of currently controlled symptomatic type 2 diabetes, hypothyroidism, hyperlipidemia, hypertension. PMD:  Dettinger, Fransisca Kaufmann, MD.    Past Medical History:  Diagnosis Date   Anemia    Arthritis    Cataract    right   Cellulitis and abscess of trunk 11/28/2012   Diabetic peripheral neuropathy (HCC)    DM (diabetes mellitus) ([place])    Eczema    GERD (gastroesophageal reflux disease)    Hyperlipidemia    Hypertension    Hypothyroidism    Leg pain, bilateral 10/08/09   NIDDM (non-insulin dependent diabetes mellitus)    URI (upper respiratory infection) 07/01/10   Past Surgical History:  Procedure Laterality Date   CATARACT EXTRACTION W/PHACO Right 06/12/2017   Procedure: CATARACT EXTRACTION WITH PHACOEMULSIFICATION  AND INTRAOCULAR LENS PLACEMENT RIGHT EYE;  Surgeon: Tonny Branch, MD;  Location: [facility];  Service: Ophthalmology;  Laterality: Right;  CDE: 1.16   CHOLECYSTECTOMY     EYE SURGERY     right catracts   EYE SURGERY  12/2019   left cataract   LASER ABLATION CONDOLAMATA N/A 03/08/2017   Procedure: LASER ABLATION OF THE CERVIX;  Surgeon: Florian Buff, MD;  Location: [facility];  Service: Gynecology;  Laterality: N/A;   WISDOM TOOTH EXTRACTION     Social History   Socioeconomic History   Marital status: Single    Spouse name: Not on file   Number of children: Not on file   Years of education: Not on file   Highest education level: Not on file  Occupational History   Not on file  Tobacco Use   Smoking status: Current Every Day Smoker    Years: 1.00    Types: Cigarettes   Smokeless tobacco: Never Used   Tobacco comment: smokes 4 cig daily  Vaping Use   Vaping Use: Never used  Substance and Sexual Activity   Alcohol  use: No   Drug use: No   Sexual activity: Not Currently    Birth control/protection: None  Other Topics Concern   Not on file  Social History Narrative   Not on file   Social Determinants of Health   Financial Resource Strain: Not on file  Food Insecurity: Not on file  Transportation Needs: Not on file  Physical Activity: Not on file  Stress: Not on file  Social Connections: Not on file   Outpatient Encounter Medications as of 03/09/2020  Medication Sig   aspirin 81 MG chewable tablet Chew 81 mg by mouth daily.   atorvastatin (LIPITOR) 40 MG tablet Take 1 tablet (40 mg total) by mouth daily.   Blood Glucose Monitoring Suppl (ACCU-CHEK AVIVA PLUS) w/Device KIT USE AS DIRECTED   cetirizine (ZYRTEC) 10 MG tablet Take 10 mg by mouth at bedtime.    cycloSPORINE (RESTASIS) 0.05 % ophthalmic emulsion 1 drop 2 (two) times daily.   EUTHYROX 75 MCG tablet Take 1 tablet by mouth once daily   fenofibrate (TRICOR) 145 MG tablet Take 1 tablet (145 mg total) by mouth daily.   gabapentin (NEURONTIN) 300 MG capsule Take 1 capsule (300 mg total) by mouth at bedtime.   glucose blood (ACCU-CHEK GUIDE) test strip Use as directed to check blood glucose three times daily  ibuprofen (ADVIL,MOTRIN) 200 MG tablet Take 400 mg by mouth every 6 (six) hours as needed for headache or moderate pain.   TRULICITY 1.5 KX/3.8HW SOPN INJECT 1.$RemoveBefor'5MG'[id number]$  SUBCUTANEOUSLY ONCE A WEEK   Vitamin D, Ergocalciferol, (DRISDOL) 1.25 MG (50000 UNIT) CAPS capsule Take 1 capsule (50,000 Units total) by mouth every 7 (seven) days.   No facility-administered encounter medications on file as of 03/09/2020.    ALLERGIES: Allergies  Allergen Reactions   Penicillins Anaphylaxis, Hives and Other (See Comments)    Childhood Reaction. Has patient had a PCN reaction causing immediate rash, facial/tongue/throat swelling, SOB or lightheadedness with hypotension: Yes Has patient had a PCN reaction causing severe rash  involving mucus membranes or skin necrosis: No Has patient had a PCN reaction that required hospitalization: No Has patient had a PCN reaction occurring within the last 10 years: No If all of the above answers are "NO", then may proceed with Cephalosporin use.    Niaspan [Niacin] Other (See Comments)    Patient passes out.    VACCINATION STATUS: Immunization History  Administered Date(s) Administered   Influenza,inj,Quad PF,6+ Mos 11/28/2012, 01/22/2014, 12/26/2014, 12/07/2015, 12/15/2016, 01/02/2018, 01/07/2019, 01/23/2020   PFIZER SARS-COV-2 Vaccination 06/29/2019, 07/20/2019, 01/23/2020   Pneumococcal Polysaccharide-23 01/07/2019   Tdap 03/19/2013   Diabetes She presents for her follow-up diabetic visit. She has type 2 diabetes mellitus. Onset time: Diagnosed at approx age of 35. Her disease course has been stable. There are no hypoglycemic associated symptoms. Pertinent negatives for hypoglycemia include no nervousness/anxiousness or tremors. There are no diabetic associated symptoms. Pertinent negatives for diabetes include no fatigue and no weight loss. There are no hypoglycemic complications. Symptoms are stable. Diabetic complications include nephropathy. Risk factors for coronary artery disease include diabetes mellitus, dyslipidemia, hypertension, obesity, sedentary lifestyle and tobacco exposure. Current diabetic treatments: Trulicity 1.5 SQ weekly. She is compliant with treatment all of the time. Her weight is fluctuating minimally. She is following a generally unhealthy diet. When asked about meal planning, she reported none. She has not had a previous visit with a dietitian. Her overall blood glucose range is 110-130 mg/dl. (She presents today, accompanied by her mother, with her meter, no logs showing average glucose between 110-130.  Her last A1c was 5.9%, increasing slightly from previous visit of 5.4%.  She does not monitor her glucose routinely due to her only agent being  Trulicity 1.5 mg SQ once weekly.  She denies any s/s of hypoglycemia.) An ACE inhibitor/angiotensin II receptor blocker is not being taken. She does not see a podiatrist.Eye exam is current.  Thyroid Problem Presents for follow-up visit. Patient reports no anxiety, cold intolerance, constipation, depressed mood, diarrhea, fatigue, heat intolerance, tremors, weight gain or weight loss. The symptoms have been stable. Her past medical history is significant for diabetes and hyperlipidemia.  Hyperlipidemia This is a chronic problem. The current episode started more than 1 year ago. The problem is controlled. Recent lipid tests were reviewed and are variable. Exacerbating diseases include diabetes, hypothyroidism and obesity. Factors aggravating her hyperlipidemia include fatty foods. Current antihyperlipidemic treatment includes statins. The current treatment provides moderate improvement of lipids. There are no compliance problems.  Risk factors for coronary artery disease include diabetes mellitus, dyslipidemia, hypertension, obesity and a sedentary lifestyle.    Review of systems  Constitutional: + Minimally fluctuating body weight,  current Body mass index is 36.09 kg/m. , no fatigue, no subjective hyperthermia, no subjective hypothermia Eyes: no blurry vision, no xerophthalmia ENT: no sore throat, no nodules palpated in  throat, no dysphagia/odynophagia, no hoarseness Cardiovascular: no chest pain, no shortness of breath, no palpitations, no leg swelling Respiratory: no cough, no shortness of breath Gastrointestinal: no nausea/vomiting/diarrhea Musculoskeletal: no muscle/joint aches Skin: no rashes, no hyperemia Neurological: no tremors, no numbness, no tingling, no dizziness Psychiatric: no depression, no anxiety   Objective:    BP 126/75 (BP Location: Left Arm)    Pulse 71    Ht 5\' 1"  (1.549 m)    Wt 191 lb (86.6 kg)    BMI 36.09 kg/m   Wt Readings from Last 3 Encounters:  03/09/20 191  lb (86.6 kg)  01/23/20 189 lb 6.4 oz (85.9 kg)  09/02/19 191 lb 3.2 oz (86.7 kg)    BP Readings from Last 3 Encounters:  03/09/20 126/75  01/23/20 123/81  09/02/19 109/74    Physical Exam- Limited  Constitutional:  Body mass index is 36.09 kg/m. , not in acute distress, normal state of mind Eyes:  EOMI, no exophthalmos Neck: Supple Thyroid: No gross goiter Cardiovascular: RRR, no murmers, rubs, or gallops, no edema Respiratory: Adequate breathing efforts, no crackles, rales, rhonchi, or wheezing Musculoskeletal: no gross deformities, strength intact in all four extremities, no gross restriction of joint movements Skin:  no rashes, no hyperemia Neurological: no tremor with outstretched hands   POCT ABI Results 03/09/20   Right ABI:  1.29      Left ABI:  1.23  Right leg systolic / diastolic: 162/82 mmHg Left leg systolic / diastolic: 155/82 mmHg  Arm systolic / diastolic: 126/75 mmHG  Detailed report will be scanned into patient chart.   CMP     Component Value Date/Time   NA 138 01/23/2020 1126   K 4.5 01/23/2020 1126   CL 104 01/23/2020 1126   CO2 22 01/23/2020 1126   GLUCOSE 111 (H) 01/23/2020 1126   GLUCOSE 438 (H) 06/06/2017 0811   BUN 10 01/23/2020 1126   CREATININE 1.27 (H) 01/23/2020 1126   CREATININE 1.13 (H) 08/17/2012 1031   CALCIUM 9.1 01/23/2020 1126   PROT 6.0 01/23/2020 1126   ALBUMIN 3.9 01/23/2020 1126   AST 20 01/23/2020 1126   ALT 15 01/23/2020 1126   ALKPHOS 67 01/23/2020 1126   BILITOT 0.5 01/23/2020 1126   GFRNONAA 50 (L) 01/23/2020 1126   GFRNONAA 61 08/17/2012 1031   GFRAA 58 (L) 01/23/2020 1126   GFRAA 70 08/17/2012 1031     Diabetic Labs (most recent): Lab Results  Component Value Date   HGBA1C 5.9 01/23/2020   HGBA1C 5.4 07/26/2019   HGBA1C 7.1 (H) 04/22/2019     Lipid Panel ( most recent) Lipid Panel     Component Value Date/Time   CHOL 113 01/23/2020 1123   CHOL 85 08/17/2012 1031   TRIG 188 (H) 01/23/2020 1123    TRIG 161 (H) 08/17/2012 1031   HDL 29 (L) 01/23/2020 1123   HDL 26 (L) 08/17/2012 1031   CHOLHDL 3.9 01/23/2020 1123   CHOLHDL 10.3 11/29/2012 1030   VLDL 39 11/29/2012 1030   LDLCALC 53 01/23/2020 1123   LDLCALC 27 08/17/2012 1031     Lab Results  Component Value Date   TSH 1.080 01/23/2020   TSH 1.320 07/26/2019   TSH 0.619 04/22/2019   TSH 0.477 01/25/2019   TSH 0.854 10/15/2018   TSH 1.130 04/16/2018   TSH 0.346 (L) 01/02/2018   TSH 0.637 09/04/2017   TSH 1.820 12/15/2016   TSH 1.760 12/07/2015   FREET4 1.38 01/23/2020   FREET4 1.91 (H) 04/22/2019  FREET4 1.69 10/15/2018   FREET4 1.43 04/16/2018   FREET4 1.68 01/02/2018   FREET4 1.52 09/04/2017   FREET4 1.19 11/14/2014      Assessment & Plan:   1) Type 2 diabetes mellitus with stage 3 chronic kidney disease, without long-term current use of insulin ([place])  - Diane Morgan has currently uncontrolled symptomatic type 2 DM since 49 years of age.  She presents today, accompanied by her mother, with her meter, no logs showing average glucose between 110-130.  Her last A1c was 5.9%, increasing slightly from previous visit of 5.4%.  She does not monitor her glucose routinely due to her only agent being Trulicity 1.5 mg SQ once weekly.  She denies any s/s of hypoglycemia.    -her diabetes is complicated by obesity/sedentary life, CKD, smoking, and Diane Morgan remains at a high risk for more acute and chronic complications which include CAD, CVA, CKD, retinopathy, and neuropathy. These are all discussed in detail with the patient.  - Nutritional counseling repeated at each appointment due to patients tendency to fall back in to old habits.  - The patient admits there is a room for improvement in their diet and drink choices. -  Suggestion is made for the patient to avoid simple carbohydrates from their diet including Cakes, Sweet Desserts / Pastries, Ice Cream, Soda (diet and regular), Sweet Tea, Candies, Chips, Cookies,  Sweet Pastries,  Store Bought Juices, Alcohol in Excess of  1-2 drinks a day, Artificial Sweeteners, Coffee Creamer, and "Sugar-free" Products. This will help patient to have stable blood glucose profile and potentially avoid unintended weight gain.   - I encouraged the patient to switch to  unprocessed or minimally processed complex starch and increased protein intake (animal or plant source), fruits, and vegetables.   - Patient is advised to stick to a routine mealtimes to eat 3 meals  a day and avoid unnecessary snacks ( to snack only to correct hypoglycemia).  - I have approached her with the following individualized plan to manage diabetes and patient agrees:   -Based on her presentation with controlled glycemic profile, she would not need insulin treatment for now.    -She is advised to continue Trulicity 1.5 mg SQ weekly.  Will consider adding Glipizide at next visit if she loses control.  -Patient seems to have significant cognitive deficit, maximizing non-insulin therapeutic options would be entertained before considering insulin treatment.   - Patient specific target  A1c;  LDL, HDL, Triglycerides  were discussed in detail.  2) BP/HTN:  -Her blood pressure is controlled to target without the use of antihypertensive medications.  3) Lipids/HPL:    Her most recent lipid panel from 01/23/20 shows controlled LDL of 31 and elevated triglycerides of 188.  She is advised to continue Atorvastatin 40 mg po daily at bedtime.  Side effects and precautions discussed with her.  She is advised to avoid fried foods and butter.  4)  Weight/Diet:  Her Body mass index is 36.09 kg/m.-she is a candidate for modest weight loss.  CDE Consult has been  initiated , exercise, and detailed carbohydrates information provided.  5) Hypothyroidism: -Diagnosed at approximate age of 36 years. -Her previsit thyroid function tests are consistent with appropriate hormone replacement.  She is advised to continue  Levothyroxine 75 mcg po daily before breakfast.   - We discussed about the correct intake of her thyroid hormone, on empty stomach at fasting, with water, separated by at least 30 minutes from breakfast and other  medications,  and separated by more than 4 hours from calcium, iron, multivitamins, acid reflux medications (PPIs). -Patient is made aware of the fact that thyroid hormone replacement is needed for life, dose to be adjusted by periodic monitoring of thyroid function tests.  6) Chronic Care/Health Maintenance: -she is on statin medications and  is encouraged to continue to follow up with Ophthalmology, Dentist,  Podiatrist at least yearly or according to recommendations, and advised to away from smoking.    I have recommended yearly flu vaccine and pneumonia vaccination at least every 5 years; moderate intensity exercise for up to 150 minutes weekly; and  sleep for at least 7 hours a day.   - I advised patient to maintain close follow up with Dettinger, Fransisca Kaufmann, MD for primary care needs.  - Time spent on this patient care encounter:  30 min, of which > 50% was spent in  counseling and the rest reviewing her blood glucose logs , discussing her hypoglycemia and hyperglycemia episodes, reviewing her current and  previous labs / studies  ( including abstraction from other facilities) and medications  doses and developing a  long term treatment plan and documenting her care.   Please refer to Patient Instructions for Blood Glucose Monitoring and Insulin/Medications Dosing Guide"  in media tab for additional information. Please  also refer to " Patient Self Inventory" in the Media  tab for reviewed elements of pertinent patient history.  Wendi Maya Sox participated in the discussions, expressed understanding, and voiced agreement with the above plans.  All questions were answered to her satisfaction. she is encouraged to contact clinic should she have any questions or concerns prior to her  return visit.   Follow up plan: - Return in about 4 months (around 07/08/2020) for Diabetes follow up with A1c in office, Thyroid follow up, Previsit labs.  Rayetta Pigg, [facility] [address] [place], [place] [address] Phone: [phone] Fax: [phone]  03/09/2020, 8:53 AM

## 2020-04-21 ENCOUNTER — Other Ambulatory Visit: Payer: Self-pay | Admitting: Family Medicine

## 2020-04-21 DIAGNOSIS — E1142 Type 2 diabetes mellitus with diabetic polyneuropathy: Secondary | ICD-10-CM

## 2020-04-21 DIAGNOSIS — E785 Hyperlipidemia, unspecified: Secondary | ICD-10-CM

## 2020-04-21 DIAGNOSIS — I10 Essential (primary) hypertension: Secondary | ICD-10-CM

## 2020-06-09 ENCOUNTER — Other Ambulatory Visit: Payer: Self-pay | Admitting: "Endocrinology

## 2020-06-29 ENCOUNTER — Other Ambulatory Visit: Payer: Self-pay

## 2020-06-29 ENCOUNTER — Other Ambulatory Visit: Payer: Medicaid Other

## 2020-06-29 DIAGNOSIS — E039 Hypothyroidism, unspecified: Secondary | ICD-10-CM | POA: Diagnosis not present

## 2020-06-29 MED ORDER — VITAMIN D (ERGOCALCIFEROL) 1.25 MG (50000 UNIT) PO CAPS: 50000.0000 [IU] | ORAL_CAPSULE | ORAL | 0 refills | Status: DC

## 2020-06-30 LAB — TSH: TSH: 0.671 u[IU]/mL (ref 0.450–4.500)

## 2020-06-30 LAB — T4, FREE: Free T4: 2.22 ng/dL — ABNORMAL HIGH (ref 0.82–1.77)

## 2020-07-09 NOTE — Patient Instructions (Signed)
Advice for Weight Management  -For most of us the best way to lose weight is by diet management. Generally speaking, diet management means consuming less calories intentionally which over time brings about progressive weight loss.  This can be achieved more effectively by restricting carbohydrate consumption to the minimum possible.  So, it is critically important to know your numbers: how much calorie you are consuming and how much calorie you need. More importantly, our carbohydrates sources should be unprocessed or minimally processed complex starch food items.   Sometimes, it is important to balance nutrition by increasing protein intake (animal or plant source), fruits, and vegetables.  -Sticking to a routine mealtime to eat 3 meals a day and avoiding unnecessary snacks is shown to have a big role in weight control. Under normal circumstances, the only time we lose real weight is when we are hungry, so allow hunger to take place- hunger means no food between meal times, only water.  It is not advisable to starve.   -It is better to avoid simple carbohydrates including: Cakes, Sweet Desserts, Ice Cream, Soda (diet and regular), Sweet Tea, Candies, Chips, Cookies, Store Bought Juices, Alcohol in Excess of  1-2 drinks a day, Artificial Sweeteners, Doughnuts, Coffee Creamers, "Sugar-free" Products, etc, etc.  This is not a complete list.....    -Consulting with certified diabetes educators is proven to provide you with the most accurate and current information on diet.  Also, you may be  interested in discussing diet options/exchanges , we can schedule a visit with Diane Morgan, RDN, CDE for individualized nutrition education.  -Exercise: If you are able: 30 -60 minutes a day ,4 days a week, or 150 minutes a week.  The longer the better.  Combine stretch, strength, and aerobic activities.  If you were told in the past that you have high risk for cardiovascular diseases, you may seek evaluation by  your heart doctor prior to initiating moderate to intense exercise programs.      - The correct intake of thyroid hormone (Levothyroxine, Synthroid), is on empty stomach first thing in the morning, with water, separated by at least 30 minutes from breakfast and other medications,  and separated by more than 4 hours from calcium, iron, multivitamins, acid reflux medications (PPIs).  - This medication is a life-long medication and will be needed to correct thyroid hormone imbalances for the rest of your life.  The dose may change from time to time, based on thyroid blood work.  - It is extremely important to be consistent taking this medication, near the same time each morning.  -AVOID TAKING PRODUCTS CONTAINING BIOTIN (commonly found in Hair, Skin, Nails vitamins) AS IT INTERFERES WITH THE VALIDITY OF THYROID FUNCTION BLOOD TESTS.  

## 2020-07-10 ENCOUNTER — Ambulatory Visit (INDEPENDENT_AMBULATORY_CARE_PROVIDER_SITE_OTHER): Payer: Medicaid Other | Admitting: Nurse Practitioner

## 2020-07-10 ENCOUNTER — Other Ambulatory Visit: Payer: Self-pay

## 2020-07-10 ENCOUNTER — Encounter: Payer: Self-pay | Admitting: Nurse Practitioner

## 2020-07-10 VITALS — BP 121/83 | HR 78 | Ht 61.0 in | Wt 198.0 lb

## 2020-07-10 DIAGNOSIS — E039 Hypothyroidism, unspecified: Secondary | ICD-10-CM | POA: Diagnosis not present

## 2020-07-10 DIAGNOSIS — I10 Essential (primary) hypertension: Secondary | ICD-10-CM

## 2020-07-10 DIAGNOSIS — E785 Hyperlipidemia, unspecified: Secondary | ICD-10-CM | POA: Diagnosis not present

## 2020-07-10 DIAGNOSIS — E1165 Type 2 diabetes mellitus with hyperglycemia: Secondary | ICD-10-CM

## 2020-07-10 LAB — POCT GLYCOSYLATED HEMOGLOBIN (HGB A1C): HbA1c, POC (controlled diabetic range): 7.6 % — AB (ref 0.0–7.0)

## 2020-07-10 MED ORDER — LEVOTHYROXINE SODIUM 50 MCG PO TABS: 75.0000 ug | ORAL_TABLET | Freq: Every day | ORAL | 3 refills | Status: DC

## 2020-07-10 NOTE — Progress Notes (Signed)
07/10/2020, 9:13 AM        Endocrinology follow-up note    Subjective:    Patient ID: Diane Morgan, female    DOB: December 21, 1970.  Diane Morgan is being iseen in follow-up for management of currently controlled symptomatic type 2 diabetes, hypothyroidism, hyperlipidemia, hypertension. PMD:  Dettinger, Fransisca Kaufmann, MD.    Past Medical History:  Diagnosis Date  . Anemia   . Arthritis   . Cataract    right  . Cellulitis and abscess of trunk 11/28/2012  . Diabetic peripheral neuropathy ([place])   . DM (diabetes mellitus) ([place])   . Eczema   . GERD (gastroesophageal reflux disease)   . Hyperlipidemia   . Hypertension   . Hypothyroidism   . Leg pain, bilateral 10/08/09  . NIDDM (non-insulin dependent diabetes mellitus)   . URI (upper respiratory infection) 07/01/10   Past Surgical History:  Procedure Laterality Date  . CATARACT EXTRACTION W/PHACO Right 06/12/2017   Procedure: CATARACT EXTRACTION WITH PHACOEMULSIFICATION  AND INTRAOCULAR LENS PLACEMENT RIGHT EYE;  Surgeon: Tonny Branch, MD;  Location: [facility];  Service: Ophthalmology;  Laterality: Right;  CDE: 1.16  . CHOLECYSTECTOMY    . EYE SURGERY     right catracts  . EYE SURGERY  12/2019   left cataract  . LASER ABLATION CONDOLAMATA N/A 03/08/2017   Procedure: LASER ABLATION OF THE CERVIX;  Surgeon: Florian Buff, MD;  Location: [facility];  Service: Gynecology;  Laterality: N/A;  . WISDOM TOOTH EXTRACTION     Social History   Socioeconomic History  . Marital status: Single    Spouse name: Not on file  . Number of children: Not on file  . Years of education: Not on file  . Highest education level: Not on file  Occupational History  . Not on file  Tobacco Use  . Smoking status: Current Every Day Smoker    Years: 1.00    Types: Cigarettes  . Smokeless tobacco: Never Used  . Tobacco comment: smokes 4 cig daily  Vaping Use  . Vaping Use: Never used  Substance and Sexual Activity  . Alcohol use:  No  . Drug use: No  . Sexual activity: Not Currently    Birth control/protection: None  Other Topics Concern  . Not on file  Social History Narrative  . Not on file   Social Determinants of Health   Financial Resource Strain: Not on file  Food Insecurity: Not on file  Transportation Needs: Not on file  Physical Activity: Not on file  Stress: Not on file  Social Connections: Not on file   Outpatient Encounter Medications as of 07/10/2020  Medication Sig  . aspirin 81 MG chewable tablet Chew 81 mg by mouth daily.  Marland Kitchen atorvastatin (LIPITOR) 40 MG tablet Take 1 tablet by mouth once daily  . Blood Glucose Monitoring Suppl (ACCU-CHEK AVIVA PLUS) w/Device KIT USE AS DIRECTED  . cetirizine (ZYRTEC) 10 MG tablet Take 10 mg by mouth at bedtime.   . cycloSPORINE (RESTASIS) 0.05 % ophthalmic emulsion 1 drop 2 (two) times daily.  . Dulaglutide (TRULICITY) 1.5 ZO/1.0RU SOPN INJECT 1.5MG SUBCUTANEOUSLY ONCE A WEEK  . fenofibrate (TRICOR) 145 MG tablet Take 1 tablet (145 mg total) by mouth daily.  Marland Kitchen gabapentin (NEURONTIN) 300 MG capsule Take 1 capsule (300 mg total) by mouth at bedtime.  Marland Kitchen glucose blood (ACCU-CHEK GUIDE) test strip Use as directed to check blood glucose three times daily  . ibuprofen (  ADVIL,MOTRIN) 200 MG tablet Take 400 mg by mouth every 6 (six) hours as needed for headache or moderate pain.  . Vitamin D, Ergocalciferol, (DRISDOL) 1.25 MG (50000 UNIT) CAPS capsule Take 1 capsule (50,000 Units total) by mouth every 7 (seven) days.  . [DISCONTINUED] EUTHYROX 75 MCG tablet Take 1 tablet by mouth once daily  . levothyroxine (EUTHYROX) 50 MCG tablet Take 1.5 tablets (75 mcg total) by mouth daily.   No facility-administered encounter medications on file as of 07/10/2020.    ALLERGIES: Allergies  Allergen Reactions  . Penicillins Anaphylaxis, Hives and Other (See Comments)    Childhood Reaction. Has patient had a PCN reaction causing immediate rash, facial/tongue/throat swelling,  SOB or lightheadedness with hypotension: Yes Has patient had a PCN reaction causing severe rash involving mucus membranes or skin necrosis: No Has patient had a PCN reaction that required hospitalization: No Has patient had a PCN reaction occurring within the last 10 years: No If all of the above answers are "NO", then may proceed with Cephalosporin use.   . Niaspan [Niacin] Other (See Comments)    Patient passes out.    VACCINATION STATUS: Immunization History  Administered Date(s) Administered  . Influenza,inj,Quad PF,6+ Mos 11/28/2012, 01/22/2014, 12/26/2014, 12/07/2015, 12/15/2016, 01/02/2018, 01/07/2019, 01/23/2020  . PFIZER(Purple Top)SARS-COV-2 Vaccination 06/29/2019, 07/20/2019, 01/23/2020  . Pneumococcal Polysaccharide-23 01/07/2019  . Tdap 03/19/2013   Diabetes She presents for her follow-up diabetic visit. She has type 2 diabetes mellitus. Onset time: Diagnosed at approx age of 59. Her disease course has been worsening. Pertinent negatives for hypoglycemia include no nervousness/anxiousness or tremors. There are no diabetic associated symptoms. Pertinent negatives for diabetes include no fatigue and no weight loss. There are no hypoglycemic complications. Symptoms are stable. Diabetic complications include nephropathy. Risk factors for coronary artery disease include diabetes mellitus, dyslipidemia, hypertension, obesity, sedentary lifestyle and tobacco exposure. Current diabetic treatments: Trulicity 1.5 SQ weekly. She is compliant with treatment all of the time. Her weight is increasing steadily. She is following a generally unhealthy diet. When asked about meal planning, she reported none. She has not had a previous visit with a dietitian. She rarely participates in exercise. Her overall blood glucose range is 130-140 mg/dl. (She presents today, accompanied by her mother, with her meter, no logs showing above target fasting glucose readings.  Her POCT A1c today is 7.6%, increasing  from previous visit of 5.9%.  She reports she is going through perimenopause now and has not done as well with diet and exercise through the winter months.  She denies any hypoglycemia.) An ACE inhibitor/angiotensin II receptor blocker is not being taken. She does not see a podiatrist.Eye exam is current.  Thyroid Problem Presents for follow-up visit. Symptoms include heat intolerance and weight gain. Patient reports no anxiety, cold intolerance, constipation, depressed mood, diarrhea, fatigue, leg swelling, palpitations, tremors or weight loss. The symptoms have been stable. Her past medical history is significant for diabetes and hyperlipidemia.  Hyperlipidemia This is a chronic problem. The current episode started more than 1 year ago. The problem is uncontrolled. Recent lipid tests were reviewed and are variable. Exacerbating diseases include chronic renal disease, diabetes, hypothyroidism and obesity. Factors aggravating her hyperlipidemia include fatty foods. Current antihyperlipidemic treatment includes statins and fibric acid derivatives. The current treatment provides moderate improvement of lipids. There are no compliance problems.  Risk factors for coronary artery disease include diabetes mellitus, dyslipidemia, hypertension, obesity and a sedentary lifestyle.    Review of systems  Constitutional: + steadily increasing body weight,  current Body mass index is 37.41 kg/m. , no fatigue, + subjective hyperthermia, no subjective hypothermia Eyes: no blurry vision, no xerophthalmia ENT: no sore throat, no nodules palpated in throat, no dysphagia/odynophagia, no hoarseness Cardiovascular: no chest pain, no shortness of breath, no palpitations, no leg swelling Respiratory: no cough, no shortness of breath Gastrointestinal: no nausea/vomiting/diarrhea Musculoskeletal: no muscle/joint aches Skin: no rashes, no hyperemia Neurological: no tremors, no numbness, no tingling, no  dizziness Psychiatric: no depression, no anxiety   Objective:    BP 121/83 (BP Location: Right Arm, Patient Position: Sitting)   Pulse 78   Ht 5' 1"  (1.549 m)   Wt 198 lb (89.8 kg)   BMI 37.41 kg/m   Wt Readings from Last 3 Encounters:  07/10/20 198 lb (89.8 kg)  03/09/20 191 lb (86.6 kg)  01/23/20 189 lb 6.4 oz (85.9 kg)    BP Readings from Last 3 Encounters:  07/10/20 121/83  03/09/20 126/75  01/23/20 123/81     Physical Exam- Limited  Constitutional:  Body mass index is 37.41 kg/m. , not in acute distress, normal state of mind Eyes:  EOMI, no exophthalmos Neck: Supple Cardiovascular: RRR, no murmers, rubs, or gallops, no edema Respiratory: Adequate breathing efforts, no crackles, rales, rhonchi, or wheezing Musculoskeletal: no gross deformities, strength intact in all four extremities, no gross restriction of joint movements Skin:  no rashes, no hyperemia Neurological: no tremor with outstretched hands      CMP     Component Value Date/Time   NA 138 01/23/2020 1126   K 4.5 01/23/2020 1126   CL 104 01/23/2020 1126   CO2 22 01/23/2020 1126   GLUCOSE 111 (H) 01/23/2020 1126   GLUCOSE 438 (H) 06/06/2017 0811   BUN 10 01/23/2020 1126   CREATININE 1.27 (H) 01/23/2020 1126   CREATININE 1.13 (H) 08/17/2012 1031   CALCIUM 9.1 01/23/2020 1126   PROT 6.0 01/23/2020 1126   ALBUMIN 3.9 01/23/2020 1126   AST 20 01/23/2020 1126   ALT 15 01/23/2020 1126   ALKPHOS 67 01/23/2020 1126   BILITOT 0.5 01/23/2020 1126   GFRNONAA 50 (L) 01/23/2020 1126   GFRNONAA 61 08/17/2012 1031   GFRAA 58 (L) 01/23/2020 1126   GFRAA 70 08/17/2012 1031     Diabetic Labs (most recent): Lab Results  Component Value Date   HGBA1C 7.6 (A) 07/10/2020   HGBA1C 5.9 01/23/2020   HGBA1C 5.4 07/26/2019     Lipid Panel ( most recent) Lipid Panel     Component Value Date/Time   CHOL 113 01/23/2020 1123   CHOL 85 08/17/2012 1031   TRIG 188 (H) 01/23/2020 1123   TRIG 161 (H)  08/17/2012 1031   HDL 29 (L) 01/23/2020 1123   HDL 26 (L) 08/17/2012 1031   CHOLHDL 3.9 01/23/2020 1123   CHOLHDL 10.3 11/29/2012 1030   VLDL 39 11/29/2012 1030   LDLCALC 53 01/23/2020 1123   LDLCALC 27 08/17/2012 1031     Lab Results  Component Value Date   TSH 0.671 06/29/2020   TSH 1.080 01/23/2020   TSH 1.320 07/26/2019   TSH 0.619 04/22/2019   TSH 0.477 01/25/2019   TSH 0.854 10/15/2018   TSH 1.130 04/16/2018   TSH 0.346 (L) 01/02/2018   TSH 0.637 09/04/2017   TSH 1.820 12/15/2016   FREET4 2.22 (H) 06/29/2020   FREET4 1.38 01/23/2020   FREET4 1.91 (H) 04/22/2019   FREET4 1.69 10/15/2018   FREET4 1.43 04/16/2018   FREET4 1.68 01/02/2018   FREET4 1.52  09/04/2017   FREET4 1.19 11/14/2014      Assessment & Plan:   1) Type 2 diabetes mellitus with stage 3 chronic kidney disease, without long-term current use of insulin ([place])  - Tonye L Shimmel has currently controlled type 2 DM since 50 years of age.   She presents today, accompanied by her mother, with her meter, no logs showing above target fasting glucose readings.  Her POCT A1c today is 7.6%, increasing from previous visit of 5.9%.  She reports she is going through perimenopause now and has not done as well with diet and exercise through the winter months.  She denies any hypoglycemia.  -her diabetes is complicated by obesity/sedentary life, CKD, smoking, and Wyona L Tabron remains at a high risk for more acute and chronic complications which include CAD, CVA, CKD, retinopathy, and neuropathy. These are all discussed in detail with the patient.  - Nutritional counseling repeated at each appointment due to patients tendency to fall back in to old habits.  - The patient admits there is a room for improvement in their diet and drink choices. -  Suggestion is made for the patient to avoid simple carbohydrates from their diet including Cakes, Sweet Desserts / Pastries, Ice Cream, Soda (diet and regular), Sweet Tea,  Candies, Chips, Cookies, Sweet Pastries,  Store Bought Juices, Alcohol in Excess of  1-2 drinks a day, Artificial Sweeteners, Coffee Creamer, and "Sugar-free" Products. This will help patient to have stable blood glucose profile and potentially avoid unintended weight gain.   - I encouraged the patient to switch to  unprocessed or minimally processed complex starch and increased protein intake (animal or plant source), fruits, and vegetables.   - Patient is advised to stick to a routine mealtimes to eat 3 meals  a day and avoid unnecessary snacks ( to snack only to correct hypoglycemia).  - I have approached her with the following individualized plan to manage diabetes and patient agrees:   -Despite her recent loss of control, she would not need insulin treatment for now.    -She is advised to continue Trulicity 1.5 mg SQ weekly and work on diet and exercise.  We discussed adding Glipizide at next visit if she is unable to improve her A1c.  -Patient seems to have significant cognitive deficit, maximizing non-insulin therapeutic options would be entertained before considering insulin treatment.   - Patient specific target  A1c;  LDL, HDL, Triglycerides  were discussed in detail.  2) BP/HTN:  -Her blood pressure is controlled to target without the use of antihypertensive medications.  3) Lipids/HPL:    Her most recent lipid panel from 01/23/20 shows controlled LDL of 31 and elevated triglycerides of 188.  She is advised to continue Atorvastatin 40 mg po daily at bedtime.  Side effects and precautions discussed with her.  She is advised to avoid fried foods and butter.  4)  Weight/Diet:  Her Body mass index is 37.41 kg/m.-she is a candidate for modest weight loss.  CDE Consult has been  initiated , exercise, and detailed carbohydrates information provided.  5) Hypothyroidism: -Diagnosed at approximate age of 57 years. Her previsit thyroid function tests are consistent with over-replacement.   She is advised to lower her dose of Levothyroxine to 50 mcg po daily before breakfast.    - We discussed about the correct intake of her thyroid hormone, on empty stomach at fasting, with water, separated by at least 30 minutes from breakfast and other medications,  and separated by more  than 4 hours from calcium, iron, multivitamins, acid reflux medications (PPIs). -Patient is made aware of the fact that thyroid hormone replacement is needed for life, dose to be adjusted by periodic monitoring of thyroid function tests.  6) Chronic Care/Health Maintenance: -she is on statin medications and is encouraged to continue to follow up with Ophthalmology, Dentist,  Podiatrist at least yearly or according to recommendations, and advised to away from smoking.    I have recommended yearly flu vaccine and pneumonia vaccination at least every 5 years; moderate intensity exercise for up to 150 minutes weekly; and  sleep for at least 7 hours a day.   - I advised patient to maintain close follow up with Dettinger, Fransisca Kaufmann, MD for primary care needs.    I spent 40 minutes in the care of the patient today including review of labs from Home, Lipids, Thyroid Function, Hematology (current and previous including abstractions from other facilities); face-to-face time discussing  her blood glucose readings/logs, discussing hypoglycemia and hyperglycemia episodes and symptoms, medications doses, her options of short and long term treatment based on the latest standards of care / guidelines;  discussion about incorporating lifestyle medicine;  and documenting the encounter.    Please refer to Patient Instructions for Blood Glucose Monitoring and Insulin/Medications Dosing Guide"  in media tab for additional information. Please  also refer to " Patient Self Inventory" in the Media  tab for reviewed elements of pertinent patient history.  Wendi Maya Shehan participated in the discussions, expressed understanding, and voiced  agreement with the above plans.  All questions were answered to her satisfaction. she is encouraged to contact clinic should she have any questions or concerns prior to her return visit.   Follow up plan: - Return in about 3 months (around 10/09/2020) for Diabetes follow up- A1c and urine micro in office, Previsit labs, Bring glucometer and logs.  Rayetta Pigg, [facility] [address] [place],  [address] Phone: [phone] Fax: [phone]  07/10/2020, 9:13 AM

## 2020-07-20 ENCOUNTER — Other Ambulatory Visit: Payer: Self-pay | Admitting: Nurse Practitioner

## 2020-07-21 ENCOUNTER — Other Ambulatory Visit: Payer: Self-pay | Admitting: Family Medicine

## 2020-07-21 DIAGNOSIS — I10 Essential (primary) hypertension: Secondary | ICD-10-CM

## 2020-07-21 DIAGNOSIS — E785 Hyperlipidemia, unspecified: Secondary | ICD-10-CM

## 2020-07-21 DIAGNOSIS — E1142 Type 2 diabetes mellitus with diabetic polyneuropathy: Secondary | ICD-10-CM

## 2020-07-22 ENCOUNTER — Ambulatory Visit: Payer: Medicaid Other | Admitting: Family Medicine

## 2020-07-30 ENCOUNTER — Other Ambulatory Visit: Payer: Self-pay

## 2020-07-30 ENCOUNTER — Ambulatory Visit: Payer: Medicaid Other | Admitting: Family Medicine

## 2020-07-30 ENCOUNTER — Encounter: Payer: Self-pay | Admitting: Family Medicine

## 2020-07-30 VITALS — BP 117/87 | HR 82 | Ht 61.0 in | Wt 197.0 lb

## 2020-07-30 DIAGNOSIS — Z1211 Encounter for screening for malignant neoplasm of colon: Secondary | ICD-10-CM

## 2020-07-30 DIAGNOSIS — E039 Hypothyroidism, unspecified: Secondary | ICD-10-CM

## 2020-07-30 DIAGNOSIS — E785 Hyperlipidemia, unspecified: Secondary | ICD-10-CM | POA: Diagnosis not present

## 2020-07-30 DIAGNOSIS — N1832 Chronic kidney disease, stage 3b: Secondary | ICD-10-CM | POA: Diagnosis not present

## 2020-07-30 DIAGNOSIS — I10 Essential (primary) hypertension: Secondary | ICD-10-CM

## 2020-07-30 DIAGNOSIS — E782 Mixed hyperlipidemia: Secondary | ICD-10-CM | POA: Diagnosis not present

## 2020-07-30 DIAGNOSIS — E1122 Type 2 diabetes mellitus with diabetic chronic kidney disease: Secondary | ICD-10-CM

## 2020-07-30 DIAGNOSIS — E1165 Type 2 diabetes mellitus with hyperglycemia: Secondary | ICD-10-CM

## 2020-07-30 DIAGNOSIS — E1142 Type 2 diabetes mellitus with diabetic polyneuropathy: Secondary | ICD-10-CM

## 2020-07-30 MED ORDER — ATORVASTATIN CALCIUM 40 MG PO TABS: 1.0000 | ORAL_TABLET | Freq: Every day | ORAL | 3 refills | Status: DC

## 2020-07-30 NOTE — Progress Notes (Signed)
BP 117/87   Pulse 82   Ht _0  (1.549 m)   Wt 197 lb (89.4 kg)   SpO2 98%   BMI 37.22 kg/m    Subjective:   Patient ID: Diane Morgan, female    DOB: [birth year], 50 y.o.   MRN: [id number]  HPI: Diane Morgan is a 50 y.o. female presenting on 07/30/2020 for Medical Management of Chronic Issues and Diabetes   HPI Diabetes and thyroid are managed by her endocrinology, last A1c couple weeks ago was 7.6 and diabetes within normal limits for TSH.  Hypothyroidism recheck Patient is coming in for thyroid recheck today as well. They deny any issues with hair changes or heat or cold problems or diarrhea or constipation. They deny any chest pain or palpitations. They are currently on levothyroxine 75 micrograms   Hypertension Patient is currently on no medication currently has been diet controlled, and their blood pressure today is 117/87. Patient denies any lightheadedness or dizziness. Patient denies headaches, blurred vision, chest pains, shortness of breath, or weakness. Denies any side effects from medication and is content with current medication.   Hyperlipidemia Patient is coming in for recheck of his hyperlipidemia. The patient is currently taking fenofibrate and atorvastatin. They deny any issues with myalgias or history of liver damage from it. They deny any focal numbness or weakness or chest pain.  Patient is due for colon cancer screening wants to do Cologuard.  Relevant past medical, surgical, family and social history reviewed and updated as indicated. Interim medical history since our last visit reviewed. Allergies and medications reviewed and updated.  Review of Systems  Constitutional: Negative for chills and fever.  HENT: Negative for ear pain.   Eyes: Negative for redness and visual disturbance.  Respiratory: Negative for chest tightness and shortness of breath.   Cardiovascular: Negative for chest pain and leg swelling.  Genitourinary: Negative for difficulty  urinating and dysuria.  Musculoskeletal: Negative for back pain and gait problem.  Skin: Negative for rash.  Neurological: Negative for light-headedness and headaches.  Psychiatric/Behavioral: Negative for agitation and behavioral problems.  All other systems reviewed and are negative.   Per HPI unless specifically indicated above   Allergies as of 07/30/2020      Reactions   Penicillins Anaphylaxis, Hives, Other (See Comments)   Childhood Reaction. Has patient had a PCN reaction causing immediate rash, facial/tongue/throat swelling, SOB or lightheadedness with hypotension: Yes Has patient had a PCN reaction causing severe rash involving mucus membranes or skin necrosis: No Has patient had a PCN reaction that required hospitalization: No Has patient had a PCN reaction occurring within the last 10 years: No If all of the above answers are "NO", then may proceed with Cephalosporin use.   Niaspan [niacin] Other (See Comments)   Patient passes out.      Medication List       Accurate as of Jul 30, 2020  2:35 PM. If you have any questions, ask your nurse or doctor.        Accu-Chek Aviva Plus w/Device Kit USE AS DIRECTED   Accu-Chek Guide test strip Generic drug: glucose blood Use as directed to check blood glucose three times daily   aspirin 81 MG chewable tablet Chew 81 mg by mouth daily.   atorvastatin 40 MG tablet Commonly known as: LIPITOR Take 1 tablet by mouth once daily   cetirizine 10 MG tablet Commonly known as: ZYRTEC Take 10 mg by mouth at bedtime.   cycloSPORINE 0.05 %  ophthalmic emulsion Commonly known as: RESTASIS 1 drop 2 (two) times daily.   fenofibrate 145 MG tablet Commonly known as: TRICOR Take 1 tablet (145 mg total) by mouth daily.   gabapentin 300 MG capsule Commonly known as: NEURONTIN Take 1 capsule (300 mg total) by mouth at bedtime.   ibuprofen 200 MG tablet Commonly known as: ADVIL Take 400 mg by mouth every 6 (six) hours as needed  for headache or moderate pain.   levothyroxine 50 MCG tablet Commonly known as: Euthyrox Take 1.5 tablets (75 mcg total) by mouth daily.   Trulicity 1.5 XB/3.5HG Sopn Generic drug: Dulaglutide INJECT 1 DOSE SUBCUTANEOUSLY ONCE A WEEK   Vitamin D (Ergocalciferol) 1.25 MG (50000 UNIT) Caps capsule Commonly known as: DRISDOL Take 1 capsule (50,000 Units total) by mouth every 7 (seven) days.        Objective:   BP 117/87   Pulse 82   Ht _0  (1.549 m)   Wt 197 lb (89.4 kg)   SpO2 98%   BMI 37.22 kg/m   Wt Readings from Last 3 Encounters:  07/30/20 197 lb (89.4 kg)  07/10/20 198 lb (89.8 kg)  03/09/20 191 lb (86.6 kg)    Physical Exam Vitals and nursing note reviewed.  Constitutional:      General: She is not in acute distress.    Appearance: She is well-developed. She is not diaphoretic.  Eyes:     Extraocular Movements: Extraocular movements intact.     Conjunctiva/sclera: Conjunctivae normal.     Pupils: Pupils are equal, round, and reactive to light.  Cardiovascular:     Rate and Rhythm: Normal rate and regular rhythm.     Heart sounds: Normal heart sounds. No murmur heard.   Pulmonary:     Effort: Pulmonary effort is normal. No respiratory distress.     Breath sounds: Normal breath sounds. No wheezing.  Musculoskeletal:        General: No tenderness. Normal range of motion.  Skin:    General: Skin is warm and dry.     Findings: No rash.  Neurological:     Mental Status: She is alert and oriented to person, place, and time.     Coordination: Coordination normal.  Psychiatric:        Behavior: Behavior normal.     Results for orders placed or performed in visit on 07/10/20  HgB A1c  Result Value Ref Range   Hemoglobin A1C     HbA1c POC (<> result, manual entry)     HbA1c, POC (prediabetic range)     HbA1c, POC (controlled diabetic range) 7.6 (A) 0.0 - 7.0 %    Assessment & Plan:   Problem List Items Addressed This Visit      Cardiovascular and  Mediastinum   Hypertension   Relevant Medications   atorvastatin (LIPITOR) 40 MG tablet     Endocrine   Type 2 diabetes mellitus with stage 3 chronic kidney disease, without long-term current use of insulin (HCC) - Primary   Relevant Medications   atorvastatin (LIPITOR) 40 MG tablet   Other Relevant Orders   CBC with Differential/Platelet   CMP14+EGFR   Lipid panel   Hypothyroidism   Relevant Orders   CBC with Differential/Platelet   CMP14+EGFR   Lipid panel   Type 2 diabetes mellitus with diabetic polyneuropathy (HCC)   Relevant Medications   atorvastatin (LIPITOR) 40 MG tablet   Other Relevant Orders   CBC with Differential/Platelet   CMP14+EGFR   Lipid  panel   Uncontrolled type 2 diabetes mellitus with hyperglycemia (HCC)   Relevant Medications   atorvastatin (LIPITOR) 40 MG tablet     Other   Hyperlipidemia   Relevant Medications   atorvastatin (LIPITOR) 40 MG tablet   Other Relevant Orders   Lipid panel    Other Visit Diagnoses    Colon cancer screening       Relevant Orders   Cologuard   Dyslipidemia       Relevant Medications   atorvastatin (LIPITOR) 40 MG tablet   Other Relevant Orders   Lipid panel   Essential hypertension       Relevant Medications   atorvastatin (LIPITOR) 40 MG tablet      Continue current medication, no changes, has followed up with endocrinology for the diabetes and the thyroid.  We will send Cologuard for her.  We will do some basic blood work here but they have not been with endocrinology yet. Follow up plan: Return in about 6 months (around 01/30/2021), or if symptoms worsen or fail to improve, for Thyroid and hypertension and diabetes..  Counseling provided for all of the vaccine components No orders of the defined types were placed in this encounter.   Caryl Pina, MD [facility] 07/30/2020, 2:35 PM

## 2020-07-31 LAB — CMP14+EGFR
ALT: 14 IU/L (ref 0–32)
AST: 16 IU/L (ref 0–40)
Albumin/Globulin Ratio: 1.8 (ref 1.2–2.2)
Albumin: 4.1 g/dL (ref 3.8–4.8)
Alkaline Phosphatase: 73 IU/L (ref 44–121)
BUN/Creatinine Ratio: 11 (ref 9–23)
BUN: 15 mg/dL (ref 6–24)
Bilirubin Total: 0.5 mg/dL (ref 0.0–1.2)
CO2: 23 mmol/L (ref 20–29)
Calcium: 9.4 mg/dL (ref 8.7–10.2)
Chloride: 101 mmol/L (ref 96–106)
Creatinine, Ser: 1.35 mg/dL — ABNORMAL HIGH (ref 0.57–1.00)
Globulin, Total: 2.3 g/dL (ref 1.5–4.5)
Glucose: 100 mg/dL — ABNORMAL HIGH (ref 65–99)
Potassium: 4.9 mmol/L (ref 3.5–5.2)
Sodium: 138 mmol/L (ref 134–144)
Total Protein: 6.4 g/dL (ref 6.0–8.5)
eGFR: 48 mL/min/{1.73_m2} — ABNORMAL LOW (ref >59)

## 2020-07-31 LAB — CBC WITH DIFFERENTIAL/PLATELET
Basophils Absolute: 0.1 10*3/uL (ref 0.0–0.2)
Basos: 1 %
EOS (ABSOLUTE): 0.4 10*3/uL (ref 0.0–0.4)
Eos: 3 %
Hematocrit: 42.8 % (ref 34.0–46.6)
Hemoglobin: 14.7 g/dL (ref 11.1–15.9)
Immature Grans (Abs): 0.1 10*3/uL (ref 0.0–0.1)
Immature Granulocytes: 1 %
Lymphocytes Absolute: 3.3 10*3/uL — ABNORMAL HIGH (ref 0.7–3.1)
Lymphs: 27 %
MCH: 29.5 pg (ref 26.6–33.0)
MCHC: 34.3 g/dL (ref 31.5–35.7)
MCV: 86 fL (ref 79–97)
Monocytes Absolute: 0.7 10*3/uL (ref 0.1–0.9)
Monocytes: 6 %
Neutrophils Absolute: 7.4 10*3/uL — ABNORMAL HIGH (ref 1.4–7.0)
Neutrophils: 62 %
Platelets: 286 10*3/uL (ref 150–450)
RBC: 4.98 x10E6/uL (ref 3.77–5.28)
RDW: 13 % (ref 11.7–15.4)
WBC: 12 10*3/uL — ABNORMAL HIGH (ref 3.4–10.8)

## 2020-07-31 LAB — LIPID PANEL
Chol/HDL Ratio: 3.1 ratio (ref 0.0–4.4)
Cholesterol, Total: 103 mg/dL (ref 100–199)
HDL: 33 mg/dL — ABNORMAL LOW (ref >39)
LDL Chol Calc (NIH): 39 mg/dL (ref 0–99)
Triglycerides: 187 mg/dL — ABNORMAL HIGH (ref 0–149)
VLDL Cholesterol Cal: 31 mg/dL (ref 5–40)

## 2020-08-06 ENCOUNTER — Telehealth: Payer: Self-pay | Admitting: Family Medicine

## 2020-08-06 NOTE — Telephone Encounter (Signed)
Please review labs and advise.

## 2020-08-18 ENCOUNTER — Other Ambulatory Visit: Payer: Self-pay | Admitting: "Endocrinology

## 2020-09-14 ENCOUNTER — Other Ambulatory Visit: Payer: Self-pay | Admitting: "Endocrinology

## 2020-09-28 ENCOUNTER — Other Ambulatory Visit: Payer: Self-pay

## 2020-09-28 ENCOUNTER — Other Ambulatory Visit: Payer: Medicaid Other

## 2020-09-28 DIAGNOSIS — E1165 Type 2 diabetes mellitus with hyperglycemia: Secondary | ICD-10-CM | POA: Diagnosis not present

## 2020-09-29 LAB — COMPREHENSIVE METABOLIC PANEL
ALT: 18 IU/L (ref 0–32)
AST: 21 IU/L (ref 0–40)
Albumin/Globulin Ratio: 1.7 (ref 1.2–2.2)
Albumin: 3.8 g/dL (ref 3.8–4.8)
Alkaline Phosphatase: 83 IU/L (ref 44–121)
BUN/Creatinine Ratio: 14 (ref 9–23)
BUN: 21 mg/dL (ref 6–24)
Bilirubin Total: 0.3 mg/dL (ref 0.0–1.2)
CO2: 20 mmol/L (ref 20–29)
Calcium: 9.7 mg/dL (ref 8.7–10.2)
Chloride: 104 mmol/L (ref 96–106)
Creatinine, Ser: 1.49 mg/dL — ABNORMAL HIGH (ref 0.57–1.00)
Globulin, Total: 2.2 g/dL (ref 1.5–4.5)
Glucose: 186 mg/dL — ABNORMAL HIGH (ref 65–99)
Potassium: 5.1 mmol/L (ref 3.5–5.2)
Sodium: 142 mmol/L (ref 134–144)
Total Protein: 6 g/dL (ref 6.0–8.5)
eGFR: 43 mL/min/{1.73_m2} — ABNORMAL LOW (ref >59)

## 2020-10-08 NOTE — Patient Instructions (Signed)

## 2020-10-09 ENCOUNTER — Other Ambulatory Visit: Payer: Self-pay

## 2020-10-09 ENCOUNTER — Encounter: Payer: Self-pay | Admitting: Nurse Practitioner

## 2020-10-09 ENCOUNTER — Ambulatory Visit: Payer: Medicaid Other | Admitting: Nurse Practitioner

## 2020-10-09 VITALS — BP 125/84 | HR 80 | Ht 61.0 in | Wt 196.1 lb

## 2020-10-09 DIAGNOSIS — E559 Vitamin D deficiency, unspecified: Secondary | ICD-10-CM | POA: Diagnosis not present

## 2020-10-09 DIAGNOSIS — E039 Hypothyroidism, unspecified: Secondary | ICD-10-CM | POA: Diagnosis not present

## 2020-10-09 DIAGNOSIS — I10 Essential (primary) hypertension: Secondary | ICD-10-CM | POA: Diagnosis not present

## 2020-10-09 DIAGNOSIS — E785 Hyperlipidemia, unspecified: Secondary | ICD-10-CM

## 2020-10-09 DIAGNOSIS — E1165 Type 2 diabetes mellitus with hyperglycemia: Secondary | ICD-10-CM

## 2020-10-09 LAB — POCT UA - MICROALBUMIN: Microalbumin Ur, POC: 150 mg/L

## 2020-10-09 LAB — POCT GLYCOSYLATED HEMOGLOBIN (HGB A1C): Hemoglobin A1C: 7.9 % — AB (ref 4.0–5.6)

## 2020-10-09 NOTE — Progress Notes (Signed)
10/09/2020, 9:34 AM        Endocrinology follow-up note    Subjective:    Patient ID: Diane Morgan, female    DOB: 1970/07/15.  Diane Morgan is being iseen in follow-up for management of currently controlled symptomatic type 2 diabetes, hypothyroidism, hyperlipidemia, hypertension. PMD:  Dettinger, Fransisca Kaufmann, MD.    Past Medical History:  Diagnosis Date  . Anemia   . Arthritis   . Cataract    right  . Cellulitis and abscess of trunk 11/28/2012  . Diabetic peripheral neuropathy ([place])   . DM (diabetes mellitus) ([place])   . Eczema   . GERD (gastroesophageal reflux disease)   . Hyperlipidemia   . Hypertension   . Hypothyroidism   . Leg pain, bilateral 10/08/09  . NIDDM (non-insulin dependent diabetes mellitus)   . URI (upper respiratory infection) 07/01/10   Past Surgical History:  Procedure Laterality Date  . CATARACT EXTRACTION W/PHACO Right 06/12/2017   Procedure: CATARACT EXTRACTION WITH PHACOEMULSIFICATION  AND INTRAOCULAR LENS PLACEMENT RIGHT EYE;  Surgeon: Tonny Branch, MD;  Location: [facility];  Service: Ophthalmology;  Laterality: Right;  CDE: 1.16  . CHOLECYSTECTOMY    . EYE SURGERY     right catracts  . EYE SURGERY  12/2019   left cataract  . LASER ABLATION CONDOLAMATA N/A 03/08/2017   Procedure: LASER ABLATION OF THE CERVIX;  Surgeon: Florian Buff, MD;  Location: [facility];  Service: Gynecology;  Laterality: N/A;  . WISDOM TOOTH EXTRACTION     Social History   Socioeconomic History  . Marital status: Single    Spouse name: Not on file  . Number of children: Not on file  . Years of education: Not on file  . Highest education level: Not on file  Occupational History  . Not on file  Tobacco Use  . Smoking status: Every Day    Years: 1.00    Types: Cigarettes  . Smokeless tobacco: Never  . Tobacco comments:    smokes 4 cig daily  Vaping Use  . Vaping Use: Never used  Substance and Sexual Activity  . Alcohol use: No  . Drug  use: No  . Sexual activity: Not Currently    Birth control/protection: None  Other Topics Concern  . Not on file  Social History Narrative  . Not on file   Social Determinants of Health   Financial Resource Strain: Not on file  Food Insecurity: Not on file  Transportation Needs: Not on file  Physical Activity: Not on file  Stress: Not on file  Social Connections: Not on file   Outpatient Encounter Medications as of 10/09/2020  Medication Sig  . aspirin 81 MG chewable tablet Chew 81 mg by mouth daily.  Marland Kitchen atorvastatin (LIPITOR) 40 MG tablet Take 1 tablet (40 mg total) by mouth daily.  . Blood Glucose Monitoring Suppl (ACCU-CHEK AVIVA PLUS) w/Device KIT USE AS DIRECTED  . cetirizine (ZYRTEC) 10 MG tablet Take 10 mg by mouth at bedtime.   . cycloSPORINE (RESTASIS) 0.05 % ophthalmic emulsion 1 drop 2 (two) times daily.  . fenofibrate (TRICOR) 145 MG tablet Take 1 tablet (145 mg total) by mouth daily.  Marland Kitchen gabapentin (NEURONTIN) 300 MG capsule Take 1 capsule (300 mg total) by mouth at bedtime.  Marland Kitchen glucose blood (ACCU-CHEK GUIDE) test strip Use as directed to check blood glucose three times daily  . ibuprofen (ADVIL,MOTRIN) 200 MG tablet Take 400 mg by mouth every 6 (  six) hours as needed for headache or moderate pain.  Marland Kitchen levothyroxine (EUTHYROX) 50 MCG tablet Take 1.5 tablets (75 mcg total) by mouth daily.  . TRULICITY 1.5 WN/0.2VO SOPN INJECT 1 DOSE SUBCUTANEOUSLY ONCE A WEEK  . Vitamin D, Ergocalciferol, (DRISDOL) 1.25 MG (50000 UNIT) CAPS capsule Take 1 capsule (50,000 Units total) by mouth every 7 (seven) days.   No facility-administered encounter medications on file as of 10/09/2020.    ALLERGIES: Allergies  Allergen Reactions  . Penicillins Anaphylaxis, Hives and Other (See Comments)    Childhood Reaction. Has patient had a PCN reaction causing immediate rash, facial/tongue/throat swelling, SOB or lightheadedness with hypotension: Yes Has patient had a PCN reaction causing severe  rash involving mucus membranes or skin necrosis: No Has patient had a PCN reaction that required hospitalization: No Has patient had a PCN reaction occurring within the last 10 years: No If all of the above answers are "NO", then may proceed with Cephalosporin use.   . Niaspan [Niacin] Other (See Comments)    Patient passes out.    VACCINATION STATUS: Immunization History  Administered Date(s) Administered  . Influenza,inj,Quad PF,6+ Mos 11/28/2012, 01/22/2014, 12/26/2014, 12/07/2015, 12/15/2016, 01/02/2018, 01/07/2019, 01/23/2020  . PFIZER(Purple Top)SARS-COV-2 Vaccination 06/29/2019, 07/20/2019, 01/23/2020  . Pneumococcal Polysaccharide-23 01/07/2019  . Tdap 03/19/2013   Diabetes She presents for her follow-up diabetic visit. She has type 2 diabetes mellitus. Onset time: Diagnosed at approx age of 50. Her disease course has been worsening. Pertinent negatives for hypoglycemia include no nervousness/anxiousness or tremors. There are no diabetic associated symptoms. Pertinent negatives for diabetes include no fatigue and no weight loss. There are no hypoglycemic complications. Symptoms are stable. Diabetic complications include nephropathy. Risk factors for coronary artery disease include diabetes mellitus, dyslipidemia, hypertension, obesity, sedentary lifestyle and tobacco exposure. Current diabetic treatments: Trulicity 1.5 SQ weekly. She is compliant with treatment all of the time. Her weight is increasing steadily. She is following a generally unhealthy diet. When asked about meal planning, she reported none. She has not had a previous visit with a dietitian. She rarely participates in exercise. Her home blood glucose trend is fluctuating minimally. Her overall blood glucose range is 130-140 mg/dl. (She presents today with her meter, no logs, showing stable fasting glycemic profile near target.  Her POCT A1c today is 7.9%, increasing from previous visit of 7.6%.  I suspect she is having  postprandial hyperglycemia.  She does engage in routine physical activity and eats generally healthy.  She denies any s/s of hypoglycemia.) An ACE inhibitor/angiotensin II receptor blocker is not being taken. She does not see a podiatrist.Eye exam is current.  Thyroid Problem Presents for follow-up visit. Symptoms include heat intolerance and weight gain. Patient reports no anxiety, cold intolerance, constipation, depressed mood, diarrhea, fatigue, leg swelling, palpitations, tremors or weight loss. The symptoms have been stable. Her past medical history is significant for diabetes and hyperlipidemia.  Hyperlipidemia This is a chronic problem. The current episode started more than 1 year ago. The problem is uncontrolled. Recent lipid tests were reviewed and are variable. Exacerbating diseases include chronic renal disease, diabetes, hypothyroidism and obesity. Factors aggravating her hyperlipidemia include fatty foods. Current antihyperlipidemic treatment includes statins and fibric acid derivatives. The current treatment provides moderate improvement of lipids. There are no compliance problems.  Risk factors for coronary artery disease include diabetes mellitus, dyslipidemia, hypertension, obesity and a sedentary lifestyle.    Review of systems  Constitutional: + Minimally fluctuating body weight,  current Body mass index is 37.05 kg/m. ,  no fatigue, no subjective hyperthermia, no subjective hypothermia Eyes: no blurry vision, no xerophthalmia ENT: no sore throat, no nodules palpated in throat, no dysphagia/odynophagia, no hoarseness Cardiovascular: no chest pain, no shortness of breath, no palpitations, no leg swelling Respiratory: no cough, no shortness of breath Gastrointestinal: no nausea/vomiting/diarrhea Musculoskeletal: no muscle/joint aches Skin: no rashes, no hyperemia Neurological: no tremors, no numbness, no tingling, no dizziness Psychiatric: no depression, no anxiety   Objective:     BP 125/84   Pulse 80   Ht 5' 1"  (1.549 m)   Wt 196 lb 1.6 oz (89 kg)   BMI 37.05 kg/m   Wt Readings from Last 3 Encounters:  10/09/20 196 lb 1.6 oz (89 kg)  07/30/20 197 lb (89.4 kg)  07/10/20 198 lb (89.8 kg)    BP Readings from Last 3 Encounters:  10/09/20 125/84  07/30/20 117/87  07/10/20 121/83     Physical Exam- Limited  Constitutional:  Body mass index is 37.05 kg/m. , not in acute distress, normal state of mind Eyes:  EOMI, no exophthalmos Neck: Supple Cardiovascular: RRR, no murmurs, rubs, or gallops, no edema Respiratory: Adequate breathing efforts, no crackles, rales, rhonchi, or wheezing Musculoskeletal: no gross deformities, strength intact in all four extremities, no gross restriction of joint movements Skin:  no rashes, no hyperemia Neurological: no tremor with outstretched hands      CMP     Component Value Date/Time   NA 142 09/28/2020 0843   K 5.1 09/28/2020 0843   CL 104 09/28/2020 0843   CO2 20 09/28/2020 0843   GLUCOSE 186 (H) 09/28/2020 0843   GLUCOSE 438 (H) 06/06/2017 0811   BUN 21 09/28/2020 0843   CREATININE 1.49 (H) 09/28/2020 0843   CREATININE 1.13 (H) 08/17/2012 1031   CALCIUM 9.7 09/28/2020 0843   PROT 6.0 09/28/2020 0843   ALBUMIN 3.8 09/28/2020 0843   AST 21 09/28/2020 0843   ALT 18 09/28/2020 0843   ALKPHOS 83 09/28/2020 0843   BILITOT 0.3 09/28/2020 0843   GFRNONAA 50 (L) 01/23/2020 1126   GFRNONAA 61 08/17/2012 1031   GFRAA 58 (L) 01/23/2020 1126   GFRAA 70 08/17/2012 1031     Diabetic Labs (most recent): Lab Results  Component Value Date   HGBA1C 7.6 (A) 07/10/2020   HGBA1C 5.9 01/23/2020   HGBA1C 5.4 07/26/2019     Lipid Panel ( most recent) Lipid Panel     Component Value Date/Time   CHOL 103 07/30/2020 1457   CHOL 85 08/17/2012 1031   TRIG 187 (H) 07/30/2020 1457   TRIG 161 (H) 08/17/2012 1031   HDL 33 (L) 07/30/2020 1457   HDL 26 (L) 08/17/2012 1031   CHOLHDL 3.1 07/30/2020 1457   CHOLHDL 10.3  11/29/2012 1030   VLDL 39 11/29/2012 1030   LDLCALC 39 07/30/2020 1457   LDLCALC 27 08/17/2012 1031     Lab Results  Component Value Date   TSH 0.671 06/29/2020   TSH 1.080 01/23/2020   TSH 1.320 07/26/2019   TSH 0.619 04/22/2019   TSH 0.477 01/25/2019   TSH 0.854 10/15/2018   TSH 1.130 04/16/2018   TSH 0.346 (L) 01/02/2018   TSH 0.637 09/04/2017   TSH 1.820 12/15/2016   FREET4 2.22 (H) 06/29/2020   FREET4 1.38 01/23/2020   FREET4 1.91 (H) 04/22/2019   FREET4 1.69 10/15/2018   FREET4 1.43 04/16/2018   FREET4 1.68 01/02/2018   FREET4 1.52 09/04/2017   FREET4 1.19 11/14/2014      Assessment &  Plan:   1) Type 2 diabetes mellitus with stage 3 chronic kidney disease, without long-term current use of insulin ([place])  - Diane Morgan has currently controlled type 2 DM since 50 years of age.  She presents today with her meter, no logs, showing stable fasting glycemic profile near target.  Her POCT A1c today is 7.9%, increasing from previous visit of 7.6%.  I suspect she is having postprandial hyperglycemia.  She does engage in routine physical activity and eats generally healthy.  She denies any s/s of hypoglycemia.  -her diabetes is complicated by obesity/sedentary life, CKD, smoking, and Diane Morgan remains at a high risk for more acute and chronic complications which include CAD, CVA, CKD, retinopathy, and neuropathy. These are all discussed in detail with the patient.  - Nutritional counseling repeated at each appointment due to patients tendency to fall back in to old habits.  - The patient admits there is a room for improvement in their diet and drink choices. -  Suggestion is made for the patient to avoid simple carbohydrates from their diet including Cakes, Sweet Desserts / Pastries, Ice Cream, Soda (diet and regular), Sweet Tea, Candies, Chips, Cookies, Sweet Pastries, Store Bought Juices, Alcohol in Excess of 1-2 drinks a day, Artificial Sweeteners, Coffee Creamer, and  "Sugar-free" Products. This will help patient to have stable blood glucose profile and potentially avoid unintended weight gain.   - I encouraged the patient to switch to unprocessed or minimally processed complex starch and increased protein intake (animal or plant source), fruits, and vegetables.   - Patient is advised to stick to a routine mealtimes to eat 3 meals a day and avoid unnecessary snacks (to snack only to correct hypoglycemia).  - I have approached her with the following individualized plan to manage diabetes and patient agrees:   -Despite her recent loss of control, she would not need insulin treatment for now.    -She is advised to continue Trulicity 1.5 mg SQ weekly and work on diet and exercise.  I discussed and re-initiated Glipizide 5 mg XL daily with breakfast.    -She is advised to continue monitoring blood glucose at least once daily, before breakfast and to call the clinic if she has readings less than 70 or above 200 for 3 tests in a row.  - Patient specific target  A1c;  LDL, HDL, Triglycerides  were discussed in detail.  2) BP/HTN:  -Her blood pressure is controlled to target without the use of antihypertensive medications.  3) Lipids/HPL:    Her most recent lipid panel from 07/30/20 shows controlled LDL of 74 and elevated triglycerides of 185.  She is advised to continue Atorvastatin 40 mg po daily at bedtime.  Side effects and precautions discussed with her.  She is advised to avoid fried foods and butter.  4)  Weight/Diet:  Her Body mass index is 37.05 kg/m.-she is a candidate for modest weight loss.  CDE Consult has been  initiated , exercise, and detailed carbohydrates information provided.  5) Hypothyroidism: -Diagnosed at approximate age of 67 years. She did not have TFTs drawn prior to today's visit.  She is advised to continue Levothyroxine 75 mcg po daily before breakfast.  S   - We discussed about the correct intake of her thyroid hormone, on empty  stomach at fasting, with water, separated by at least 30 minutes from breakfast and other medications,  and separated by more than 4 hours from calcium, iron, multivitamins, acid reflux medications (PPIs). -  Patient is made aware of the fact that thyroid hormone replacement is needed for life, dose to be adjusted by periodic monitoring of thyroid function tests.  6) Chronic Care/Health Maintenance: -she is on statin medications and is encouraged to continue to follow up with Ophthalmology, Dentist,  Podiatrist at least yearly or according to recommendations, and advised to away from smoking.    I have recommended yearly flu vaccine and pneumonia vaccination at least every 5 years; moderate intensity exercise for up to 150 minutes weekly; and  sleep for at least 7 hours a day.   - I advised patient to maintain close follow up with Dettinger, Fransisca Kaufmann, MD for primary care needs.      I spent 40 minutes in the care of the patient today including review of labs from [place], Lipids, Thyroid Function, Hematology (current and previous including abstractions from other facilities); face-to-face time discussing  her blood glucose readings/logs, discussing hypoglycemia and hyperglycemia episodes and symptoms, medications doses, her options of short and long term treatment based on the latest standards of care / guidelines;  discussion about incorporating lifestyle medicine;  and documenting the encounter.    Please refer to Patient Instructions for Blood Glucose Monitoring and Insulin/Medications Dosing Guide"  in media tab for additional information. Please  also refer to " Patient Self Inventory" in the Media  tab for reviewed elements of pertinent patient history.  Wendi Maya Joshua participated in the discussions, expressed understanding, and voiced agreement with the above plans.  All questions were answered to her satisfaction. she is encouraged to contact clinic should she have any questions or concerns  prior to her return visit.   Follow up plan: - No follow-ups on file.  Rayetta Pigg, [facility] [address] [place], [place] [address] Phone: [phone] Fax: [phone]  10/09/2020, 9:34 AM

## 2020-10-16 ENCOUNTER — Other Ambulatory Visit: Payer: Self-pay | Admitting: Family Medicine

## 2020-10-16 DIAGNOSIS — E1142 Type 2 diabetes mellitus with diabetic polyneuropathy: Secondary | ICD-10-CM

## 2020-10-16 DIAGNOSIS — E785 Hyperlipidemia, unspecified: Secondary | ICD-10-CM

## 2020-10-16 DIAGNOSIS — I10 Essential (primary) hypertension: Secondary | ICD-10-CM

## 2020-10-22 ENCOUNTER — Encounter: Payer: Self-pay | Admitting: Family Medicine

## 2020-10-22 ENCOUNTER — Ambulatory Visit (INDEPENDENT_AMBULATORY_CARE_PROVIDER_SITE_OTHER): Payer: Medicaid Other | Admitting: Family Medicine

## 2020-10-22 VITALS — BP 140/89 | HR 87 | Temp 97.6°F

## 2020-10-22 DIAGNOSIS — U071 COVID-19: Secondary | ICD-10-CM | POA: Diagnosis not present

## 2020-10-22 DIAGNOSIS — Z20822 Contact with and (suspected) exposure to covid-19: Secondary | ICD-10-CM

## 2020-10-22 MED ORDER — DEXAMETHASONE 6 MG PO TABS: 6.0000 mg | ORAL_TABLET | Freq: Two times a day (BID) | ORAL | 0 refills | Status: DC

## 2020-10-22 MED ORDER — AZITHROMYCIN 250 MG PO TABS: ORAL_TABLET | ORAL | 0 refills | Status: DC

## 2020-10-22 NOTE — Progress Notes (Signed)
No chief complaint on file.   HPI  Patient presents today for headache onset today. Ache at the anterior frontal area. Gone at time of evaluation. She has had a loss of sensation of taste All of herfood yesterday and 4 days ago tasted like a vitamin pill.   PMH: Smoking status noted ROS: Review of Systems  Constitutional:  Positive for appetite change. Negative for chills, diaphoresis, fatigue and fever.  HENT:  Positive for sneezing. Negative for congestion, ear pain, hearing loss, postnasal drip, rhinorrhea, sore throat and trouble swallowing.   Respiratory:  Negative for cough, chest tightness and shortness of breath.   Cardiovascular:  Negative for chest pain.  Skin:  Negative for rash.    Objective: BP 140/89   Pulse 87   Temp 97.6 F (36.4 C)   SpO2 97%  Gen: NAD, alert, cooperative with exam HEENT: NCAT, EOMI, PERRL CV: RRR, good S1/S2, no murmur Resp: CTABL, no wheezes, non-labored Abd: SNTND, BS present, no guarding or organomegaly Ext: No edema, warm Neuro: Alert and oriented, No gross deficits  Assessment and plan:  1. Exposure to COVID-19 virus   2. COVID-19 virus infection     No orders of the defined types were placed in this encounter.   Orders Placed This Encounter  Procedures   Novel Coronavirus, NAA ([facility])    Order Specific Question:   Is this test for diagnosis or screening    Answer:   Diagnosis of ill patient    Order Specific Question:   Symptomatic for COVID-19 as defined by CDC    Answer:   Yes    Order Specific Question:   Date of Symptom Onset    Answer:   10/21/2020    Order Specific Question:   Hospitalized for COVID-19    Answer:   No    Order Specific Question:   Admitted to ICU for COVID-19    Answer:   No    Order Specific Question:   Previously tested for COVID-19    Answer:   No    Order Specific Question:   Resident in a congregate (group) care setting    Answer:   No    Order Specific Question:   Is the patient student?     Answer:   No    Order Specific Question:   Employed in healthcare setting    Answer:   No    Order Specific Question:   Pregnant    Answer:   No    Order Specific Question:   Has patient completed COVID vaccination(s) (2 doses of Pfizer/Moderna 1 dose of [employer])    Answer:   Unknown    Follow up as needed.  Mechele Claude, MD

## 2020-10-23 LAB — SARS-COV-2, NAA 2 DAY TAT

## 2020-10-23 LAB — NOVEL CORONAVIRUS, NAA: SARS-CoV-2, NAA: DETECTED — AB

## 2020-12-02 ENCOUNTER — Other Ambulatory Visit: Payer: Self-pay | Admitting: "Endocrinology

## 2020-12-07 ENCOUNTER — Other Ambulatory Visit: Payer: Self-pay | Admitting: Nurse Practitioner

## 2020-12-07 ENCOUNTER — Other Ambulatory Visit: Payer: Self-pay | Admitting: "Endocrinology

## 2020-12-07 MED ORDER — GLIPIZIDE ER 5 MG PO TB24: 5.0000 mg | ORAL_TABLET | Freq: Every day | ORAL | 3 refills | Status: DC

## 2020-12-07 NOTE — Telephone Encounter (Signed)
In last OV from 10/09/20 I found this note:   I discussed and re-initiated Glipizide 5 mg XL daily with breakfast.    I do not see this medication listed on current medication list.  Please advise.

## 2020-12-16 ENCOUNTER — Other Ambulatory Visit: Payer: Self-pay | Admitting: "Endocrinology

## 2021-01-04 ENCOUNTER — Other Ambulatory Visit: Payer: Self-pay

## 2021-01-04 ENCOUNTER — Other Ambulatory Visit: Payer: Medicaid Other

## 2021-01-04 DIAGNOSIS — E559 Vitamin D deficiency, unspecified: Secondary | ICD-10-CM | POA: Diagnosis not present

## 2021-01-04 DIAGNOSIS — E039 Hypothyroidism, unspecified: Secondary | ICD-10-CM | POA: Diagnosis not present

## 2021-01-05 LAB — VITAMIN D 25 HYDROXY (VIT D DEFICIENCY, FRACTURES): Vit D, 25-Hydroxy: 25.9 ng/mL — ABNORMAL LOW (ref 30.0–100.0)

## 2021-01-05 LAB — T4, FREE: Free T4: 1.57 ng/dL (ref 0.82–1.77)

## 2021-01-05 LAB — TSH: TSH: 1.01 u[IU]/mL (ref 0.450–4.500)

## 2021-01-14 NOTE — Patient Instructions (Signed)

## 2021-01-15 ENCOUNTER — Ambulatory Visit (INDEPENDENT_AMBULATORY_CARE_PROVIDER_SITE_OTHER): Payer: Medicaid Other | Admitting: Nurse Practitioner

## 2021-01-15 ENCOUNTER — Other Ambulatory Visit: Payer: Self-pay | Admitting: Family Medicine

## 2021-01-15 ENCOUNTER — Other Ambulatory Visit: Payer: Self-pay

## 2021-01-15 ENCOUNTER — Encounter: Payer: Self-pay | Admitting: Nurse Practitioner

## 2021-01-15 VITALS — BP 118/78 | HR 82 | Ht 61.0 in | Wt 199.4 lb

## 2021-01-15 DIAGNOSIS — E785 Hyperlipidemia, unspecified: Secondary | ICD-10-CM

## 2021-01-15 DIAGNOSIS — E1142 Type 2 diabetes mellitus with diabetic polyneuropathy: Secondary | ICD-10-CM

## 2021-01-15 DIAGNOSIS — I10 Essential (primary) hypertension: Secondary | ICD-10-CM

## 2021-01-15 DIAGNOSIS — E1165 Type 2 diabetes mellitus with hyperglycemia: Secondary | ICD-10-CM

## 2021-01-15 DIAGNOSIS — E559 Vitamin D deficiency, unspecified: Secondary | ICD-10-CM

## 2021-01-15 DIAGNOSIS — E039 Hypothyroidism, unspecified: Secondary | ICD-10-CM

## 2021-01-15 LAB — POCT GLYCOSYLATED HEMOGLOBIN (HGB A1C): HbA1c, POC (controlled diabetic range): 7.8 % — AB (ref 0.0–7.0)

## 2021-01-15 NOTE — Progress Notes (Signed)
01/15/2021, 10:02 AM        Endocrinology follow-up note    Subjective:    Patient ID: Diane Morgan, female    DOB: [birth year].  Diane Morgan is being iseen in follow-up for management of currently controlled symptomatic type 2 diabetes, hypothyroidism, hyperlipidemia, hypertension. PMD:  Dettinger, Fransisca Kaufmann, MD.    Past Medical History:  Diagnosis Date   Anemia    Arthritis    Cataract    right   Cellulitis and abscess of trunk 11/28/2012   Diabetic peripheral neuropathy (HCC)    DM (diabetes mellitus) ([place])    Eczema    GERD (gastroesophageal reflux disease)    Hyperlipidemia    Hypertension    Hypothyroidism    Leg pain, bilateral 10/08/09   NIDDM (non-insulin dependent diabetes mellitus)    URI (upper respiratory infection) 07/01/10   Past Surgical History:  Procedure Laterality Date   CATARACT EXTRACTION W/PHACO Right 06/12/2017   Procedure: CATARACT EXTRACTION WITH PHACOEMULSIFICATION  AND INTRAOCULAR LENS PLACEMENT RIGHT EYE;  Surgeon: Tonny Branch, MD;  Location: [facility];  Service: Ophthalmology;  Laterality: Right;  CDE: 1.16   CHOLECYSTECTOMY     EYE SURGERY     right catracts   EYE SURGERY  12/2019   left cataract   LASER ABLATION CONDOLAMATA N/A 03/08/2017   Procedure: LASER ABLATION OF THE CERVIX;  Surgeon: Florian Buff, MD;  Location: [facility];  Service: Gynecology;  Laterality: N/A;   WISDOM TOOTH EXTRACTION     Social History   Socioeconomic History   Marital status: Single    Spouse name: Not on file   Number of children: Not on file   Years of education: Not on file   Highest education level: Not on file  Occupational History   Not on file  Tobacco Use   Smoking status: Every Day    Years: 1.00    Types: Cigarettes   Smokeless tobacco: Never   Tobacco comments:    smokes 4 cig daily  Vaping Use   Vaping Use: Never used  Substance and Sexual Activity   Alcohol use: No   Drug use: No   Sexual activity: Not  Currently    Birth control/protection: None  Other Topics Concern   Not on file  Social History Narrative   Not on file   Social Determinants of Health   Financial Resource Strain: Not on file  Food Insecurity: Not on file  Transportation Needs: Not on file  Physical Activity: Not on file  Stress: Not on file  Social Connections: Not on file   Outpatient Encounter Medications as of 01/15/2021  Medication Sig   aspirin 81 MG chewable tablet Chew 81 mg by mouth daily.   atorvastatin (LIPITOR) 40 MG tablet Take 1 tablet by mouth once daily   azithromycin (ZITHROMAX Z-PAK) 250 MG tablet Take two right away Then one a day for the next 4 days.   Blood Glucose Monitoring Suppl (ACCU-CHEK AVIVA PLUS) w/Device KIT USE AS DIRECTED   cetirizine (ZYRTEC) 10 MG tablet Take 10 mg by mouth at bedtime.    cycloSPORINE (RESTASIS) 0.05 % ophthalmic emulsion 1 drop 2 (two) times daily.   dexamethasone (DECADRON) 6 MG tablet Take 1 tablet (6 mg total) by mouth 2 (two) times daily with a meal.   fenofibrate (TRICOR) 145 MG tablet Take 1 tablet (145 mg total) by mouth daily.   gabapentin (NEURONTIN) 300 MG capsule Take  1 capsule (300 mg total) by mouth at bedtime.   glipiZIDE (GLUCOTROL XL) 5 MG 24 hr tablet Take 1 tablet (5 mg total) by mouth daily with breakfast.   glucose blood (ACCU-CHEK GUIDE) test strip USE 1 STRIP TO CHECK GLUCOSE THREE TIMES DAILY AS DIRECTED   ibuprofen (ADVIL,MOTRIN) 200 MG tablet Take 400 mg by mouth every 6 (six) hours as needed for headache or moderate pain.   levothyroxine (EUTHYROX) 50 MCG tablet Take 1.5 tablets (75 mcg total) by mouth daily.   TRULICITY 1.5 SE/8.3TD SOPN INJECT 1 DOSE SUBCUTANEOUSLY ONCE A WEEK   Vitamin D, Ergocalciferol, (DRISDOL) 1.25 MG (50000 UNIT) CAPS capsule Take 1 capsule (50,000 Units total) by mouth every 7 (seven) days.   No facility-administered encounter medications on file as of 01/15/2021.    ALLERGIES: Allergies  Allergen Reactions    Penicillins Anaphylaxis, Hives and Other (See Comments)    Childhood Reaction. Has patient had a PCN reaction causing immediate rash, facial/tongue/throat swelling, SOB or lightheadedness with hypotension: Yes Has patient had a PCN reaction causing severe rash involving mucus membranes or skin necrosis: No Has patient had a PCN reaction that required hospitalization: No Has patient had a PCN reaction occurring within the last 10 years: No If all of the above answers are "NO", then may proceed with Cephalosporin use.    Niaspan [Niacin] Other (See Comments)    Patient passes out.    VACCINATION STATUS: Immunization History  Administered Date(s) Administered   Influenza,inj,Quad PF,6+ Mos 11/28/2012, 01/22/2014, 12/26/2014, 12/07/2015, 12/15/2016, 01/02/2018, 01/07/2019, 01/23/2020   PFIZER(Purple Top)SARS-COV-2 Vaccination 06/29/2019, 07/20/2019, 01/23/2020   Pneumococcal Polysaccharide-23 01/07/2019   Tdap 03/19/2013   Diabetes She presents for her follow-up diabetic visit. She has type 2 diabetes mellitus. Onset time: Diagnosed at approx age of 50. Her disease course has been improving. Pertinent negatives for hypoglycemia include no nervousness/anxiousness or tremors. There are no diabetic associated symptoms. Pertinent negatives for diabetes include no fatigue and no weight loss. There are no hypoglycemic complications. Symptoms are stable. Diabetic complications include nephropathy. Risk factors for coronary artery disease include diabetes mellitus, dyslipidemia, hypertension, obesity, sedentary lifestyle and tobacco exposure. Current diabetic treatment includes oral agent (monotherapy) (Trulicity 1.5 SQ weekly). She is compliant with treatment all of the time. Her weight is fluctuating minimally. She is following a generally healthy diet. When asked about meal planning, she reported none. She has not had a previous visit with a dietitian. She rarely participates in exercise. Her home  blood glucose trend is decreasing steadily. Her overall blood glucose range is 140-180 mg/dl. (She presents today, accompanied by her family member, with her meter and logs showing stable, slightly above target fasting glycemic profile.  Her POCT A1c today is 7.8%, essentially unchanged from previous visit of 7.7%.  She did have COVID since last visit and her glucose readings were a bit higher during that time.  Analysis of her meter shows 7-day average of 152, 14-day average of 156, 30-day average of 165, and 90-day average of 173.  She denies any hypoglycemia.) An ACE inhibitor/angiotensin II receptor blocker is not being taken. She does not see a podiatrist.Eye exam is current.  Thyroid Problem Presents for follow-up visit. Symptoms include heat intolerance and weight gain. Patient reports no anxiety, cold intolerance, constipation, depressed mood, diarrhea, fatigue, leg swelling, palpitations, tremors or weight loss. The symptoms have been stable. Her past medical history is significant for diabetes and hyperlipidemia.  Hyperlipidemia This is a chronic problem. The current episode started  more than 1 year ago. The problem is uncontrolled. Recent lipid tests were reviewed and are variable. Exacerbating diseases include chronic renal disease, diabetes, hypothyroidism and obesity. Factors aggravating her hyperlipidemia include fatty foods. Current antihyperlipidemic treatment includes statins and fibric acid derivatives. The current treatment provides moderate improvement of lipids. There are no compliance problems.  Risk factors for coronary artery disease include diabetes mellitus, dyslipidemia, hypertension, obesity and a sedentary lifestyle.    Review of systems  Constitutional: + Minimally fluctuating body weight,  current Body mass index is 37.68 kg/m. , no fatigue, no subjective hyperthermia, no subjective hypothermia Eyes: no blurry vision, no xerophthalmia ENT: no sore throat, no nodules  palpated in throat, no dysphagia/odynophagia, no hoarseness Cardiovascular: no chest pain, no shortness of breath, no palpitations, no leg swelling Respiratory: no cough, no shortness of breath Gastrointestinal: no nausea/vomiting/diarrhea Musculoskeletal: no muscle/joint aches Skin: no rashes, no hyperemia Neurological: no tremors, no numbness, no tingling, no dizziness Psychiatric: no depression, no anxiety   Objective:    BP 118/78   Pulse 82   Ht _0  (1.549 m)   Wt 199 lb 6.4 oz (90.4 kg)   BMI 37.68 kg/m   Wt Readings from Last 3 Encounters:  01/15/21 199 lb 6.4 oz (90.4 kg)  10/09/20 196 lb 1.6 oz (89 kg)  07/30/20 197 lb (89.4 kg)    BP Readings from Last 3 Encounters:  01/15/21 118/78  10/22/20 140/89  10/09/20 125/84     Physical Exam- Limited  Constitutional:  Body mass index is 37.68 kg/m. , not in acute distress, normal state of mind Eyes:  EOMI, no exophthalmos Neck: Supple Cardiovascular: RRR, no murmurs, rubs, or gallops, no edema Respiratory: Adequate breathing efforts, no crackles, rales, rhonchi, or wheezing Musculoskeletal: no gross deformities, strength intact in all four extremities, no gross restriction of joint movements Skin:  no rashes, no hyperemia Neurological: no tremor with outstretched hands      CMP     Component Value Date/Time   NA 142 09/28/2020 0843   K 5.1 09/28/2020 0843   CL 104 09/28/2020 0843   CO2 20 09/28/2020 0843   GLUCOSE 186 (H) 09/28/2020 0843   GLUCOSE 438 (H) 06/06/2017 0811   BUN 21 09/28/2020 0843   CREATININE 1.49 (H) 09/28/2020 0843   CREATININE 1.13 (H) 08/17/2012 1031   CALCIUM 9.7 09/28/2020 0843   PROT 6.0 09/28/2020 0843   ALBUMIN 3.8 09/28/2020 0843   AST 21 09/28/2020 0843   ALT 18 09/28/2020 0843   ALKPHOS 83 09/28/2020 0843   BILITOT 0.3 09/28/2020 0843   GFRNONAA 50 (L) 01/23/2020 1126   GFRNONAA 61 08/17/2012 1031   GFRAA 58 (L) 01/23/2020 1126   GFRAA 70 08/17/2012 1031      Diabetic Labs (most recent): Lab Results  Component Value Date   HGBA1C 7.8 (A) 01/15/2021   HGBA1C 7.9 (A) 10/09/2020   HGBA1C 7.6 (A) 07/10/2020     Lipid Panel ( most recent) Lipid Panel     Component Value Date/Time   CHOL 103 07/30/2020 1457   CHOL 85 08/17/2012 1031   TRIG 187 (H) 07/30/2020 1457   TRIG 161 (H) 08/17/2012 1031   HDL 33 (L) 07/30/2020 1457   HDL 26 (L) 08/17/2012 1031   CHOLHDL 3.1 07/30/2020 1457   CHOLHDL 10.3 11/29/2012 1030   VLDL 39 11/29/2012 1030   LDLCALC 39 07/30/2020 1457   LDLCALC 27 08/17/2012 1031     Lab Results  Component Value Date  TSH 1.010 01/04/2021   TSH 0.671 06/29/2020   TSH 1.080 01/23/2020   TSH 1.320 07/26/2019   TSH 0.619 04/22/2019   TSH 0.477 01/25/2019   TSH 0.854 10/15/2018   TSH 1.130 04/16/2018   TSH 0.346 (L) 01/02/2018   TSH 0.637 09/04/2017   FREET4 1.57 01/04/2021   FREET4 2.22 (H) 06/29/2020   FREET4 1.38 01/23/2020   FREET4 1.91 (H) 04/22/2019   FREET4 1.69 10/15/2018   FREET4 1.43 04/16/2018   FREET4 1.68 01/02/2018   FREET4 1.52 09/04/2017   FREET4 1.19 11/14/2014      Assessment & Plan:   1) Type 2 diabetes mellitus with stage 3 chronic kidney disease, without long-term current use of insulin ([place])  - Diane Morgan has currently controlled type 2 DM since 50 years of age.  She presents today, accompanied by her family member, with her meter and logs showing stable, slightly above target fasting glycemic profile.  Her POCT A1c today is 7.8%, essentially unchanged from previous visit of 7.7%.  She did have COVID since last visit and her glucose readings were a bit higher during that time.  Analysis of her meter shows 7-day average of 152, 14-day average of 156, 30-day average of 165, and 90-day average of 173.  She denies any hypoglycemia.  -her diabetes is complicated by obesity/sedentary life, CKD, smoking, and Diane Morgan remains at a high risk for more acute and chronic  complications which include CAD, CVA, CKD, retinopathy, and neuropathy. These are all discussed in detail with the patient.  - Nutritional counseling repeated at each appointment due to patients tendency to fall back in to old habits.  - The patient admits there is a room for improvement in their diet and drink choices. -  Suggestion is made for the patient to avoid simple carbohydrates from their diet including Cakes, Sweet Desserts / Pastries, Ice Cream, Soda (diet and regular), Sweet Tea, Candies, Chips, Cookies, Sweet Pastries, Store Bought Juices, Alcohol in Excess of 1-2 drinks a day, Artificial Sweeteners, Coffee Creamer, and "Sugar-free" Products. This will help patient to have stable blood glucose profile and potentially avoid unintended weight gain.   - I encouraged the patient to switch to unprocessed or minimally processed complex starch and increased protein intake (animal or plant source), fruits, and vegetables.   - Patient is advised to stick to a routine mealtimes to eat 3 meals a day and avoid unnecessary snacks (to snack only to correct hypoglycemia).  - I have approached her with the following individualized plan to manage diabetes and patient agrees:   -Based on her improving glycemic profile, she is advised to continue Trulicity 1.5 mg SQ weekly and Glipizide 5 mg XL daily with breakfast.    -She is advised to continue monitoring blood glucose at least once daily, before breakfast and to call the clinic if she has readings less than 70 or above 200 for 3 tests in a row.  - Patient specific target  A1c;  LDL, HDL, Triglycerides  were discussed in detail.  2) BP/HTN:  -Her blood pressure is controlled to target without the use of antihypertensive medications.  3) Lipids/HPL:    Her most recent lipid panel from 07/30/20 shows controlled LDL of 74 and elevated triglycerides of 185.  She is advised to continue Atorvastatin 40 mg po daily at bedtime.  Side effects and  precautions discussed with her.  She is advised to avoid fried foods and butter.  4)  Weight/Diet:  Her  Body mass index is 37.68 kg/m.-she is a candidate for modest weight loss.  CDE Consult has been  initiated , exercise, and detailed carbohydrates information provided.  5) Hypothyroidism: -Diagnosed at approximate age of 48 years.   Her previsit thyroid function tests are consistent with appropriate hormone replacement.  She is advised to continue Levothyroxine 75 mcg po daily before breakfast.    - We discussed about the correct intake of her thyroid hormone, on empty stomach at fasting, with water, separated by at least 30 minutes from breakfast and other medications,  and separated by more than 4 hours from calcium, iron, multivitamins, acid reflux medications (PPIs). -Patient is made aware of the fact that thyroid hormone replacement is needed for life, dose to be adjusted by periodic monitoring of thyroid function tests.  6) Chronic Care/Health Maintenance: -she is on statin medications and is encouraged to continue to follow up with Ophthalmology, Dentist, Podiatrist at least yearly or according to recommendations, and advised to away from smoking.    I have recommended yearly flu vaccine and pneumonia vaccination at least every 5 years; moderate intensity exercise for up to 150 minutes weekly; and  sleep for at least 7 hours a day.   - I advised patient to maintain close follow up with Dettinger, Fransisca Kaufmann, MD for primary care needs.     I spent 30 minutes in the care of the patient today including review of labs from [place], Lipids, Thyroid Function, Hematology (current and previous including abstractions from other facilities); face-to-face time discussing  her blood glucose readings/logs, discussing hypoglycemia and hyperglycemia episodes and symptoms, medications doses, her options of short and long term treatment based on the latest standards of care / guidelines;  discussion about  incorporating lifestyle medicine;  and documenting the encounter.    Please refer to Patient Instructions for Blood Glucose Monitoring and Insulin/Medications Dosing Guide"  in media tab for additional information. Please  also refer to " Patient Self Inventory" in the Media  tab for reviewed elements of pertinent patient history.  Diane Morgan participated in the discussions, expressed understanding, and voiced agreement with the above plans.  All questions were answered to her satisfaction. she is encouraged to contact clinic should she have any questions or concerns prior to her return visit.   Follow up plan: - Return in about 4 months (around 05/18/2021) for Diabetes F/U with A1c in office, No previsit labs, Thyroid follow up, Bring meter and logs.  Rayetta Pigg, [facility] [address] [place], [place] [address] Phone: [phone] Fax: [phone]  01/15/2021, 10:02 AM

## 2021-01-21 ENCOUNTER — Other Ambulatory Visit: Payer: Self-pay | Admitting: Family Medicine

## 2021-01-29 ENCOUNTER — Encounter: Payer: Self-pay | Admitting: Family Medicine

## 2021-01-29 ENCOUNTER — Other Ambulatory Visit: Payer: Self-pay

## 2021-01-29 ENCOUNTER — Ambulatory Visit: Payer: Medicaid Other | Admitting: Family Medicine

## 2021-01-29 VITALS — BP 125/83 | HR 71 | Ht 61.0 in | Wt 198.0 lb

## 2021-01-29 DIAGNOSIS — Z23 Encounter for immunization: Secondary | ICD-10-CM | POA: Diagnosis not present

## 2021-01-29 DIAGNOSIS — I10 Essential (primary) hypertension: Secondary | ICD-10-CM | POA: Diagnosis not present

## 2021-01-29 DIAGNOSIS — E1165 Type 2 diabetes mellitus with hyperglycemia: Secondary | ICD-10-CM | POA: Diagnosis not present

## 2021-01-29 DIAGNOSIS — Z1211 Encounter for screening for malignant neoplasm of colon: Secondary | ICD-10-CM | POA: Diagnosis not present

## 2021-01-29 DIAGNOSIS — J4521 Mild intermittent asthma with (acute) exacerbation: Secondary | ICD-10-CM | POA: Diagnosis not present

## 2021-01-29 DIAGNOSIS — E782 Mixed hyperlipidemia: Secondary | ICD-10-CM | POA: Diagnosis not present

## 2021-01-29 LAB — BAYER DCA HB A1C WAIVED: HB A1C (BAYER DCA - WAIVED): 7.1 % — ABNORMAL HIGH (ref 4.8–5.6)

## 2021-01-29 LAB — CMP14+EGFR
ALT: 16 IU/L (ref 0–32)
AST: 21 IU/L (ref 0–40)
Albumin/Globulin Ratio: 1.8 (ref 1.2–2.2)
Albumin: 3.8 g/dL (ref 3.8–4.8)
Alkaline Phosphatase: 114 IU/L (ref 44–121)
BUN/Creatinine Ratio: 7 — ABNORMAL LOW (ref 9–23)
BUN: 9 mg/dL (ref 6–24)
Bilirubin Total: 0.4 mg/dL (ref 0.0–1.2)
CO2: 24 mmol/L (ref 20–29)
Calcium: 8.9 mg/dL (ref 8.7–10.2)
Chloride: 108 mmol/L — ABNORMAL HIGH (ref 96–106)
Creatinine, Ser: 1.26 mg/dL — ABNORMAL HIGH (ref 0.57–1.00)
Globulin, Total: 2.1 g/dL (ref 1.5–4.5)
Glucose: 142 mg/dL — ABNORMAL HIGH (ref 70–99)
Potassium: 5.2 mmol/L (ref 3.5–5.2)
Sodium: 144 mmol/L (ref 134–144)
Total Protein: 5.9 g/dL — ABNORMAL LOW (ref 6.0–8.5)
eGFR: 53 mL/min/{1.73_m2} — ABNORMAL LOW (ref >59)

## 2021-01-29 LAB — CBC WITH DIFFERENTIAL/PLATELET
Basophils Absolute: 0.1 10*3/uL (ref 0.0–0.2)
Basos: 1 %
EOS (ABSOLUTE): 0.5 10*3/uL — ABNORMAL HIGH (ref 0.0–0.4)
Eos: 6 %
Hematocrit: 45.1 % (ref 34.0–46.6)
Hemoglobin: 14.8 g/dL (ref 11.1–15.9)
Immature Grans (Abs): 0 10*3/uL (ref 0.0–0.1)
Immature Granulocytes: 0 %
Lymphocytes Absolute: 2.4 10*3/uL (ref 0.7–3.1)
Lymphs: 30 %
MCH: 28.7 pg (ref 26.6–33.0)
MCHC: 32.8 g/dL (ref 31.5–35.7)
MCV: 88 fL (ref 79–97)
Monocytes Absolute: 0.6 10*3/uL (ref 0.1–0.9)
Monocytes: 7 %
Neutrophils Absolute: 4.4 10*3/uL (ref 1.4–7.0)
Neutrophils: 56 %
Platelets: 270 10*3/uL (ref 150–450)
RBC: 5.15 x10E6/uL (ref 3.77–5.28)
RDW: 13.2 % (ref 11.7–15.4)
WBC: 7.9 10*3/uL (ref 3.4–10.8)

## 2021-01-29 LAB — LIPID PANEL
Chol/HDL Ratio: 3.5 ratio (ref 0.0–4.4)
Cholesterol, Total: 101 mg/dL (ref 100–199)
HDL: 29 mg/dL — ABNORMAL LOW (ref >39)
LDL Chol Calc (NIH): 39 mg/dL (ref 0–99)
Triglycerides: 205 mg/dL — ABNORMAL HIGH (ref 0–149)
VLDL Cholesterol Cal: 33 mg/dL (ref 5–40)

## 2021-01-29 NOTE — Progress Notes (Signed)
BP 125/83   Pulse 71   Ht 5' 1" (1.549 m)   Wt 198 lb (89.8 kg)   SpO2 98%   BMI 37.41 kg/m    Subjective:   Patient ID: Diane Morgan, female    DOB: 1970-09-15, 50 y.o.   MRN: [id number]  HPI: Diane Morgan is a 50 y.o. female presenting on 01/29/2021 for Medical Management of Chronic Issues, Diabetes, and Hypothyroidism   HPI Type 2 diabetes mellitus Patient comes in today for recheck of his diabetes. Patient has been currently taking glipizide and Trulicity. Patient is not currently on an ACE inhibitor/ARB. Patient has seen an ophthalmologist this year. Patient denies any new issues with their feet. The symptom started onset as an adult peripheral neuropathy and CKD stage III and hyperlipidemia and hypertriglyceridemia ARE RELATED TO DM   Hypothyroidism recheck Patient is coming in for thyroid recheck today as well. They deny any issues with hair changes or heat or cold problems or diarrhea or constipation. They deny any chest pain or palpitations. They are currently on levothyroxine 50 micrograms   Hypertension Patient is currently on no medicine currently, is diet controlled, and their blood pressure today is 125/83. Patient denies any lightheadedness or dizziness. Patient denies headaches, blurred vision, chest pains, shortness of breath, or weakness. Denies any side effects from medication and is content with current medication.   Hyperlipidemia and hypertriglyceridemia Patient is coming in for recheck of his hyperlipidemia. The patient is currently taking atorvastatin and fenofibrate. They deny any issues with myalgias or history of liver damage from it. They deny any focal numbness or weakness or chest pain.   Patient is a smoker and over the past 2 to 3 weeks she has been having increased cough and wheezing that is productive sometimes.  Relevant past medical, surgical, family and social history reviewed and updated as indicated. Interim medical history since our last  visit reviewed. Allergies and medications reviewed and updated.  Review of Systems  Constitutional:  Negative for chills and fever.  Eyes:  Negative for visual disturbance.  Respiratory:  Positive for cough and wheezing. Negative for chest tightness and shortness of breath.   Cardiovascular:  Negative for chest pain and leg swelling.  Musculoskeletal:  Negative for back pain and gait problem.  Skin:  Negative for rash.  Neurological:  Negative for light-headedness and headaches.  Psychiatric/Behavioral:  Negative for agitation and behavioral problems.   All other systems reviewed and are negative.  Per HPI unless specifically indicated above   Allergies as of 01/29/2021       Reactions   Penicillins Anaphylaxis, Hives, Other (See Comments)   Childhood Reaction. Has patient had a PCN reaction causing immediate rash, facial/tongue/throat swelling, SOB or lightheadedness with hypotension: Yes Has patient had a PCN reaction causing severe rash involving mucus membranes or skin necrosis: No Has patient had a PCN reaction that required hospitalization: No Has patient had a PCN reaction occurring within the last 10 years: No If all of the above answers are "NO", then may proceed with Cephalosporin use.   Niaspan [niacin] Other (See Comments)   Patient passes out.        Medication List        Accurate as of January 29, 2021  8:37 AM. If you have any questions, ask your nurse or doctor.          Accu-Chek Aviva Plus w/Device Kit USE AS DIRECTED   Accu-Chek Guide test strip Generic drug: glucose  blood USE 1 STRIP TO CHECK GLUCOSE THREE TIMES DAILY AS DIRECTED   aspirin 81 MG chewable tablet Chew 81 mg by mouth daily.   atorvastatin 40 MG tablet Commonly known as: LIPITOR Take 1 tablet by mouth once daily   azithromycin 250 MG tablet Commonly known as: Zithromax Z-Pak Take two right away Then one a day for the next 4 days.   cetirizine 10 MG tablet Commonly known  as: ZYRTEC Take 10 mg by mouth at bedtime.   cycloSPORINE 0.05 % ophthalmic emulsion Commonly known as: RESTASIS 1 drop 2 (two) times daily.   dexamethasone 6 MG tablet Commonly known as: DECADRON Take 1 tablet (6 mg total) by mouth 2 (two) times daily with a meal.   fenofibrate 145 MG tablet Commonly known as: TRICOR Take 1 tablet (145 mg total) by mouth daily.   gabapentin 300 MG capsule Commonly known as: NEURONTIN Take 1 capsule by mouth at bedtime   glipiZIDE 5 MG 24 hr tablet Commonly known as: GLUCOTROL XL Take 1 tablet (5 mg total) by mouth daily with breakfast.   ibuprofen 200 MG tablet Commonly known as: ADVIL Take 400 mg by mouth every 6 (six) hours as needed for headache or moderate pain.   levothyroxine 50 MCG tablet Commonly known as: Euthyrox Take 1.5 tablets (75 mcg total) by mouth daily.   Trulicity 1.5 LZ/7.6BH Sopn Generic drug: Dulaglutide INJECT 1 DOSE SUBCUTANEOUSLY ONCE A WEEK   Vitamin D (Ergocalciferol) 1.25 MG (50000 UNIT) Caps capsule Commonly known as: DRISDOL Take 1 capsule (50,000 Units total) by mouth every 7 (seven) days.         Objective:   BP 125/83   Pulse 71   Ht _0  (1.549 m)   Wt 198 lb (89.8 kg)   SpO2 98%   BMI 37.41 kg/m   Wt Readings from Last 3 Encounters:  01/29/21 198 lb (89.8 kg)  01/15/21 199 lb 6.4 oz (90.4 kg)  10/09/20 196 lb 1.6 oz (89 kg)    Physical Exam Vitals and nursing note reviewed.  Constitutional:      General: She is not in acute distress.    Appearance: She is well-developed. She is not diaphoretic.  Eyes:     Conjunctiva/sclera: Conjunctivae normal.  Cardiovascular:     Rate and Rhythm: Normal rate and regular rhythm.     Heart sounds: Normal heart sounds. No murmur heard. Pulmonary:     Effort: Pulmonary effort is normal. No respiratory distress.     Breath sounds: Wheezing present. No rhonchi or rales.  Musculoskeletal:        General: No swelling or tenderness. Normal range of  motion.  Skin:    General: Skin is warm and dry.     Findings: No rash.  Neurological:     Mental Status: She is alert and oriented to person, place, and time.     Coordination: Coordination normal.  Psychiatric:        Behavior: Behavior normal.      Assessment & Plan:   Problem List Items Addressed This Visit       Cardiovascular and Mediastinum   Hypertension   Relevant Orders   CBC with Differential/Platelet   CMP14+EGFR   Lipid panel   Bayer DCA Hb A1c Waived     Endocrine   Uncontrolled type 2 diabetes mellitus with hyperglycemia ([place]) - Primary   Relevant Orders   CBC with Differential/Platelet   CMP14+EGFR   Lipid panel   Bayer  DCA Hb A1c Waived     Other   Hyperlipidemia   Relevant Orders   CBC with Differential/Platelet   CMP14+EGFR   Lipid panel   Bayer DCA Hb A1c Waived   Other Visit Diagnoses     Colon cancer screening       Relevant Orders   Ambulatory referral to Gastroenterology   Mild intermittent reactive airway disease with acute exacerbation           Concern for possible early COPD versus reactive airways, gave a sample for Breztri  Continue current medicine, will check blood work today. Follow up plan: Return in about 6 months (around 07/29/2021), or if symptoms worsen or fail to improve, for Hypertension cholesterol diabetes.  Counseling provided for all of the vaccine components Orders Placed This Encounter  Procedures   CBC with Differential/Platelet   CMP14+EGFR   Lipid panel   Bayer [place] Hb A1c Waived   Ambulatory referral to Gastroenterology    Caryl Pina, MD [facility] 01/29/2021, 8:37 AM

## 2021-02-01 NOTE — Progress Notes (Signed)
Pt mother calling about lab results has some questions about things she did not understand

## 2021-02-18 ENCOUNTER — Other Ambulatory Visit: Payer: Self-pay | Admitting: Family Medicine

## 2021-02-18 DIAGNOSIS — E1142 Type 2 diabetes mellitus with diabetic polyneuropathy: Secondary | ICD-10-CM

## 2021-02-18 DIAGNOSIS — E785 Hyperlipidemia, unspecified: Secondary | ICD-10-CM

## 2021-03-04 ENCOUNTER — Other Ambulatory Visit: Payer: Self-pay | Admitting: "Endocrinology

## 2021-03-06 ENCOUNTER — Other Ambulatory Visit: Payer: Self-pay | Admitting: Nurse Practitioner

## 2021-03-08 ENCOUNTER — Other Ambulatory Visit: Payer: Self-pay

## 2021-03-08 DIAGNOSIS — E1165 Type 2 diabetes mellitus with hyperglycemia: Secondary | ICD-10-CM

## 2021-03-08 MED ORDER — TRULICITY 1.5 MG/0.5ML ~~LOC~~ SOAJ: 1.5000 mg | SUBCUTANEOUS | 0 refills | Status: DC

## 2021-03-15 DIAGNOSIS — N61 Mastitis without abscess: Secondary | ICD-10-CM | POA: Diagnosis not present

## 2021-04-21 ENCOUNTER — Other Ambulatory Visit: Payer: Self-pay | Admitting: Family Medicine

## 2021-04-21 DIAGNOSIS — E785 Hyperlipidemia, unspecified: Secondary | ICD-10-CM

## 2021-04-21 DIAGNOSIS — I10 Essential (primary) hypertension: Secondary | ICD-10-CM

## 2021-04-21 DIAGNOSIS — E1142 Type 2 diabetes mellitus with diabetic polyneuropathy: Secondary | ICD-10-CM

## 2021-05-21 ENCOUNTER — Ambulatory Visit: Payer: Medicaid Other | Admitting: Nurse Practitioner

## 2021-05-28 ENCOUNTER — Encounter: Payer: Self-pay | Admitting: Nurse Practitioner

## 2021-05-28 ENCOUNTER — Ambulatory Visit (INDEPENDENT_AMBULATORY_CARE_PROVIDER_SITE_OTHER): Payer: Medicaid Other | Admitting: Nurse Practitioner

## 2021-05-28 ENCOUNTER — Other Ambulatory Visit: Payer: Self-pay

## 2021-05-28 VITALS — BP 122/82 | HR 85 | Ht 61.0 in | Wt 202.4 lb

## 2021-05-28 DIAGNOSIS — I10 Essential (primary) hypertension: Secondary | ICD-10-CM

## 2021-05-28 DIAGNOSIS — E559 Vitamin D deficiency, unspecified: Secondary | ICD-10-CM | POA: Diagnosis not present

## 2021-05-28 DIAGNOSIS — E785 Hyperlipidemia, unspecified: Secondary | ICD-10-CM | POA: Diagnosis not present

## 2021-05-28 DIAGNOSIS — E039 Hypothyroidism, unspecified: Secondary | ICD-10-CM

## 2021-05-28 DIAGNOSIS — E1165 Type 2 diabetes mellitus with hyperglycemia: Secondary | ICD-10-CM

## 2021-05-28 LAB — POCT GLYCOSYLATED HEMOGLOBIN (HGB A1C): HbA1c, POC (controlled diabetic range): 9.8 % — AB (ref 0.0–7.0)

## 2021-05-28 MED ORDER — GLIPIZIDE ER 5 MG PO TB24: 5.0000 mg | ORAL_TABLET | Freq: Every day | ORAL | 3 refills | Status: DC

## 2021-05-28 MED ORDER — TRULICITY 1.5 MG/0.5ML ~~LOC~~ SOAJ: 1.5000 mg | SUBCUTANEOUS | 0 refills | Status: DC

## 2021-05-28 MED ORDER — LEVOTHYROXINE SODIUM 75 MCG PO TABS: 75.0000 ug | ORAL_TABLET | Freq: Every day | ORAL | 1 refills | Status: DC

## 2021-05-28 MED ORDER — ACCU-CHEK GUIDE VI STRP: ORAL_STRIP | 3 refills | Status: DC

## 2021-05-28 NOTE — Patient Instructions (Signed)
Diabetes Mellitus Emergency Preparedness Plan ?A diabetes emergency preparedness plan is a checklist to make sure you have everything you need to manage your diabetes in case of an emergency, such as an evacuation, natural disaster, national security emergency, or pandemic lockdown. ?Managing your diabetes is something you have to do all day every day. The American Diabetes Association and the American College of Endocrinology both recommend putting together an emergency diabetes kit. Your kit should include important information and documents as well as all the supplies you will need to manage your diabetes for at least 1 week. Store it in a portable, waterproof bag or container. The best time to start making your emergency kit is now. ?How to make your emergency kit ?Collect information and documents ?Include the following information and documents in your kit: ?The type of diabetes you have. ?A copy of your health insurance cards and photo ID. ?A list of all your other medical conditions, allergies, and surgeries. ?A list of all your medicines and doses with the contact information for your pharmacy. Ask your health care provider for a list of your current medicines. ?Any recent lab results, including your latest hemoglobin A1C (HbA1C). ?The make, model, and serial number of your insulin pump, if you use one. Also include contact information for the manufacturer. ?Contact information for people who should be notified in case of an emergency. Include your health care provider's name, address, and phone number. ?Collect diabetes care items ?Include the following diabetes care items in your kit: ?At least a 1-week supply of: ?Oral medicines. ?Insulin. ?Blood glucose testing supplies. These include testing strips, lancets, and extra batteries for your blood glucose monitor and pump. ?A charger for the continuous glucose monitor (CGM) receiver and pump. ?Any extra supplies needed for your CGM or pump. ?A supply of  glucagon, glucose tablets, juice, soda, or hard candy in case of hypoglycemia. ?Coolers or cold packs. ?A safe container for syringes, needles, and lancets. ? ?Other preparations ?Other things to consider doing as part of your emergency plan: ?Make sure that your mobile phone is charged and that you have an extra charger, cable, or batteries. ?Choose a meeting place for family members. ?Wear a medical alert or ID bracelet. ?If you have a child with diabetes, make sure your child's school has a copy of his or her emergency plan, including the name of the staff member who will assist your child. ?Where to find more information ?American Diabetes Association: www.diabetes.org ?Centers for Disease Control and Prevention: blogs.cdc.gov ?Summary ?A diabetes emergency preparedness plan is a checklist to make sure you have everything you need in case of an emergency. ?Your kit should include important information and documents as well as all the supplies you will need to manage your condition for at least 1 week. ?Store your kit in a portable, waterproof bag or container. ?The best time to start making your emergency kit is now. ?This information is not intended to replace advice given to you by your health care provider. Make sure you discuss any questions you have with your health care provider. ?Document Revised: 09/12/2019 Document Reviewed: 09/12/2019 ?Elsevier Patient Education ? 2022 [employer] ? ?

## 2021-05-28 NOTE — Progress Notes (Signed)
05/28/2021, 9:26 AM        Endocrinology follow-up note    Subjective:    Patient ID: Diane Morgan, female    DOB: [birth year].  Diane Morgan is being iseen in follow-up for management of currently controlled symptomatic type 2 diabetes, hypothyroidism, hyperlipidemia, hypertension. PMD:  Dettinger, Fransisca Kaufmann, MD.    Past Medical History:  Diagnosis Date   Anemia    Arthritis    Cataract    right   Cellulitis and abscess of trunk 11/28/2012   Diabetic peripheral neuropathy (HCC)    DM (diabetes mellitus) ([place])    Eczema    GERD (gastroesophageal reflux disease)    Hyperlipidemia    Hypertension    Hypothyroidism    Leg pain, bilateral 10/08/09   NIDDM (non-insulin dependent diabetes mellitus)    URI (upper respiratory infection) 07/01/10   Past Surgical History:  Procedure Laterality Date   CATARACT EXTRACTION W/PHACO Right 06/12/2017   Procedure: CATARACT EXTRACTION WITH PHACOEMULSIFICATION  AND INTRAOCULAR LENS PLACEMENT RIGHT EYE;  Surgeon: Tonny Branch, MD;  Location: [facility];  Service: Ophthalmology;  Laterality: Right;  CDE: 1.16   CHOLECYSTECTOMY     EYE SURGERY     right catracts   EYE SURGERY  12/2019   left cataract   LASER ABLATION CONDOLAMATA N/A 03/08/2017   Procedure: LASER ABLATION OF THE CERVIX;  Surgeon: Florian Buff, MD;  Location: [facility];  Service: Gynecology;  Laterality: N/A;   WISDOM TOOTH EXTRACTION     Social History   Socioeconomic History   Marital status: Single    Spouse name: Not on file   Number of children: Not on file   Years of education: Not on file   Highest education level: Not on file  Occupational History   Not on file  Tobacco Use   Smoking status: Every Day    Years: 1.00    Types: Cigarettes   Smokeless tobacco: Never   Tobacco comments:    smokes 4 cig daily  Vaping Use   Vaping Use: Never used  Substance and Sexual Activity   Alcohol use: No   Drug use: No   Sexual activity: Not  Currently    Birth control/protection: None  Other Topics Concern   Not on file  Social History Narrative   Not on file   Social Determinants of Health   Financial Resource Strain: Not on file  Food Insecurity: Not on file  Transportation Needs: Not on file  Physical Activity: Not on file  Stress: Not on file  Social Connections: Not on file   Outpatient Encounter Medications as of 05/28/2021  Medication Sig   aspirin 81 MG chewable tablet Chew 81 mg by mouth daily.   atorvastatin (LIPITOR) 40 MG tablet Take 1 tablet by mouth once daily   Blood Glucose Monitoring Suppl (ACCU-CHEK AVIVA PLUS) w/Device KIT USE AS DIRECTED   cetirizine (ZYRTEC) 10 MG tablet Take 10 mg by mouth at bedtime.    cycloSPORINE (RESTASIS) 0.05 % ophthalmic emulsion 1 drop 2 (two) times daily.   dexamethasone (DECADRON) 6 MG tablet Take 1 tablet (6 mg total) by mouth 2 (two) times daily with a meal.   fenofibrate (TRICOR) 145 MG tablet Take 1 tablet by mouth once daily   gabapentin (NEURONTIN) 300 MG capsule Take 1 capsule by mouth at bedtime   ibuprofen (ADVIL,MOTRIN) 200 MG tablet Take 400 mg by mouth every 6 (six) hours as  needed for headache or moderate pain.   Vitamin D, Ergocalciferol, (DRISDOL) 1.25 MG (50000 UNIT) CAPS capsule Take 1 capsule (50,000 Units total) by mouth every 7 (seven) days.   [DISCONTINUED] Dulaglutide (TRULICITY) 1.5 GQ/6.7YP SOPN Inject 1.5 mg into the skin once a week.   [DISCONTINUED] glipiZIDE (GLUCOTROL XL) 5 MG 24 hr tablet Take 1 tablet (5 mg total) by mouth daily with breakfast.   [DISCONTINUED] glucose blood (ACCU-CHEK GUIDE) test strip USE 1 STRIP TO CHECK GLUCOSE THREE TIMES DAILY AS DIRECTED   [DISCONTINUED] levothyroxine (SYNTHROID) 50 MCG tablet TAKE 1 & 1/2 (ONE & ONE-HALF) TABLETS BY MOUTH ONCE DAILY   Dulaglutide (TRULICITY) 1.5 PJ/0.9TO SOPN Inject 1.5 mg into the skin once a week.   glipiZIDE (GLUCOTROL XL) 5 MG 24 hr tablet Take 1 tablet (5 mg total) by mouth daily  with breakfast.   glucose blood (ACCU-CHEK GUIDE) test strip USE 1 STRIP TO CHECK GLUCOSE THREE TIMES DAILY AS DIRECTED   levothyroxine (SYNTHROID) 75 MCG tablet Take 1 tablet (75 mcg total) by mouth daily before breakfast.   [DISCONTINUED] azithromycin (ZITHROMAX Z-PAK) 250 MG tablet Take two right away Then one a day for the next 4 days. (Patient not taking: Reported on 05/28/2021)   No facility-administered encounter medications on file as of 05/28/2021.    ALLERGIES: Allergies  Allergen Reactions   Penicillins Anaphylaxis, Hives and Other (See Comments)    Childhood Reaction. Has patient had a PCN reaction causing immediate rash, facial/tongue/throat swelling, SOB or lightheadedness with hypotension: Yes Has patient had a PCN reaction causing severe rash involving mucus membranes or skin necrosis: No Has patient had a PCN reaction that required hospitalization: No Has patient had a PCN reaction occurring within the last 10 years: No If all of the above answers are "NO", then may proceed with Cephalosporin use.    Niaspan [Niacin] Other (See Comments)    Patient passes out.    VACCINATION STATUS: Immunization History  Administered Date(s) Administered   Influenza,inj,Quad PF,6+ Mos 11/28/2012, 01/22/2014, 12/26/2014, 12/07/2015, 12/15/2016, 01/02/2018, 01/07/2019, 01/23/2020, 01/29/2021   PFIZER(Purple Top)SARS-COV-2 Vaccination 06/29/2019, 07/20/2019, 01/23/2020   Pneumococcal Polysaccharide-23 01/07/2019   Tdap 03/19/2013   Diabetes She presents for her follow-up diabetic visit. She has type 2 diabetes mellitus. Onset time: Diagnosed at approx age of 54. Her disease course has been worsening. Pertinent negatives for hypoglycemia include no nervousness/anxiousness or tremors. There are no diabetic associated symptoms. Pertinent negatives for diabetes include no fatigue and no weight loss. There are no hypoglycemic complications. Symptoms are stable. Diabetic complications include  nephropathy. Risk factors for coronary artery disease include diabetes mellitus, dyslipidemia, hypertension, obesity, sedentary lifestyle and tobacco exposure. Current diabetic treatment includes oral agent (monotherapy) (Trulicity 1.5 SQ weekly). She is compliant with treatment all of the time. Her weight is increasing steadily. She is following a generally unhealthy diet. When asked about meal planning, she reported none. She has not had a previous visit with a dietitian. She rarely participates in exercise. Her home blood glucose trend is increasing steadily. Her overall blood glucose range is >200 mg/dl. (She presents today, accompanied by family member, with her meter and logs showing inconsistent glucose monitoring pattern with gross hyperglycemia.  Her POCT A1c today is 9.8%, increasing from last visit of 7.1%.  She has struggled to regain control of her diabetes since she had COVID last year.  She reports she still has some effect of taste disturbances since covid.  She denies any hypoglycemia.  Yesterday she ate a  banana nut muffin and black coffee for breakfast, skipped lunch, and ate chicken nuggets with water for dinner.  She is not eating enough.) An ACE inhibitor/angiotensin II receptor blocker is not being taken. She does not see a podiatrist.Eye exam is current.  Thyroid Problem Presents for follow-up visit. Symptoms include weight gain. Patient reports no anxiety, cold intolerance, constipation, depressed mood, diarrhea, fatigue, heat intolerance, leg swelling, palpitations, tremors or weight loss. The symptoms have been stable. Her past medical history is significant for diabetes and hyperlipidemia.  Hyperlipidemia This is a chronic problem. The current episode started more than 1 year ago. The problem is uncontrolled. Recent lipid tests were reviewed and are variable. Exacerbating diseases include chronic renal disease, diabetes, hypothyroidism and obesity. Factors aggravating her  hyperlipidemia include fatty foods. Current antihyperlipidemic treatment includes statins and fibric acid derivatives. The current treatment provides moderate improvement of lipids. There are no compliance problems.  Risk factors for coronary artery disease include diabetes mellitus, dyslipidemia, hypertension, obesity and a sedentary lifestyle.    Review of systems  Constitutional: + steadily increasing body weight,  current Body mass index is 38.24 kg/m. , no fatigue, no subjective hyperthermia, no subjective hypothermia Eyes: no blurry vision, no xerophthalmia ENT: no sore throat, no nodules palpated in throat, no dysphagia/odynophagia, no hoarseness, + continued taste disturbances from previous covid infection Cardiovascular: no chest pain, no shortness of breath, no palpitations, no leg swelling Respiratory: no cough, no shortness of breath Gastrointestinal: no nausea/vomiting/diarrhea Musculoskeletal: no muscle/joint aches Skin: no rashes, no hyperemia Neurological: no tremors, no numbness, no tingling, no dizziness Psychiatric: no depression, no anxiety   Objective:    BP 122/82    Pulse 85    Ht _0  (1.549 m)    Wt 202 lb 6.4 oz (91.8 kg)    SpO2 98%    BMI 38.24 kg/m   Wt Readings from Last 3 Encounters:  05/28/21 202 lb 6.4 oz (91.8 kg)  01/29/21 198 lb (89.8 kg)  01/15/21 199 lb 6.4 oz (90.4 kg)    BP Readings from Last 3 Encounters:  05/28/21 122/82  01/29/21 125/83  01/15/21 118/78    Physical Exam- Limited  Constitutional:  Body mass index is 38.24 kg/m. , not in acute distress, normal state of mind Eyes:  EOMI, no exophthalmos Neck: Supple Cardiovascular: RRR, no murmurs, rubs, or gallops, no edema Respiratory: Adequate breathing efforts, no crackles, rales, rhonchi, or wheezing Musculoskeletal: no gross deformities, strength intact in all four extremities, no gross restriction of joint movements Skin:  no rashes, no hyperemia Neurological: no tremor with  outstretched hands    CMP     Component Value Date/Time   NA 144 01/29/2021 0803   K 5.2 01/29/2021 0803   CL 108 (H) 01/29/2021 0803   CO2 24 01/29/2021 0803   GLUCOSE 142 (H) 01/29/2021 0803   GLUCOSE 438 (H) 06/06/2017 0811   BUN 9 01/29/2021 0803   CREATININE 1.26 (H) 01/29/2021 0803   CREATININE 1.13 (H) 08/17/2012 1031   CALCIUM 8.9 01/29/2021 0803   PROT 5.9 (L) 01/29/2021 0803   ALBUMIN 3.8 01/29/2021 0803   AST 21 01/29/2021 0803   ALT 16 01/29/2021 0803   ALKPHOS 114 01/29/2021 0803   BILITOT 0.4 01/29/2021 0803   GFRNONAA 50 (L) 01/23/2020 1126   GFRNONAA 61 08/17/2012 1031   GFRAA 58 (L) 01/23/2020 1126   GFRAA 70 08/17/2012 1031     Diabetic Labs (most recent): Lab Results  Component Value Date  HGBA1C 9.8 (A) 05/28/2021   HGBA1C 7.1 (H) 01/29/2021   HGBA1C 7.8 (A) 01/15/2021     Lipid Panel ( most recent) Lipid Panel     Component Value Date/Time   CHOL 101 01/29/2021 0803   CHOL 85 08/17/2012 1031   TRIG 205 (H) 01/29/2021 0803   TRIG 161 (H) 08/17/2012 1031   HDL 29 (L) 01/29/2021 0803   HDL 26 (L) 08/17/2012 1031   CHOLHDL 3.5 01/29/2021 0803   CHOLHDL 10.3 11/29/2012 1030   VLDL 39 11/29/2012 1030   LDLCALC 39 01/29/2021 0803   LDLCALC 27 08/17/2012 1031     Lab Results  Component Value Date   TSH 1.010 01/04/2021   TSH 0.671 06/29/2020   TSH 1.080 01/23/2020   TSH 1.320 07/26/2019   TSH 0.619 04/22/2019   TSH 0.477 01/25/2019   TSH 0.854 10/15/2018   TSH 1.130 04/16/2018   TSH 0.346 (L) 01/02/2018   TSH 0.637 09/04/2017   FREET4 1.57 01/04/2021   FREET4 2.22 (H) 06/29/2020   FREET4 1.38 01/23/2020   FREET4 1.91 (H) 04/22/2019   FREET4 1.69 10/15/2018   FREET4 1.43 04/16/2018   FREET4 1.68 01/02/2018   FREET4 1.52 09/04/2017   FREET4 1.19 11/14/2014      Assessment & Plan:   1) Type 2 diabetes mellitus with stage 3 chronic kidney disease, without long-term current use of insulin ([place])  - Zeniyah L Coachman has  currently controlled type 2 DM since 51 years of age.  She presents today, accompanied by family member, with her meter and logs showing inconsistent glucose monitoring pattern with gross hyperglycemia.  Her POCT A1c today is 9.8%, increasing from last visit of 7.1%.  She has struggled to regain control of her diabetes since she had COVID last year.  She reports she still has some effect of taste disturbances since covid.  She denies any hypoglycemia.  Yesterday she ate a banana nut muffin and black coffee for breakfast, skipped lunch, and ate chicken nuggets with water for dinner.  She is not eating enough.  -her diabetes is complicated by obesity/sedentary life, CKD, smoking, and Dmya L Sear remains at a high risk for more acute and chronic complications which include CAD, CVA, CKD, retinopathy, and neuropathy. These are all discussed in detail with the patient.  - Nutritional counseling repeated at each appointment due to patients tendency to fall back in to old habits.  - The patient admits there is a room for improvement in their diet and drink choices. -  Suggestion is made for the patient to avoid simple carbohydrates from their diet including Cakes, Sweet Desserts / Pastries, Ice Cream, Soda (diet and regular), Sweet Tea, Candies, Chips, Cookies, Sweet Pastries, Store Bought Juices, Alcohol in Excess of 1-2 drinks a day, Artificial Sweeteners, Coffee Creamer, and "Sugar-free" Products. This will help patient to have stable blood glucose profile and potentially avoid unintended weight gain.   - I encouraged the patient to switch to unprocessed or minimally processed complex starch and increased protein intake (animal or plant source), fruits, and vegetables.   - Patient is advised to stick to a routine mealtimes to eat 3 meals a day and avoid unnecessary snacks (to snack only to correct hypoglycemia).  - I have approached her with the following individualized plan to manage diabetes and  patient agrees:   -She is advised to continue Trulicity 1.5 mg SQ weekly and Glipizide 5 mg XL daily with breakfast.  She is encouraged to start eating  well balanced meals 3 times per day which will help her regain control.  -She is advised to continue monitoring blood glucose at least once daily, before breakfast and to call the clinic if she has readings less than 70 or above 200 for 3 tests in a row.  - Patient specific target  A1c;  LDL, HDL, Triglycerides  were discussed in detail.  2) BP/HTN:  -Her blood pressure is controlled to target without the use of antihypertensive medications.  3) Lipids/HPL:    Her most recent lipid panel from 07/30/20 shows controlled LDL of 74 and elevated triglycerides of 185.  She is advised to continue Atorvastatin 40 mg po daily at bedtime.  Side effects and precautions discussed with her.  She is advised to avoid fried foods and butter.  4)  Weight/Diet:  Her Body mass index is 38.24 kg/m.-she is a candidate for modest weight loss.  CDE Consult has been  initiated , exercise, and detailed carbohydrates information provided.  5) Hypothyroidism: -Diagnosed at approximate age of 64 years.   There are no recent TFTs to review.  She is advised to continue Levothyroxine 75 mcg po daily before breakfast.  Will recheck TFTs prior to next visit and adjust dose if necessary.   - We discussed about the correct intake of her thyroid hormone, on empty stomach at fasting, with water, separated by at least 30 minutes from breakfast and other medications,  and separated by more than 4 hours from calcium, iron, multivitamins, acid reflux medications (PPIs). -Patient is made aware of the fact that thyroid hormone replacement is needed for life, dose to be adjusted by periodic monitoring of thyroid function tests.  6) Chronic Care/Health Maintenance: -she is on statin medications and is encouraged to continue to follow up with Ophthalmology, Dentist, Podiatrist at least  yearly or according to recommendations, and advised to away from smoking.    I have recommended yearly flu vaccine and pneumonia vaccination at least every 5 years; moderate intensity exercise for up to 150 minutes weekly; and  sleep for at least 7 hours a day.   - I advised patient to maintain close follow up with Dettinger, Fransisca Kaufmann, MD for primary care needs.     I spent 46 minutes in the care of the patient today including review of labs from [place], Lipids, Thyroid Function, Hematology (current and previous including abstractions from other facilities); face-to-face time discussing  her blood glucose readings/logs, discussing hypoglycemia and hyperglycemia episodes and symptoms, medications doses, her options of short and long term treatment based on the latest standards of care / guidelines;  discussion about incorporating lifestyle medicine;  and documenting the encounter.    Please refer to Patient Instructions for Blood Glucose Monitoring and Insulin/Medications Dosing Guide"  in media tab for additional information. Please  also refer to " Patient Self Inventory" in the Media  tab for reviewed elements of pertinent patient history.  Wendi Maya Bently participated in the discussions, expressed understanding, and voiced agreement with the above plans.  All questions were answered to her satisfaction. she is encouraged to contact clinic should she have any questions or concerns prior to her return visit.   Follow up plan: - Return in about 3 months (around 08/28/2021) for Diabetes F/U- A1c and UM in office, Thyroid follow up, Previsit labs, Bring meter and logs.  Rayetta Pigg, [facility] [address] [place], [place] [address] Phone: [phone] Fax: [phone]  05/28/2021, 9:26 AM

## 2021-06-11 ENCOUNTER — Ambulatory Visit: Payer: Medicaid Other | Admitting: Nurse Practitioner

## 2021-07-21 ENCOUNTER — Other Ambulatory Visit: Payer: Self-pay | Admitting: Family Medicine

## 2021-07-21 DIAGNOSIS — E785 Hyperlipidemia, unspecified: Secondary | ICD-10-CM

## 2021-07-21 DIAGNOSIS — I10 Essential (primary) hypertension: Secondary | ICD-10-CM

## 2021-07-21 DIAGNOSIS — E1142 Type 2 diabetes mellitus with diabetic polyneuropathy: Secondary | ICD-10-CM

## 2021-07-30 ENCOUNTER — Encounter: Payer: Self-pay | Admitting: Family Medicine

## 2021-07-30 ENCOUNTER — Ambulatory Visit: Payer: Medicaid Other | Admitting: Family Medicine

## 2021-07-30 VITALS — BP 118/86 | HR 76 | Temp 97.6°F | Resp 20 | Ht 61.0 in | Wt 202.0 lb

## 2021-07-30 DIAGNOSIS — E559 Vitamin D deficiency, unspecified: Secondary | ICD-10-CM

## 2021-07-30 DIAGNOSIS — N1832 Chronic kidney disease, stage 3b: Secondary | ICD-10-CM | POA: Diagnosis not present

## 2021-07-30 DIAGNOSIS — E1165 Type 2 diabetes mellitus with hyperglycemia: Secondary | ICD-10-CM

## 2021-07-30 DIAGNOSIS — E785 Hyperlipidemia, unspecified: Secondary | ICD-10-CM | POA: Diagnosis not present

## 2021-07-30 DIAGNOSIS — E039 Hypothyroidism, unspecified: Secondary | ICD-10-CM | POA: Diagnosis not present

## 2021-07-30 DIAGNOSIS — E782 Mixed hyperlipidemia: Secondary | ICD-10-CM | POA: Diagnosis not present

## 2021-07-30 DIAGNOSIS — I10 Essential (primary) hypertension: Secondary | ICD-10-CM | POA: Diagnosis not present

## 2021-07-30 DIAGNOSIS — E1142 Type 2 diabetes mellitus with diabetic polyneuropathy: Secondary | ICD-10-CM

## 2021-07-30 DIAGNOSIS — E1122 Type 2 diabetes mellitus with diabetic chronic kidney disease: Secondary | ICD-10-CM

## 2021-07-30 LAB — BAYER DCA HB A1C WAIVED: HB A1C (BAYER DCA - WAIVED): 8.5 % — ABNORMAL HIGH (ref 4.8–5.6)

## 2021-07-30 MED ORDER — FENOFIBRATE 145 MG PO TABS: 145.0000 mg | ORAL_TABLET | Freq: Every day | ORAL | 3 refills | Status: DC

## 2021-07-30 MED ORDER — GABAPENTIN 300 MG PO CAPS: 300.0000 mg | ORAL_CAPSULE | Freq: Every day | ORAL | 3 refills | Status: DC

## 2021-07-30 MED ORDER — ATORVASTATIN CALCIUM 40 MG PO TABS: 40.0000 mg | ORAL_TABLET | Freq: Every day | ORAL | 3 refills | Status: DC

## 2021-07-30 NOTE — Progress Notes (Signed)
? ?BP 118/86   Pulse 76   Temp 97.6 ?F (36.4 ?C) (Temporal)   Resp 20   Ht 5' 1"  (1.549 m)   Wt 202 lb (91.6 kg)   SpO2 94%   BMI 38.17 kg/m?   ? ?Subjective:  ? ?Patient ID: Diane Morgan, female    DOB: 23-May-1970, 51 y.o.   MRN: [id number] ? ?HPI: ?Diane Morgan is a 51 y.o. female presenting on 07/30/2021 for Hypothyroidism, Hypertension, and Diabetes ? ? ?HPI ?Type 2 diabetes mellitus ?Patient comes in today for recheck of his diabetes. Patient has been currently taking Trulicity and glipizide and also sees endocrinology, A1c is still slightly up at 8.5 more than we would like.. Patient is not currently on an ACE inhibitor/ARB. Patient has not seen an ophthalmologist this year. Patient denies any issues with their feet. The symptom started onset as an adult CKD and hypertension and hyperlipidemia ARE RELATED TO DM  ? ?Hypothyroidism recheck ?Patient is coming in for thyroid recheck today as well. They deny any issues with hair changes or heat or cold problems or diarrhea or constipation. They deny any chest pain or palpitations. They are currently on levothyroxine 75 micrograms  ? ?Hypertension ?Patient is currently on no medicine currently for blood pressure, and their blood pressure today is 118/86. Patient denies any lightheadedness or dizziness. Patient denies headaches, blurred vision, chest pains, shortness of breath, or weakness. Denies any side effects from medication and is content with current medication.  ? ?Hyperlipidemia and hypertriglyceridemia ?Patient is coming in for recheck of his hyperlipidemia. The patient is currently taking atorvastatin and fenofibrate. They deny any issues with myalgias or history of liver damage from it. They deny any focal numbness or weakness or chest pain.  ? ?Relevant past medical, surgical, family and social history reviewed and updated as indicated. Interim medical history since our last visit reviewed. ?Allergies and medications reviewed and  updated. ? ?Review of Systems  ?Constitutional:  Negative for chills and fever.  ?Eyes:  Negative for visual disturbance.  ?Respiratory:  Negative for chest tightness and shortness of breath.   ?Cardiovascular:  Negative for chest pain and leg swelling.  ?Musculoskeletal:  Negative for back pain and gait problem.  ?Skin:  Negative for rash.  ?Neurological:  Negative for dizziness, light-headedness and headaches.  ?Psychiatric/Behavioral:  Negative for agitation and behavioral problems.   ?All other systems reviewed and are negative. ? ?Per HPI unless specifically indicated above ? ? ?Allergies as of 07/30/2021   ? ?   Reactions  ? Penicillins Anaphylaxis, Hives, Other (See Comments)  ? Childhood Reaction. ?Has patient had a PCN reaction causing immediate rash, facial/tongue/throat swelling, SOB or lightheadedness with hypotension: Yes ?Has patient had a PCN reaction causing severe rash involving mucus membranes or skin necrosis: No ?Has patient had a PCN reaction that required hospitalization: No ?Has patient had a PCN reaction occurring within the last 10 years: No ?If all of the above answers are "NO", then may proceed with Cephalosporin use.  ? Niaspan [niacin] Other (See Comments)  ? Patient passes out.  ? ?  ? ?  ?Medication List  ?  ? ?  ? Accurate as of Jul 30, 2021 10:32 AM. If you have any questions, ask your nurse or doctor.  ?  ?  ? ?  ? ?STOP taking these medications   ? ?dexamethasone 6 MG tablet ?Commonly known as: DECADRON ?Stopped by: Worthy Rancher, MD ?  ? ?  ? ?TAKE  these medications   ? ?Accu-Chek Aviva Plus w/Device Kit ?USE AS DIRECTED ?  ?Accu-Chek Guide test strip ?Generic drug: glucose blood ?USE 1 STRIP TO CHECK GLUCOSE THREE TIMES DAILY AS DIRECTED ?  ?aspirin 81 MG chewable tablet ?Chew 81 mg by mouth daily. ?  ?atorvastatin 40 MG tablet ?Commonly known as: LIPITOR ?Take 1 tablet (40 mg total) by mouth daily. ?  ?cetirizine 10 MG tablet ?Commonly known as: ZYRTEC ?Take 10 mg by mouth  at bedtime. ?  ?cycloSPORINE 0.05 % ophthalmic emulsion ?Commonly known as: RESTASIS ?1 drop 2 (two) times daily. ?  ?fenofibrate 145 MG tablet ?Commonly known as: TRICOR ?Take 1 tablet (145 mg total) by mouth daily. ?  ?gabapentin 300 MG capsule ?Commonly known as: NEURONTIN ?Take 1 capsule (300 mg total) by mouth at bedtime. ?  ?glipiZIDE 5 MG 24 hr tablet ?Commonly known as: GLUCOTROL XL ?Take 1 tablet (5 mg total) by mouth daily with breakfast. ?  ?ibuprofen 200 MG tablet ?Commonly known as: ADVIL ?Take 400 mg by mouth every 6 (six) hours as needed for headache or moderate pain. ?  ?levothyroxine 75 MCG tablet ?Commonly known as: SYNTHROID ?Take 1 tablet (75 mcg total) by mouth daily before breakfast. ?  ?Trulicity 1.5 ZH/0.8MV Sopn ?Generic drug: Dulaglutide ?Inject 1.5 mg into the skin once a week. ?  ?Vitamin D (Ergocalciferol) 1.25 MG (50000 UNIT) Caps capsule ?Commonly known as: DRISDOL ?Take 1 capsule (50,000 Units total) by mouth every 7 (seven) days. ?  ? ?  ? ? ? ?Objective:  ? ?BP 118/86   Pulse 76   Temp 97.6 ?F (36.4 ?C) (Temporal)   Resp 20   Ht 5' 1"  (1.549 m)   Wt 202 lb (91.6 kg)   SpO2 94%   BMI 38.17 kg/m?   ?Wt Readings from Last 3 Encounters:  ?07/30/21 202 lb (91.6 kg)  ?05/28/21 202 lb 6.4 oz (91.8 kg)  ?01/29/21 198 lb (89.8 kg)  ?  ?Physical Exam ?Vitals and nursing note reviewed.  ?Constitutional:   ?   General: She is not in acute distress. ?   Appearance: She is well-developed. She is not diaphoretic.  ?Eyes:  ?   Conjunctiva/sclera: Conjunctivae normal.  ?Cardiovascular:  ?   Rate and Rhythm: Normal rate and regular rhythm.  ?   Heart sounds: Normal heart sounds. No murmur heard. ?Pulmonary:  ?   Effort: Pulmonary effort is normal. No respiratory distress.  ?   Breath sounds: Normal breath sounds. No wheezing.  ?Musculoskeletal:     ?   General: No swelling or tenderness. Normal range of motion.  ?Skin: ?   General: Skin is warm and dry.  ?   Findings: No rash.  ?Neurological:   ?   Mental Status: She is alert and oriented to person, place, and time.  ?   Coordination: Coordination normal.  ?Psychiatric:     ?   Behavior: Behavior normal.  ? ? ? ? ?Assessment & Plan:  ? ?Problem List Items Addressed This Visit   ? ?  ? Cardiovascular and Mediastinum  ? Hypertension - Primary  ? Relevant Medications  ? fenofibrate (TRICOR) 145 MG tablet  ? atorvastatin (LIPITOR) 40 MG tablet  ? Other Relevant Orders  ? CBC with Differential/Platelet  ? CMP14+EGFR  ?  ? Endocrine  ? Type 2 diabetes mellitus with stage 3 chronic kidney disease, without long-term current use of insulin ([place])  ? Relevant Medications  ? fenofibrate (TRICOR) 145 MG tablet  ?  atorvastatin (LIPITOR) 40 MG tablet  ? Hypothyroidism  ? Relevant Orders  ? Thyroid Panel With TSH  ? T4, Free  ? Type 2 diabetes mellitus with diabetic polyneuropathy ([facility])  ? Relevant Medications  ? gabapentin (NEURONTIN) 300 MG capsule  ? fenofibrate (TRICOR) 145 MG tablet  ? atorvastatin (LIPITOR) 40 MG tablet  ? Uncontrolled type 2 diabetes mellitus with hyperglycemia ([place])  ? Relevant Medications  ? atorvastatin (LIPITOR) 40 MG tablet  ? Other Relevant Orders  ? Bayer DCA Hb A1c Waived  ?  ? Other  ? Hyperlipidemia  ? Relevant Medications  ? fenofibrate (TRICOR) 145 MG tablet  ? atorvastatin (LIPITOR) 40 MG tablet  ? Other Relevant Orders  ? Lipid panel  ? ?Other Visit Diagnoses   ? ? Vitamin D deficiency      ? Relevant Orders  ? VITAMIN D 25 Hydroxy (Vit-D Deficiency, Fractures)  ? Dyslipidemia      ? Relevant Medications  ? fenofibrate (TRICOR) 145 MG tablet  ? atorvastatin (LIPITOR) 40 MG tablet  ? Essential hypertension      ? Relevant Medications  ? fenofibrate (TRICOR) 145 MG tablet  ? atorvastatin (LIPITOR) 40 MG tablet  ? ?  ?  ?Still on 3.5, sees endocrinology but I suggested that maybe she talk to them about increasing her Trulicity to see if that helps some more.  It is going in the right direction as its come down from 9 but is still  slightly up. ?Follow up plan: ?Return in about 3 months (around 10/30/2021), or if symptoms worsen or fail to improve, for Hypertension and diabetes and hypothyroidism.. ? ?Counseling provided for all of the vaccine components ?Orders

## 2021-07-31 LAB — CBC WITH DIFFERENTIAL/PLATELET
Basophils Absolute: 0.1 10*3/uL (ref 0.0–0.2)
Basos: 1 %
EOS (ABSOLUTE): 0.5 10*3/uL — ABNORMAL HIGH (ref 0.0–0.4)
Eos: 7 %
Hematocrit: 44.5 % (ref 34.0–46.6)
Hemoglobin: 14.8 g/dL (ref 11.1–15.9)
Immature Grans (Abs): 0 10*3/uL (ref 0.0–0.1)
Immature Granulocytes: 0 %
Lymphocytes Absolute: 2.6 10*3/uL (ref 0.7–3.1)
Lymphs: 36 %
MCH: 29.1 pg (ref 26.6–33.0)
MCHC: 33.3 g/dL (ref 31.5–35.7)
MCV: 87 fL (ref 79–97)
Monocytes Absolute: 0.6 10*3/uL (ref 0.1–0.9)
Monocytes: 8 %
Neutrophils Absolute: 3.4 10*3/uL (ref 1.4–7.0)
Neutrophils: 48 %
Platelets: 199 10*3/uL (ref 150–450)
RBC: 5.09 x10E6/uL (ref 3.77–5.28)
RDW: 13.2 % (ref 11.7–15.4)
WBC: 7.1 10*3/uL (ref 3.4–10.8)

## 2021-07-31 LAB — CMP14+EGFR
ALT: 34 IU/L — ABNORMAL HIGH (ref 0–32)
AST: 24 IU/L (ref 0–40)
Albumin/Globulin Ratio: 1.6 (ref 1.2–2.2)
Albumin: 3.6 g/dL — ABNORMAL LOW (ref 3.8–4.8)
Alkaline Phosphatase: 123 IU/L — ABNORMAL HIGH (ref 44–121)
BUN/Creatinine Ratio: 14 (ref 9–23)
BUN: 16 mg/dL (ref 6–24)
Bilirubin Total: 0.5 mg/dL (ref 0.0–1.2)
CO2: 21 mmol/L (ref 20–29)
Calcium: 9.3 mg/dL (ref 8.7–10.2)
Chloride: 105 mmol/L (ref 96–106)
Creatinine, Ser: 1.14 mg/dL — ABNORMAL HIGH (ref 0.57–1.00)
Globulin, Total: 2.2 g/dL (ref 1.5–4.5)
Glucose: 167 mg/dL — ABNORMAL HIGH (ref 70–99)
Potassium: 4.7 mmol/L (ref 3.5–5.2)
Sodium: 140 mmol/L (ref 134–144)
Total Protein: 5.8 g/dL — ABNORMAL LOW (ref 6.0–8.5)
eGFR: 59 mL/min/{1.73_m2} — ABNORMAL LOW (ref >59)

## 2021-07-31 LAB — LIPID PANEL
Chol/HDL Ratio: 3.5 ratio (ref 0.0–4.4)
Cholesterol, Total: 92 mg/dL — ABNORMAL LOW (ref 100–199)
HDL: 26 mg/dL — ABNORMAL LOW (ref >39)
LDL Chol Calc (NIH): 27 mg/dL (ref 0–99)
Triglycerides: 258 mg/dL — ABNORMAL HIGH (ref 0–149)
VLDL Cholesterol Cal: 39 mg/dL (ref 5–40)

## 2021-07-31 LAB — THYROID PANEL WITH TSH
Free Thyroxine Index: 3.2 (ref 1.2–4.9)
T3 Uptake Ratio: 33 % (ref 24–39)
T4, Total: 9.7 ug/dL (ref 4.5–12.0)
TSH: 1.16 u[IU]/mL (ref 0.450–4.500)

## 2021-07-31 LAB — T4, FREE: Free T4: 1.77 ng/dL (ref 0.82–1.77)

## 2021-07-31 LAB — VITAMIN D 25 HYDROXY (VIT D DEFICIENCY, FRACTURES): Vit D, 25-Hydroxy: 19.2 ng/mL — ABNORMAL LOW (ref 30.0–100.0)

## 2021-08-02 ENCOUNTER — Telehealth: Payer: Self-pay | Admitting: Nurse Practitioner

## 2021-08-02 MED ORDER — TRULICITY 4.5 MG/0.5ML ~~LOC~~ SOAJ: 4.5000 mg | SUBCUTANEOUS | 3 refills | Status: DC

## 2021-08-02 NOTE — Telephone Encounter (Signed)
Have her double up on her current 1.5 mg injections (taking 2 injections on the same day to equal 3 mg total) for now.  If she tolerates that well, we can increase to 4.5 mg.  The 3 mg dose is only meant to be a stepping stone to the 4.5 mg dose as it is not much more effective than her current dose).  I will send in for the 4.5 mg dose to her pharmacy.

## 2021-08-02 NOTE — Telephone Encounter (Signed)
Pt notified and agrees. She will call if she has issues with the 3mg  before starting the 4.5 mg.  ?

## 2021-08-02 NOTE — Telephone Encounter (Signed)
Patient's daughter left a VM stating she just seen her PCP and her A1c has increased to 8.5 & thinks her Trulicity needs to be increased but states you would have to do that. Please Advise ?

## 2021-08-30 ENCOUNTER — Encounter: Payer: Self-pay | Admitting: Nurse Practitioner

## 2021-08-30 ENCOUNTER — Telehealth (INDEPENDENT_AMBULATORY_CARE_PROVIDER_SITE_OTHER): Payer: Medicaid Other | Admitting: Nurse Practitioner

## 2021-08-30 VITALS — Wt 196.4 lb

## 2021-08-30 DIAGNOSIS — E559 Vitamin D deficiency, unspecified: Secondary | ICD-10-CM

## 2021-08-30 DIAGNOSIS — E1165 Type 2 diabetes mellitus with hyperglycemia: Secondary | ICD-10-CM | POA: Diagnosis not present

## 2021-08-30 DIAGNOSIS — I10 Essential (primary) hypertension: Secondary | ICD-10-CM

## 2021-08-30 DIAGNOSIS — E785 Hyperlipidemia, unspecified: Secondary | ICD-10-CM | POA: Diagnosis not present

## 2021-08-30 DIAGNOSIS — E039 Hypothyroidism, unspecified: Secondary | ICD-10-CM

## 2021-08-30 MED ORDER — VITAMIN D (ERGOCALCIFEROL) 1.25 MG (50000 UNIT) PO CAPS: 50000.0000 [IU] | ORAL_CAPSULE | ORAL | 0 refills | Status: DC

## 2021-08-30 NOTE — Progress Notes (Signed)
08/30/2021, 8:42 AM        Endocrinology follow-up note   TELEHEALTH VISIT: The patient is being engaged in telehealth visit.  This type of visit limits physical examination significantly, and thus is not preferable over face-to-face encounters.  I connected with  Diane Morgan on 08/30/21 by a video enabled telemedicine application and verified that I am speaking with the correct person using two identifiers.   I discussed the limitations of evaluation and management by telemedicine. The patient expressed understanding and agreed to proceed.    The participants involved in this visit include: Brita Romp, NP located at [facility] and [place]  located at their personal residence listed.    Subjective:    Patient ID: Diane Morgan, female    DOB: 20-Sep-1970.  Diane Morgan is being iseen in follow-up for management of currently controlled symptomatic type 2 diabetes, hypothyroidism, hyperlipidemia, hypertension. PMD:  Dettinger, Fransisca Kaufmann, MD.    Past Medical History:  Diagnosis Date   Anemia    Arthritis    Cataract    right   Cellulitis and abscess of trunk 11/28/2012   Diabetic peripheral neuropathy (HCC)    DM (diabetes mellitus) ([place])    Eczema    GERD (gastroesophageal reflux disease)    Hyperlipidemia    Hypertension    Hypothyroidism    Leg pain, bilateral 10/08/09   NIDDM (non-insulin dependent diabetes mellitus)    URI (upper respiratory infection) 07/01/10   Past Surgical History:  Procedure Laterality Date   CATARACT EXTRACTION W/PHACO Right 06/12/2017   Procedure: CATARACT EXTRACTION WITH PHACOEMULSIFICATION  AND INTRAOCULAR LENS PLACEMENT RIGHT EYE;  Surgeon: Tonny Branch, MD;  Location: [facility];  Service: Ophthalmology;  Laterality: Right;  CDE: 1.16   CHOLECYSTECTOMY     EYE SURGERY     right catracts   EYE SURGERY  12/2019   left cataract   LASER ABLATION CONDOLAMATA N/A 03/08/2017    Procedure: LASER ABLATION OF THE CERVIX;  Surgeon: Florian Buff, MD;  Location: [facility];  Service: Gynecology;  Laterality: N/A;   WISDOM TOOTH EXTRACTION     Social History   Socioeconomic History   Marital status: Single    Spouse name: Not on file   Number of children: Not on file   Years of education: Not on file   Highest education level: Not on file  Occupational History   Not on file  Tobacco Use   Smoking status: Every Day    Years: 1.00    Types: Cigarettes   Smokeless tobacco: Never   Tobacco comments:    smokes 4 cig daily  Vaping Use   Vaping Use: Never used  Substance and Sexual Activity   Alcohol use: No   Drug use: No   Sexual activity: Not Currently    Birth control/protection: None  Other Topics Concern   Not on file  Social History Narrative   Not on file   Social Determinants of Health   Financial Resource Strain: Not on file  Food Insecurity: Not on file  Transportation Needs: Not on file  Physical Activity: Not on file  Stress: Not on file  Social Connections: Not on file   Outpatient Encounter Medications as of 08/30/2021  Medication Sig   aspirin 81 MG chewable tablet Chew 81 mg by mouth daily.   atorvastatin (LIPITOR) 40 MG tablet Take 1 tablet (40 mg total) by mouth daily.  Blood Glucose Monitoring Suppl (ACCU-CHEK AVIVA PLUS) w/Device KIT USE AS DIRECTED   cetirizine (ZYRTEC) 10 MG tablet Take 10 mg by mouth at bedtime.    cycloSPORINE (RESTASIS) 0.05 % ophthalmic emulsion 1 drop 2 (two) times daily.   Dulaglutide (TRULICITY) 4.5 UL/8.4TX SOPN Inject 4.5 mg as directed once a week.   fenofibrate (TRICOR) 145 MG tablet Take 1 tablet (145 mg total) by mouth daily.   gabapentin (NEURONTIN) 300 MG capsule Take 1 capsule (300 mg total) by mouth at bedtime.   glipiZIDE (GLUCOTROL XL) 5 MG 24 hr tablet Take 1 tablet (5 mg total) by mouth daily with breakfast.   glucose blood (ACCU-CHEK GUIDE) test strip USE 1 STRIP TO CHECK GLUCOSE THREE TIMES  DAILY AS DIRECTED   ibuprofen (ADVIL,MOTRIN) 200 MG tablet Take 400 mg by mouth every 6 (six) hours as needed for headache or moderate pain.   levothyroxine (SYNTHROID) 75 MCG tablet Take 1 tablet (75 mcg total) by mouth daily before breakfast.   Vitamin D, Ergocalciferol, (DRISDOL) 1.25 MG (50000 UNIT) CAPS capsule Take 1 capsule (50,000 Units total) by mouth every 7 (seven) days.   [DISCONTINUED] Vitamin D, Ergocalciferol, (DRISDOL) 1.25 MG (50000 UNIT) CAPS capsule Take 1 capsule (50,000 Units total) by mouth every 7 (seven) days.   No facility-administered encounter medications on file as of 08/30/2021.    ALLERGIES: Allergies  Allergen Reactions   Penicillins Anaphylaxis, Hives and Other (See Comments)    Childhood Reaction. Has patient had a PCN reaction causing immediate rash, facial/tongue/throat swelling, SOB or lightheadedness with hypotension: Yes Has patient had a PCN reaction causing severe rash involving mucus membranes or skin necrosis: No Has patient had a PCN reaction that required hospitalization: No Has patient had a PCN reaction occurring within the last 10 years: No If all of the above answers are "NO", then may proceed with Cephalosporin use.    Niaspan [Niacin] Other (See Comments)    Patient passes out.    VACCINATION STATUS: Immunization History  Administered Date(s) Administered   Influenza,inj,Quad PF,6+ Mos 11/28/2012, 01/22/2014, 12/26/2014, 12/07/2015, 12/15/2016, 01/02/2018, 01/07/2019, 01/23/2020, 01/29/2021   PFIZER(Purple Top)SARS-COV-2 Vaccination 06/29/2019, 07/20/2019, 01/23/2020   Pneumococcal Polysaccharide-23 01/07/2019   Tdap 03/19/2013   Diabetes She presents for her follow-up diabetic visit. She has type 2 diabetes mellitus. Onset time: Diagnosed at approx age of 7. Her disease course has been improving. Pertinent negatives for hypoglycemia include no nervousness/anxiousness or tremors. There are no diabetic associated symptoms. Pertinent  negatives for diabetes include no fatigue and no weight loss. There are no hypoglycemic complications. Symptoms are stable. Diabetic complications include nephropathy. Risk factors for coronary artery disease include diabetes mellitus, dyslipidemia, hypertension, obesity, sedentary lifestyle and tobacco exposure. Current diabetic treatment includes oral agent (monotherapy) (Trulicity 4.5 SQ weekly). She is compliant with treatment all of the time. Her weight is decreasing steadily. She is following a generally healthy diet. When asked about meal planning, she reported none. She has not had a previous visit with a dietitian. She rarely participates in exercise. Her home blood glucose trend is increasing steadily. Her overall blood glucose range is >200 mg/dl. (She presents today, accompanied by family member, for her virtual visit with fasting readings above target still.  Her previsit A1c was 8.5% on 5/12, improving from last visit of 9.8%.  She has not been checking her glucose before bed as asked.  She denies any significant hypoglycemia.) An ACE inhibitor/angiotensin II receptor blocker is not being taken. She does not see a  podiatrist.Eye exam is current.  Thyroid Problem Presents for follow-up visit. Symptoms include weight gain. Patient reports no anxiety, cold intolerance, constipation, depressed mood, diarrhea, fatigue, heat intolerance, leg swelling, palpitations, tremors or weight loss. The symptoms have been stable. Her past medical history is significant for diabetes and hyperlipidemia.  Hyperlipidemia This is a chronic problem. The current episode started more than 1 year ago. The problem is uncontrolled. Recent lipid tests were reviewed and are variable. Exacerbating diseases include chronic renal disease, diabetes, hypothyroidism and obesity. Factors aggravating her hyperlipidemia include fatty foods. Current antihyperlipidemic treatment includes statins and fibric acid derivatives. The current  treatment provides moderate improvement of lipids. There are no compliance problems.  Risk factors for coronary artery disease include diabetes mellitus, dyslipidemia, hypertension, obesity and a sedentary lifestyle.   Review of systems  Constitutional: + steadily decreasing body weight,  current Body mass index is 37.11 kg/m. , no fatigue, no subjective hyperthermia, no subjective hypothermia Eyes: no blurry vision, no xerophthalmia ENT: no sore throat, no nodules palpated in throat, no dysphagia/odynophagia, no hoarseness Cardiovascular: no chest pain, no shortness of breath, no palpitations, no leg swelling Respiratory: no cough, no shortness of breath Gastrointestinal: no nausea/vomiting/diarrhea Musculoskeletal: no muscle/joint aches Skin: no rashes, no hyperemia Neurological: no tremors, no numbness, no tingling, no dizziness Psychiatric: no depression, no anxiety   Objective:    Wt 196 lb 6.4 oz (89.1 kg)   BMI 37.11 kg/m   Wt Readings from Last 3 Encounters:  08/30/21 196 lb 6.4 oz (89.1 kg)  07/30/21 202 lb (91.6 kg)  05/28/21 202 lb 6.4 oz (91.8 kg)    BP Readings from Last 3 Encounters:  07/30/21 118/86  05/28/21 122/82  01/29/21 125/83   Physical Exam- Telehealth- significantly limited due to nature of visit  Constitutional: Body mass index is 37.11 kg/m. , not in acute distress, normal state of mind Respiratory: Adequate breathing efforts    CMP     Component Value Date/Time   NA 140 07/30/2021 0927   K 4.7 07/30/2021 0927   CL 105 07/30/2021 0927   CO2 21 07/30/2021 0927   GLUCOSE 167 (H) 07/30/2021 0927   GLUCOSE 438 (H) 06/06/2017 0811   BUN 16 07/30/2021 0927   CREATININE 1.14 (H) 07/30/2021 0927   CREATININE 1.13 (H) 08/17/2012 1031   CALCIUM 9.3 07/30/2021 0927   PROT 5.8 (L) 07/30/2021 0927   ALBUMIN 3.6 (L) 07/30/2021 0927   AST 24 07/30/2021 0927   ALT 34 (H) 07/30/2021 0927   ALKPHOS 123 (H) 07/30/2021 0927   BILITOT 0.5 07/30/2021 0927    GFRNONAA 50 (L) 01/23/2020 1126   GFRNONAA 61 08/17/2012 1031   GFRAA 58 (L) 01/23/2020 1126   GFRAA 70 08/17/2012 1031     Diabetic Labs (most recent): Lab Results  Component Value Date   HGBA1C 8.5 (H) 07/30/2021   HGBA1C 9.8 (A) 05/28/2021   HGBA1C 7.1 (H) 01/29/2021   MICROALBUR 150 10/09/2020   MICROALBUR 20 11/14/2014   MICROALBUR negative 06/19/2013     Lipid Panel ( most recent) Lipid Panel     Component Value Date/Time   CHOL 92 (L) 07/30/2021 0927   CHOL 85 08/17/2012 1031   TRIG 258 (H) 07/30/2021 0927   TRIG 161 (H) 08/17/2012 1031   HDL 26 (L) 07/30/2021 0927   HDL 26 (L) 08/17/2012 1031   CHOLHDL 3.5 07/30/2021 0927   CHOLHDL 10.3 11/29/2012 1030   VLDL 39 11/29/2012 1030   LDLCALC 27 07/30/2021 0927  [place] 27 08/17/2012 1031     Lab Results  Component Value Date   TSH 1.160 07/30/2021   TSH 1.010 01/04/2021   TSH 0.671 06/29/2020   TSH 1.080 01/23/2020   TSH 1.320 07/26/2019   TSH 0.619 04/22/2019   TSH 0.477 01/25/2019   TSH 0.854 10/15/2018   TSH 1.130 04/16/2018   TSH 0.346 (L) 01/02/2018   FREET4 1.77 07/30/2021   FREET4 1.57 01/04/2021   FREET4 2.22 (H) 06/29/2020   FREET4 1.38 01/23/2020   FREET4 1.91 (H) 04/22/2019   FREET4 1.69 10/15/2018   FREET4 1.43 04/16/2018   FREET4 1.68 01/02/2018   FREET4 1.52 09/04/2017   FREET4 1.19 11/14/2014      Assessment & Plan:   1) Type 2 diabetes mellitus with stage 3 chronic kidney disease, without long-term current use of insulin ([place])  - Salene L Petrilla has currently controlled type 2 DM since 51 years of age.  She presents today, accompanied by family member, for her virtual visit with fasting readings above target still.  Her previsit A1c was 8.5% on 5/12, improving from last visit of 9.8%.  She has not been checking her glucose before bed as asked.  She denies any significant hypoglycemia.  -her diabetes is complicated by obesity/sedentary life, CKD, smoking, and Navi L Revels  remains at a high risk for more acute and chronic complications which include CAD, CVA, CKD, retinopathy, and neuropathy. These are all discussed in detail with the patient.  - Nutritional counseling repeated at each appointment due to patients tendency to fall back in to old habits.  - The patient admits there is a room for improvement in their diet and drink choices. -  Suggestion is made for the patient to avoid simple carbohydrates from their diet including Cakes, Sweet Desserts / Pastries, Ice Cream, Soda (diet and regular), Sweet Tea, Candies, Chips, Cookies, Sweet Pastries, Store Bought Juices, Alcohol in Excess of 1-2 drinks a day, Artificial Sweeteners, Coffee Creamer, and "Sugar-free" Products. This will help patient to have stable blood glucose profile and potentially avoid unintended weight gain.   - I encouraged the patient to switch to unprocessed or minimally processed complex starch and increased protein intake (animal or plant source), fruits, and vegetables.   - Patient is advised to stick to a routine mealtimes to eat 3 meals a day and avoid unnecessary snacks (to snack only to correct hypoglycemia).  - I have approached her with the following individualized plan to manage diabetes and patient agrees:   -She is advised to continue Trulicity 4.5 mg SQ weekly and Glipizide 5 mg XL daily with breakfast.  She is encouraged to start eating well balanced meals 3 times per day which will help her regain control.  I also asked her to start routinely monitoring glucose before bed to see if she may be dumping at night as a result of the Glipizide.  -She is advised to continue monitoring blood glucose at least once daily, before breakfast and to call the clinic if she has readings less than 70 or above 200 for 3 tests in a row.  - Patient specific target  A1c;  LDL, HDL, Triglycerides  were discussed in detail.  2) BP/HTN:  -Her blood pressure is controlled to target without the use of  antihypertensive medications.  3) Lipids/HPL:    Her most recent lipid panel from 07/30/21 shows controlled LDL of 27 and elevated triglycerides of 258.  She is advised to continue Atorvastatin 40 mg po  daily at bedtime.  Side effects and precautions discussed with her.  She is advised to avoid fried foods and butter.  4)  Weight/Diet:  Her Body mass index is 37.11 kg/m.-she is a candidate for modest weight loss.  CDE Consult has been  initiated , exercise, and detailed carbohydrates information provided.  5) Hypothyroidism: -Diagnosed at approximate age of 49 years.   Her previsit thyroid function tests are consistent with appropriate hormone replacement.  She is advised to continue Levothyroxine 75 mcg po daily before breakfast.     - We discussed about the correct intake of her thyroid hormone, on empty stomach at fasting, with water, separated by at least 30 minutes from breakfast and other medications,  and separated by more than 4 hours from calcium, iron, multivitamins, acid reflux medications (PPIs). -Patient is made aware of the fact that thyroid hormone replacement is needed for life, dose to be adjusted by periodic monitoring of thyroid function tests.  6) Chronic Care/Health Maintenance: -she is on statin medications and is encouraged to continue to follow up with Ophthalmology, Dentist, Podiatrist at least yearly or according to recommendations, and advised to away from smoking.    I have recommended yearly flu vaccine and pneumonia vaccination at least every 5 years; moderate intensity exercise for up to 150 minutes weekly; and  sleep for at least 7 hours a day.  7) Vitamin D deficiency Her most recent vitamin  D level was 19.2.  I discussed and reinitiated Ergocalciferol 50000 units weekly.  She can also take the OTC Vitamin D3 5000 units daily to help boost her numbers.  - I advised patient to maintain close follow up with Dettinger, Fransisca Kaufmann, MD for primary care  needs.      I spent 30 minutes dedicated to the care of this patient on the date of this encounter to include pre-visit review of records, face-to-face time with the patient, and post visit ordering of  testing.    Please refer to Patient Instructions for Blood Glucose Monitoring and Insulin/Medications Dosing Guide"  in media tab for additional information. Please  also refer to " Patient Self Inventory" in the Media  tab for reviewed elements of pertinent patient history.  Wendi Maya Mathers participated in the discussions, expressed understanding, and voiced agreement with the above plans.  All questions were answered to her satisfaction. she is encouraged to contact clinic should she have any questions or concerns prior to her return visit.   Follow up plan: - Return in about 3 months (around 11/30/2021) for Diabetes F/U with A1c in office, No previsit labs, Bring meter and logs.  Rayetta Pigg, [facility] [address] [place], [place] [address] Phone: [phone] Fax: [phone]  08/30/2021, 8:42 AM

## 2021-08-30 NOTE — Patient Instructions (Signed)
Diabetes Mellitus and Foot Care Foot care is an important part of your health, especially when you have diabetes. Diabetes may cause you to have problems because of poor blood flow (circulation) to your feet and legs, which can cause your skin to: Become thinner and drier. Break more easily. Heal more slowly. Peel and crack. You may also have nerve damage (neuropathy) in your legs and feet, causing decreased feeling in them. This means that you may not notice minor injuries to your feet that could lead to more serious problems. Noticing and addressing any potential problems early is the best way to prevent future foot problems. How to care for your feet Foot hygiene  Wash your feet daily with warm water and mild soap. Do not use hot water. Then, pat your feet and the areas between your toes until they are completely dry. Do not soak your feet as this can dry your skin. Trim your toenails straight across. Do not dig under them or around the cuticle. File the edges of your nails with an emery board or nail file. Apply a moisturizing lotion or petroleum jelly to the skin on your feet and to dry, brittle toenails. Use lotion that does not contain alcohol and is unscented. Do not apply lotion between your toes. Shoes and socks Wear clean socks or stockings every day. Make sure they are not too tight. Do not wear knee-high stockings since they may decrease blood flow to your legs. Wear shoes that fit properly and have enough cushioning. Always look in your shoes before you put them on to be sure there are no objects inside. To break in new shoes, wear them for just a few hours a day. This prevents injuries on your feet. Wounds, scrapes, corns, and calluses  Check your feet daily for blisters, cuts, bruises, sores, and redness. If you cannot see the bottom of your feet, use a mirror or ask someone for help. Do not cut corns or calluses or try to remove them with medicine. If you find a minor scrape,  cut, or break in the skin on your feet, keep it and the skin around it clean and dry. You may clean these areas with mild soap and water. Do not clean the area with peroxide, alcohol, or iodine. If you have a wound, scrape, corn, or callus on your foot, look at it several times a day to make sure it is healing and not infected. Check for: Redness, swelling, or pain. Fluid or blood. Warmth. Pus or a bad smell. General tips Do not cross your legs. This may decrease blood flow to your feet. Do not use heating pads or hot water bottles on your feet. They may burn your skin. If you have lost feeling in your feet or legs, you may not know this is happening until it is too late. Protect your feet from hot and cold by wearing shoes, such as at the beach or on hot pavement. Schedule a complete foot exam at least once a year (annually) or more often if you have foot problems. Report any cuts, sores, or bruises to your health care provider immediately. Where to find more information American Diabetes Association: www.diabetes.org Association of Diabetes Care & Education Specialists: www.diabeteseducator.org Contact a health care provider if: You have a medical condition that increases your risk of infection and you have any cuts, sores, or bruises on your feet. You have an injury that is not healing. You have redness on your legs or feet. You   feel burning or tingling in your legs or feet. You have pain or cramps in your legs and feet. Your legs or feet are numb. Your feet always feel cold. You have pain around any toenails. Get help right away if: You have a wound, scrape, corn, or callus on your foot and: You have pain, swelling, or redness that gets worse. You have fluid or blood coming from the wound, scrape, corn, or callus. Your wound, scrape, corn, or callus feels warm to the touch. You have pus or a bad smell coming from the wound, scrape, corn, or callus. You have a fever. You have a red  line going up your leg. Summary Check your feet every day for blisters, cuts, bruises, sores, and redness. Apply a moisturizing lotion or petroleum jelly to the skin on your feet and to dry, brittle toenails. Wear shoes that fit properly and have enough cushioning. If you have foot problems, report any cuts, sores, or bruises to your health care provider immediately. Schedule a complete foot exam at least once a year (annually) or more often if you have foot problems. This information is not intended to replace advice given to you by your health care provider. Make sure you discuss any questions you have with your health care provider. Document Revised: 09/26/2019 Document Reviewed: 09/26/2019 Elsevier Patient Education  2023 Elsevier Inc.  

## 2021-09-01 ENCOUNTER — Telehealth: Payer: Self-pay | Admitting: Nurse Practitioner

## 2021-09-01 NOTE — Telephone Encounter (Signed)
Pt said she needs to know does she take the Vitamin D OTC or the RX?

## 2021-09-01 NOTE — Telephone Encounter (Signed)
Left a message requesting pt return call to the office. ?

## 2021-09-02 NOTE — Telephone Encounter (Signed)
Discussed with pt's mother that at pt's last visit [facility] recommended pt take both the Rx Vitamin D 96789 units weekly and the OTC 5000 units daily. Understanding voiced.

## 2021-10-29 ENCOUNTER — Ambulatory Visit: Payer: Medicaid Other | Admitting: Family Medicine

## 2021-10-29 ENCOUNTER — Encounter: Payer: Self-pay | Admitting: Family Medicine

## 2021-10-29 VITALS — BP 126/89 | HR 89 | Temp 97.5°F | Ht 61.0 in | Wt 189.0 lb

## 2021-10-29 DIAGNOSIS — E782 Mixed hyperlipidemia: Secondary | ICD-10-CM | POA: Diagnosis not present

## 2021-10-29 DIAGNOSIS — E039 Hypothyroidism, unspecified: Secondary | ICD-10-CM

## 2021-10-29 DIAGNOSIS — E1165 Type 2 diabetes mellitus with hyperglycemia: Secondary | ICD-10-CM | POA: Diagnosis not present

## 2021-10-29 DIAGNOSIS — N1832 Chronic kidney disease, stage 3b: Secondary | ICD-10-CM | POA: Diagnosis not present

## 2021-10-29 DIAGNOSIS — E1122 Type 2 diabetes mellitus with diabetic chronic kidney disease: Secondary | ICD-10-CM | POA: Diagnosis not present

## 2021-10-29 DIAGNOSIS — I10 Essential (primary) hypertension: Secondary | ICD-10-CM | POA: Diagnosis not present

## 2021-10-29 LAB — BAYER DCA HB A1C WAIVED: HB A1C (BAYER DCA - WAIVED): 7.7 % — ABNORMAL HIGH (ref 4.8–5.6)

## 2021-10-29 NOTE — Progress Notes (Signed)
BP 126/89   Pulse 89   Temp (!) 97.5 F (36.4 C)   Ht 5' 1"  (1.549 m)   Wt 189 lb (85.7 kg)   SpO2 98%   BMI 35.71 kg/m    Subjective:   Patient ID: Diane Morgan, female    DOB: January 24, 1971, 51 y.o.   MRN: [id number]  HPI: Diane Morgan is a 51 y.o. female presenting on 10/29/2021 for Medical Management of Chronic Issues, Hypertension, and Diabetes   HPI Type 2 diabetes mellitus Patient comes in today for recheck of his diabetes. Patient has been currently taking glipizide. Patient is not currently on an ACE inhibitor/ARB. Patient has not seen an ophthalmologist this year. Patient denies any issues with their feet. The symptom started onset as an adult hypertension and hypothyroidism and hyperlipidemia ARE RELATED TO DM   Hypertension Patient is currently on no medicine currently, and their blood pressure today is 126/89. Patient denies any lightheadedness or dizziness. Patient denies headaches, blurred vision, chest pains, shortness of breath, or weakness. Denies any side effects from medication and is content with current medication.   Hypothyroidism recheck Patient is coming in for thyroid recheck today as well. They deny any issues with hair changes or heat or cold problems or diarrhea or constipation. They deny any chest pain or palpitations. They are currently on levothyroxine 75 micrograms   Hyperlipidemia Patient is coming in for recheck of his hyperlipidemia. The patient is currently taking fenofibrate and atorvastatin. They deny any issues with myalgias or history of liver damage from it. They deny any focal numbness or weakness or chest pain.   Relevant past medical, surgical, family and social history reviewed and updated as indicated. Interim medical history since our last visit reviewed. Allergies and medications reviewed and updated.  Review of Systems  Constitutional:  Negative for chills and fever.  Eyes:  Negative for redness and visual disturbance.   Respiratory:  Negative for chest tightness and shortness of breath.   Cardiovascular:  Negative for chest pain and leg swelling.  Musculoskeletal:  Negative for back pain and gait problem.  Skin:  Negative for rash.  Neurological:  Negative for dizziness, light-headedness and headaches.  Psychiatric/Behavioral:  Negative for agitation and behavioral problems.   All other systems reviewed and are negative.   Per HPI unless specifically indicated above   Allergies as of 10/29/2021       Reactions   Penicillins Anaphylaxis, Hives, Other (See Comments)   Childhood Reaction. Has patient had a PCN reaction causing immediate rash, facial/tongue/throat swelling, SOB or lightheadedness with hypotension: Yes Has patient had a PCN reaction causing severe rash involving mucus membranes or skin necrosis: No Has patient had a PCN reaction that required hospitalization: No Has patient had a PCN reaction occurring within the last 10 years: No If all of the above answers are "NO", then may proceed with Cephalosporin use.   Niaspan [niacin] Other (See Comments)   Patient passes out.        Medication List        Accurate as of October 29, 2021 10:05 AM. If you have any questions, ask your nurse or doctor.          Accu-Chek Aviva Plus w/Device Kit USE AS DIRECTED   Accu-Chek Guide test strip Generic drug: glucose blood USE 1 STRIP TO CHECK GLUCOSE THREE TIMES DAILY AS DIRECTED   aspirin 81 MG chewable tablet Chew 81 mg by mouth daily.   atorvastatin 40 MG tablet  Commonly known as: LIPITOR Take 1 tablet (40 mg total) by mouth daily.   cetirizine 10 MG tablet Commonly known as: ZYRTEC Take 10 mg by mouth at bedtime.   cycloSPORINE 0.05 % ophthalmic emulsion Commonly known as: RESTASIS 1 drop 2 (two) times daily.   fenofibrate 145 MG tablet Commonly known as: TRICOR Take 1 tablet (145 mg total) by mouth daily.   gabapentin 300 MG capsule Commonly known as: NEURONTIN Take  1 capsule (300 mg total) by mouth at bedtime.   glipiZIDE 5 MG 24 hr tablet Commonly known as: GLUCOTROL XL Take 1 tablet (5 mg total) by mouth daily with breakfast.   ibuprofen 200 MG tablet Commonly known as: ADVIL Take 400 mg by mouth every 6 (six) hours as needed for headache or moderate pain.   levothyroxine 75 MCG tablet Commonly known as: SYNTHROID Take 1 tablet (75 mcg total) by mouth daily before breakfast.   Trulicity 4.5 EH/2.1YY Sopn Generic drug: Dulaglutide Inject 4.5 mg as directed once a week.   Vitamin D (Ergocalciferol) 1.25 MG (50000 UNIT) Caps capsule Commonly known as: DRISDOL Take 1 capsule (50,000 Units total) by mouth every 7 (seven) days.         Objective:   BP 126/89   Pulse 89   Temp (!) 97.5 F (36.4 C)   Ht 5' 1"  (1.549 m)   Wt 189 lb (85.7 kg)   SpO2 98%   BMI 35.71 kg/m   Wt Readings from Last 3 Encounters:  10/29/21 189 lb (85.7 kg)  08/30/21 196 lb 6.4 oz (89.1 kg)  07/30/21 202 lb (91.6 kg)    Physical Exam Vitals and nursing note reviewed.  Constitutional:      General: She is not in acute distress.    Appearance: She is well-developed. She is not diaphoretic.  Eyes:     Conjunctiva/sclera: Conjunctivae normal.  Cardiovascular:     Rate and Rhythm: Normal rate and regular rhythm.     Heart sounds: Normal heart sounds. No murmur heard. Pulmonary:     Effort: Pulmonary effort is normal. No respiratory distress.     Breath sounds: Normal breath sounds. No wheezing.  Musculoskeletal:        General: No swelling or tenderness. Normal range of motion.  Skin:    General: Skin is warm and dry.     Findings: No rash.  Neurological:     Mental Status: She is alert and oriented to person, place, and time.     Coordination: Coordination normal.  Psychiatric:        Behavior: Behavior normal.       Assessment & Plan:   Problem List Items Addressed This Visit       Cardiovascular and Mediastinum   Hypertension      Endocrine   Type 2 diabetes mellitus with stage 3 chronic kidney disease, without long-term current use of insulin (HCC)   Hypothyroidism   Uncontrolled type 2 diabetes mellitus with hyperglycemia ([place]) - Primary   Relevant Orders   Bayer DCA Hb A1c Waived     Other   Hyperlipidemia  A1c is improved at 7.5, no changes for today, continue to monitor and continue to focus on getting blood sugars down.  She seems to be doing well on her medicine.  Follow up plan: Return in about 3 months (around 01/29/2022), or if symptoms worsen or fail to improve, for dm and htn.  Counseling provided for all of the vaccine components Orders Placed This  Encounter  Procedures   [facility] Hb A1c [place] Carcamo, MD [facility] 10/29/2021, 10:05 AM

## 2021-11-01 ENCOUNTER — Other Ambulatory Visit: Payer: Self-pay

## 2021-11-01 MED ORDER — ACCU-CHEK GUIDE VI STRP: ORAL_STRIP | 3 refills | Status: DC

## 2021-11-19 ENCOUNTER — Other Ambulatory Visit: Payer: Self-pay | Admitting: Nurse Practitioner

## 2021-12-03 ENCOUNTER — Encounter: Payer: Self-pay | Admitting: Nurse Practitioner

## 2021-12-03 ENCOUNTER — Ambulatory Visit: Payer: Medicaid Other | Admitting: Nurse Practitioner

## 2021-12-03 VITALS — BP 126/86 | HR 71 | Ht 61.0 in | Wt 190.4 lb

## 2021-12-03 DIAGNOSIS — E039 Hypothyroidism, unspecified: Secondary | ICD-10-CM

## 2021-12-03 DIAGNOSIS — I10 Essential (primary) hypertension: Secondary | ICD-10-CM | POA: Diagnosis not present

## 2021-12-03 DIAGNOSIS — E782 Mixed hyperlipidemia: Secondary | ICD-10-CM | POA: Diagnosis not present

## 2021-12-03 DIAGNOSIS — E1165 Type 2 diabetes mellitus with hyperglycemia: Secondary | ICD-10-CM | POA: Diagnosis not present

## 2021-12-03 DIAGNOSIS — E559 Vitamin D deficiency, unspecified: Secondary | ICD-10-CM | POA: Diagnosis not present

## 2021-12-03 LAB — POCT UA - MICROALBUMIN
Albumin/Creatinine Ratio, Urine, POC: <30
Creatinine, POC: 200 mg/dL
Microalbumin Ur, POC: 30 mg/L

## 2021-12-03 NOTE — Progress Notes (Signed)
12/03/2021, 10:54 AM        Endocrinology follow-up note    Subjective:    Patient ID: Diane Morgan, female    DOB: [birth year].  Diane Morgan is being iseen in follow-up for management of currently controlled symptomatic type 2 diabetes, hypothyroidism, hyperlipidemia, hypertension. PMD:  Dettinger, Fransisca Kaufmann, MD.    Past Medical History:  Diagnosis Date   Anemia    Arthritis    Cataract    right   Cellulitis and abscess of trunk 11/28/2012   Diabetic peripheral neuropathy (HCC)    DM (diabetes mellitus) ([place])    Eczema    GERD (gastroesophageal reflux disease)    Hyperlipidemia    Hypertension    Hypothyroidism    Leg pain, bilateral 10/08/09   NIDDM (non-insulin dependent diabetes mellitus)    URI (upper respiratory infection) 07/01/10   Past Surgical History:  Procedure Laterality Date   CATARACT EXTRACTION W/PHACO Right 06/12/2017   Procedure: CATARACT EXTRACTION WITH PHACOEMULSIFICATION  AND INTRAOCULAR LENS PLACEMENT RIGHT EYE;  Surgeon: Tonny Branch, MD;  Location: [facility];  Service: Ophthalmology;  Laterality: Right;  CDE: 1.16   CHOLECYSTECTOMY     EYE SURGERY     right catracts   EYE SURGERY  12/2019   left cataract   LASER ABLATION CONDOLAMATA N/A 03/08/2017   Procedure: LASER ABLATION OF THE CERVIX;  Surgeon: Florian Buff, MD;  Location: [facility];  Service: Gynecology;  Laterality: N/A;   WISDOM TOOTH EXTRACTION     Social History   Socioeconomic History   Marital status: Single    Spouse name: Not on file   Number of children: Not on file   Years of education: Not on file   Highest education level: Not on file  Occupational History   Not on file  Tobacco Use   Smoking status: Every Day    Years: 1.00    Types: Cigarettes   Smokeless tobacco: Never   Tobacco comments:    smokes 4 cig daily  Vaping Use   Vaping Use: Never used  Substance and Sexual Activity   Alcohol use: No   Drug use: No   Sexual activity: Not  Currently    Birth control/protection: None  Other Topics Concern   Not on file  Social History Narrative   Not on file   Social Determinants of Health   Financial Resource Strain: Not on file  Food Insecurity: Not on file  Transportation Needs: Not on file  Physical Activity: Not on file  Stress: Not on file  Social Connections: Not on file   Outpatient Encounter Medications as of 12/03/2021  Medication Sig   aspirin 81 MG chewable tablet Chew 81 mg by mouth daily.   atorvastatin (LIPITOR) 40 MG tablet Take 1 tablet (40 mg total) by mouth daily.   Blood Glucose Monitoring Suppl (ACCU-CHEK AVIVA PLUS) w/Device KIT USE AS DIRECTED   cetirizine (ZYRTEC) 10 MG tablet Take 10 mg by mouth at bedtime.    cycloSPORINE (RESTASIS) 0.05 % ophthalmic emulsion 1 drop 2 (two) times daily.   fenofibrate (TRICOR) 145 MG tablet Take 1 tablet (145 mg total) by mouth daily.   gabapentin (NEURONTIN) 300 MG capsule Take 1 capsule (300 mg total) by mouth at bedtime.   glipiZIDE (GLUCOTROL XL) 5 MG 24 hr tablet Take 1 tablet (5 mg total) by mouth daily with breakfast.   glucose blood (ACCU-CHEK GUIDE) test strip USE 1 STRIP  TO CHECK GLUCOSE THREE TIMES DAILY AS DIRECTED Dx E11.65   ibuprofen (ADVIL,MOTRIN) 200 MG tablet Take 400 mg by mouth every 6 (six) hours as needed for headache or moderate pain.   levothyroxine (SYNTHROID) 75 MCG tablet Take 1 tablet (75 mcg total) by mouth daily before breakfast.   TRULICITY 4.5 FW/2.6VZ SOPN INJECT 4 & 1/2 (FOUR & ONE-HALF) MG SUBCUTANEOUSLY ONCE A WEEK   Vitamin D, Ergocalciferol, (DRISDOL) 1.25 MG (50000 UNIT) CAPS capsule Take 1 capsule (50,000 Units total) by mouth every 7 (seven) days.   No facility-administered encounter medications on file as of 12/03/2021.    ALLERGIES: Allergies  Allergen Reactions   Penicillins Anaphylaxis, Hives and Other (See Comments)    Childhood Reaction. Has patient had a PCN reaction causing immediate rash,  facial/tongue/throat swelling, SOB or lightheadedness with hypotension: Yes Has patient had a PCN reaction causing severe rash involving mucus membranes or skin necrosis: No Has patient had a PCN reaction that required hospitalization: No Has patient had a PCN reaction occurring within the last 10 years: No If all of the above answers are "NO", then may proceed with Cephalosporin use.    Niaspan [Niacin] Other (See Comments)    Patient passes out.    VACCINATION STATUS: Immunization History  Administered Date(s) Administered   Influenza,inj,Quad PF,6+ Mos 11/28/2012, 01/22/2014, 12/26/2014, 12/07/2015, 12/15/2016, 01/02/2018, 01/07/2019, 01/23/2020, 01/29/2021   PFIZER(Purple Top)SARS-COV-2 Vaccination 06/29/2019, 07/20/2019, 01/23/2020   Pneumococcal Polysaccharide-23 01/07/2019   Tdap 03/19/2013   Diabetes She presents for her follow-up diabetic visit. She has type 2 diabetes mellitus. Onset time: Diagnosed at approx age of 54. Her disease course has been stable. Pertinent negatives for hypoglycemia include no nervousness/anxiousness or tremors. There are no diabetic associated symptoms. Pertinent negatives for diabetes include no fatigue and no weight loss. There are no hypoglycemic complications. Symptoms are stable. Diabetic complications include nephropathy. Risk factors for coronary artery disease include diabetes mellitus, dyslipidemia, hypertension, obesity, sedentary lifestyle and tobacco exposure. Current diabetic treatment includes oral agent (monotherapy) (Trulicity 4.5 SQ weekly). She is compliant with treatment all of the time. Her weight is fluctuating minimally. She is following a generally healthy diet. When asked about meal planning, she reported none. She has not had a previous visit with a dietitian. She rarely participates in exercise. Her home blood glucose trend is fluctuating minimally. Her breakfast blood glucose range is generally 130-140 mg/dl. (She presents today,  accompanied by her mother, with her logs, no meter, showing slightly above target fasting glycemic profile.  Her previsit A1c on 8/11 was 7.7%, improving slightly from last visit of 8.5%.  She is frustrated that her fasting readings arent at goal as she started exercising more.  She denies any hypoglycemia.) An ACE inhibitor/angiotensin II receptor blocker is not being taken. She does not see a podiatrist.Eye exam is current.  Thyroid Problem Presents for follow-up visit. Symptoms include weight gain. Patient reports no anxiety, cold intolerance, constipation, depressed mood, diarrhea, fatigue, heat intolerance, leg swelling, palpitations, tremors or weight loss. The symptoms have been stable. Her past medical history is significant for diabetes and hyperlipidemia.  Hyperlipidemia This is a chronic problem. The current episode started more than 1 year ago. The problem is uncontrolled. Recent lipid tests were reviewed and are variable. Exacerbating diseases include chronic renal disease, diabetes, hypothyroidism and obesity. Factors aggravating her hyperlipidemia include fatty foods. Current antihyperlipidemic treatment includes statins and fibric acid derivatives. The current treatment provides moderate improvement of lipids. There are no compliance problems.  Risk  factors for coronary artery disease include diabetes mellitus, dyslipidemia, hypertension, obesity and a sedentary lifestyle.   Review of systems  Constitutional: + steadily decreasing body weight,  current Body mass index is 35.98 kg/m. , no fatigue, no subjective hyperthermia, no subjective hypothermia Eyes: no blurry vision, no xerophthalmia ENT: no sore throat, no nodules palpated in throat, no dysphagia/odynophagia, no hoarseness Cardiovascular: no chest pain, no shortness of breath, no palpitations, no leg swelling Respiratory: no cough, no shortness of breath Gastrointestinal: no nausea/vomiting/diarrhea Musculoskeletal: no  muscle/joint aches Skin: no rashes, no hyperemia Neurological: no tremors, no numbness, no tingling, no dizziness Psychiatric: no depression, no anxiety   Objective:    BP 126/86 (BP Location: Left Arm, Patient Position: Sitting, Cuff Size: Large)   Pulse 71   Ht 5' 1"  (1.549 m)   Wt 190 lb 6.4 oz (86.4 kg)   BMI 35.98 kg/m   Wt Readings from Last 3 Encounters:  12/03/21 190 lb 6.4 oz (86.4 kg)  10/29/21 189 lb (85.7 kg)  08/30/21 196 lb 6.4 oz (89.1 kg)    BP Readings from Last 3 Encounters:  12/03/21 126/86  10/29/21 126/89  07/30/21 118/86     Physical Exam- Limited  Constitutional:  Body mass index is 35.98 kg/m. , not in acute distress, normal state of mind Eyes:  EOMI, no exophthalmos Neck: Supple Cardiovascular: RRR, no murmurs, rubs, or gallops, no edema Respiratory: Adequate breathing efforts, no crackles, rales, rhonchi, or wheezing Musculoskeletal: no gross deformities, strength intact in all four extremities, no gross restriction of joint movements Skin:  no rashes, no hyperemia Neurological: no tremor with outstretched hands    CMP     Component Value Date/Time   NA 140 07/30/2021 0927   K 4.7 07/30/2021 0927   CL 105 07/30/2021 0927   CO2 21 07/30/2021 0927   GLUCOSE 167 (H) 07/30/2021 0927   GLUCOSE 438 (H) 06/06/2017 0811   BUN 16 07/30/2021 0927   CREATININE 1.14 (H) 07/30/2021 0927   CREATININE 1.13 (H) 08/17/2012 1031   CALCIUM 9.3 07/30/2021 0927   PROT 5.8 (L) 07/30/2021 0927   ALBUMIN 3.6 (L) 07/30/2021 0927   AST 24 07/30/2021 0927   ALT 34 (H) 07/30/2021 0927   ALKPHOS 123 (H) 07/30/2021 0927   BILITOT 0.5 07/30/2021 0927   GFRNONAA 50 (L) 01/23/2020 1126   GFRNONAA 61 08/17/2012 1031   GFRAA 58 (L) 01/23/2020 1126   GFRAA 70 08/17/2012 1031     Diabetic Labs (most recent): Lab Results  Component Value Date   HGBA1C 7.7 (H) 10/29/2021   HGBA1C 8.5 (H) 07/30/2021   HGBA1C 9.8 (A) 05/28/2021   MICROALBUR 30 mg/L 12/03/2021    MICROALBUR 150 10/09/2020   MICROALBUR 20 11/14/2014     Lipid Panel ( most recent) Lipid Panel     Component Value Date/Time   CHOL 92 (L) 07/30/2021 0927   CHOL 85 08/17/2012 1031   TRIG 258 (H) 07/30/2021 0927   TRIG 161 (H) 08/17/2012 1031   HDL 26 (L) 07/30/2021 0927   HDL 26 (L) 08/17/2012 1031   CHOLHDL 3.5 07/30/2021 0927   CHOLHDL 10.3 11/29/2012 1030   VLDL 39 11/29/2012 1030   LDLCALC 27 07/30/2021 0927   LDLCALC 27 08/17/2012 1031     Lab Results  Component Value Date   TSH 1.160 07/30/2021   TSH 1.010 01/04/2021   TSH 0.671 06/29/2020   TSH 1.080 01/23/2020   TSH 1.320 07/26/2019   TSH 0.619 04/22/2019  TSH 0.477 01/25/2019   TSH 0.854 10/15/2018   TSH 1.130 04/16/2018   TSH 0.346 (L) 01/02/2018   FREET4 1.77 07/30/2021   FREET4 1.57 01/04/2021   FREET4 2.22 (H) 06/29/2020   FREET4 1.38 01/23/2020   FREET4 1.91 (H) 04/22/2019   FREET4 1.69 10/15/2018   FREET4 1.43 04/16/2018   FREET4 1.68 01/02/2018   FREET4 1.52 09/04/2017   FREET4 1.19 11/14/2014      Assessment & Plan:   1) Type 2 diabetes mellitus with stage 3 chronic kidney disease, without long-term current use of insulin ([place])  - Diane Morgan has currently controlled type 2 DM since 51 years of age.  She presents today, accompanied by her mother, with her logs, no meter, showing slightly above target fasting glycemic profile.  Her previsit A1c on 8/11 was 7.7%, improving slightly from last visit of 8.5%.  She is frustrated that her fasting readings arent at goal as she started exercising more.  She denies any hypoglycemia.  -her diabetes is complicated by obesity/sedentary life, CKD, smoking, and Diane Morgan remains at a high risk for more acute and chronic complications which include CAD, CVA, CKD, retinopathy, and neuropathy. These are all discussed in detail with the patient.  - Nutritional counseling repeated at each appointment due to patients tendency to fall back in to old  habits.  - The patient admits there is a room for improvement in their diet and drink choices. -  Suggestion is made for the patient to avoid simple carbohydrates from their diet including Cakes, Sweet Desserts / Pastries, Ice Cream, Soda (diet and regular), Sweet Tea, Candies, Chips, Cookies, Sweet Pastries, Store Bought Juices, Alcohol in Excess of 1-2 drinks a day, Artificial Sweeteners, Coffee Creamer, and "Sugar-free" Products. This will help patient to have stable blood glucose profile and potentially avoid unintended weight gain.   - I encouraged the patient to switch to unprocessed or minimally processed complex starch and increased protein intake (animal or plant source), fruits, and vegetables.   - Patient is advised to stick to a routine mealtimes to eat 3 meals a day and avoid unnecessary snacks (to snack only to correct hypoglycemia).  - I have approached her with the following individualized plan to manage diabetes and patient agrees:   -She is advised to continue Trulicity 4.5 mg SQ weekly and Glipizide 5 mg XL daily with breakfast.    -She is advised to continue monitoring blood glucose at least once daily, before breakfast and to call the clinic if she has readings less than 70 or above 200 for 3 tests in a row.  - Patient specific target  A1c;  LDL, HDL, Triglycerides  were discussed in detail.  2) BP/HTN:  -Her blood pressure is controlled to target without the use of antihypertensive medications.  3) Lipids/HPL:    Her most recent lipid panel from 07/30/21 shows controlled LDL of 27 and elevated triglycerides of 258.  She is advised to continue Atorvastatin 40 mg po daily at bedtime.  Side effects and precautions discussed with her.  She is advised to avoid fried foods and butter.  4)  Weight/Diet:  Her Body mass index is 35.98 kg/m.-she is a candidate for modest weight loss.  CDE Consult has been  initiated , exercise, and detailed carbohydrates information  provided.  5) Hypothyroidism: -Diagnosed at approximate age of 18 years.   There are no recent TFTs to review.  She is advised to continue Levothyroxine 75 mcg po daily before  breakfast.  Will recheck TFTs prior to next visit.   - We discussed about the correct intake of her thyroid hormone, on empty stomach at fasting, with water, separated by at least 30 minutes from breakfast and other medications,  and separated by more than 4 hours from calcium, iron, multivitamins, acid reflux medications (PPIs). -Patient is made aware of the fact that thyroid hormone replacement is needed for life, dose to be adjusted by periodic monitoring of thyroid function tests.  6) Chronic Care/Health Maintenance: -she is on statin medications and is encouraged to continue to follow up with Ophthalmology, Dentist, Podiatrist at least yearly or according to recommendations, and advised to away from smoking.    I have recommended yearly flu vaccine and pneumonia vaccination at least every 5 years; moderate intensity exercise for up to 150 minutes weekly; and  sleep for at least 7 hours a day.  7) Vitamin D deficiency Her most recent vitamin  D level was 19.2 on 07/30/21.  I discussed and reinitiated Ergocalciferol 50000 units weekly.  She can also take the OTC Vitamin D3 5000 units daily to help boost her numbers.  - I advised patient to maintain close follow up with Dettinger, Fransisca Kaufmann, MD for primary care needs.     I spent 42 minutes in the care of the patient today including review of labs from [facility], Lipids, Thyroid Function, Hematology (current and previous including abstractions from other facilities); face-to-face time discussing  her blood glucose readings/logs, discussing hypoglycemia and hyperglycemia episodes and symptoms, medications doses, her options of short and long term treatment based on the latest standards of care / guidelines;  discussion about incorporating lifestyle medicine;  and documenting  the encounter. Risk reduction counseling performed per USPSTF guidelines to reduce obesity and cardiovascular risk factors.     Please refer to Patient Instructions for Blood Glucose Monitoring and Insulin/Medications Dosing Guide"  in media tab for additional information. Please  also refer to " Patient Self Inventory" in the Media  tab for reviewed elements of pertinent patient history.  Diane Morgan participated in the discussions, expressed understanding, and voiced agreement with the above plans.  All questions were answered to her satisfaction. she is encouraged to contact clinic should she have any questions or concerns prior to her return visit.   Follow up plan: - Return in about 3 months (around 03/04/2022) for Diabetes F/U with A1c in office, Thyroid follow up, Previsit labs, Bring meter and logs.  Rayetta Pigg, [facility] [address] [place], [place] [address] Phone: [phone] Fax: [phone]  12/03/2021, 10:54 AM

## 2021-12-07 ENCOUNTER — Other Ambulatory Visit: Payer: Self-pay | Admitting: Nurse Practitioner

## 2022-02-02 ENCOUNTER — Ambulatory Visit: Payer: Medicaid Other | Admitting: Family Medicine

## 2022-02-02 ENCOUNTER — Encounter: Payer: Self-pay | Admitting: Family Medicine

## 2022-02-02 VITALS — BP 119/78 | HR 82 | Temp 98.3°F | Ht 61.0 in | Wt 189.0 lb

## 2022-02-02 DIAGNOSIS — E559 Vitamin D deficiency, unspecified: Secondary | ICD-10-CM | POA: Diagnosis not present

## 2022-02-02 DIAGNOSIS — Z23 Encounter for immunization: Secondary | ICD-10-CM | POA: Diagnosis not present

## 2022-02-02 DIAGNOSIS — N1832 Chronic kidney disease, stage 3b: Secondary | ICD-10-CM | POA: Diagnosis not present

## 2022-02-02 DIAGNOSIS — E039 Hypothyroidism, unspecified: Secondary | ICD-10-CM | POA: Diagnosis not present

## 2022-02-02 DIAGNOSIS — E781 Pure hyperglyceridemia: Secondary | ICD-10-CM

## 2022-02-02 DIAGNOSIS — E1122 Type 2 diabetes mellitus with diabetic chronic kidney disease: Secondary | ICD-10-CM

## 2022-02-02 DIAGNOSIS — E782 Mixed hyperlipidemia: Secondary | ICD-10-CM | POA: Diagnosis not present

## 2022-02-02 DIAGNOSIS — I10 Essential (primary) hypertension: Secondary | ICD-10-CM | POA: Diagnosis not present

## 2022-02-02 DIAGNOSIS — E1142 Type 2 diabetes mellitus with diabetic polyneuropathy: Secondary | ICD-10-CM | POA: Diagnosis not present

## 2022-02-02 LAB — BAYER DCA HB A1C WAIVED: HB A1C (BAYER DCA - WAIVED): 7.8 % — ABNORMAL HIGH (ref 4.8–5.6)

## 2022-02-02 NOTE — Progress Notes (Signed)
BP 119/78   Pulse 82   Temp 98.3 F (36.8 C)   Ht _0  (1.549 m)   Wt 189 lb (85.7 kg)   SpO2 97%   BMI 35.71 kg/m    Subjective:   Patient ID: Diane Morgan, female    DOB: 1970-08-24, 51 y.o.   MRN: [id number]  HPI: Diane Morgan is a 51 y.o. female presenting on 02/02/2022 for Medical Management of Chronic Issues, Hyperlipidemia, Hypertension, Hypothyroidism, and Diabetes   HPI Type 2 diabetes mellitus Patient comes in today for recheck of his diabetes. Patient has been currently taking glipizide and Trulicity. Patient is not currently on an ACE inhibitor/ARB. Patient has not seen an ophthalmologist this year. Patient denies any new issues with their feet. The symptom started onset as an adult neuropathy and CKD and hyperlipidemia and hypertriglyceridemia ARE RELATED TO DM   Hypertension Patient is currently on no medicine, and their blood pressure today is 119/78. Patient denies any lightheadedness or dizziness. Patient denies headaches, blurred vision, chest pains, shortness of breath, or weakness. Denies any side effects from medication and is content with current medication.   Hypothyroidism recheck Patient is coming in for thyroid recheck today as well. They deny any issues with hair changes or heat or cold problems or diarrhea or constipation. They deny any chest pain or palpitations. They are currently on levothyroxine 75 micrograms   Hyperlipidemia and hypertriglyceridemia Patient is coming in for recheck of his hyperlipidemia. The patient is currently taking atorvastatin and fenofibrate. They deny any issues with myalgias or history of liver damage from it. They deny any focal numbness or weakness or chest pain.   Relevant past medical, surgical, family and social history reviewed and updated as indicated. Interim medical history since our last visit reviewed. Allergies and medications reviewed and updated.  Review of Systems  Constitutional:  Negative for chills  and fever.  Eyes:  Negative for visual disturbance.  Respiratory:  Negative for chest tightness and shortness of breath.   Cardiovascular:  Negative for chest pain and leg swelling.  Musculoskeletal:  Negative for back pain and gait problem.  Skin:  Negative for rash.  Neurological:  Negative for dizziness, light-headedness and headaches.  Psychiatric/Behavioral:  Negative for agitation and behavioral problems.   All other systems reviewed and are negative.   Per HPI unless specifically indicated above   Allergies as of 02/02/2022       Reactions   Penicillins Anaphylaxis, Hives, Other (See Comments)   Childhood Reaction. Has patient had a PCN reaction causing immediate rash, facial/tongue/throat swelling, SOB or lightheadedness with hypotension: Yes Has patient had a PCN reaction causing severe rash involving mucus membranes or skin necrosis: No Has patient had a PCN reaction that required hospitalization: No Has patient had a PCN reaction occurring within the last 10 years: No If all of the above answers are "NO", then may proceed with Cephalosporin use.   Niaspan [niacin] Other (See Comments)   Patient passes out.        Medication List        Accurate as of February 02, 2022 11:58 AM. If you have any questions, ask your nurse or doctor.          Accu-Chek Aviva Plus w/Device Kit USE AS DIRECTED   Accu-Chek Guide test strip Generic drug: glucose blood USE 1 STRIP TO CHECK GLUCOSE THREE TIMES DAILY AS DIRECTED Dx E11.65   aspirin 81 MG chewable tablet Chew 81 mg by mouth  daily.   atorvastatin 40 MG tablet Commonly known as: LIPITOR Take 1 tablet (40 mg total) by mouth daily.   cetirizine 10 MG tablet Commonly known as: ZYRTEC Take 10 mg by mouth at bedtime.   cycloSPORINE 0.05 % ophthalmic emulsion Commonly known as: RESTASIS 1 drop 2 (two) times daily.   fenofibrate 145 MG tablet Commonly known as: TRICOR Take 1 tablet (145 mg total) by mouth  daily.   gabapentin 300 MG capsule Commonly known as: NEURONTIN Take 1 capsule (300 mg total) by mouth at bedtime.   glipiZIDE 5 MG 24 hr tablet Commonly known as: GLUCOTROL XL Take 1 tablet (5 mg total) by mouth daily with breakfast.   ibuprofen 200 MG tablet Commonly known as: ADVIL Take 400 mg by mouth every 6 (six) hours as needed for headache or moderate pain.   levothyroxine 75 MCG tablet Commonly known as: SYNTHROID TAKE 1 TABLET BY MOUTH ONCE DAILY BEFORE BREAKFAST   Trulicity 4.5 XM/4.6OE Sopn Generic drug: Dulaglutide INJECT 4 & 1/2 (FOUR & ONE-HALF) MG SUBCUTANEOUSLY ONCE A WEEK   Vitamin D (Ergocalciferol) 1.25 MG (50000 UNIT) Caps capsule Commonly known as: DRISDOL Take 1 capsule (50,000 Units total) by mouth every 7 (seven) days.         Objective:   BP 119/78   Pulse 82   Temp 98.3 F (36.8 C)   Ht _0  (1.549 m)   Wt 189 lb (85.7 kg)   SpO2 97%   BMI 35.71 kg/m   Wt Readings from Last 3 Encounters:  02/02/22 189 lb (85.7 kg)  12/03/21 190 lb 6.4 oz (86.4 kg)  10/29/21 189 lb (85.7 kg)    Physical Exam Vitals and nursing note reviewed.  Constitutional:      General: She is not in acute distress.    Appearance: She is well-developed. She is not diaphoretic.  Eyes:     Conjunctiva/sclera: Conjunctivae normal.  Cardiovascular:     Rate and Rhythm: Normal rate and regular rhythm.     Heart sounds: Normal heart sounds. No murmur heard. Pulmonary:     Effort: Pulmonary effort is normal. No respiratory distress.     Breath sounds: Normal breath sounds. No wheezing.  Musculoskeletal:        General: No swelling or tenderness. Normal range of motion.  Skin:    General: Skin is warm and dry.     Findings: No rash.  Neurological:     Mental Status: She is alert and oriented to person, place, and time.     Coordination: Coordination normal.  Psychiatric:        Behavior: Behavior normal.       Assessment & Plan:   Problem List Items  Addressed This Visit       Cardiovascular and Mediastinum   Hypertension   Relevant Orders   CBC with Differential/Platelet   CMP14+EGFR   Lipid panel   VITAMIN D 25 Hydroxy (Vit-D Deficiency, Fractures)   Bayer DCA Hb A1c Waived (Completed)   TSH     Endocrine   Type 2 diabetes mellitus with stage 3 chronic kidney disease, without long-term current use of insulin (HCC) - Primary   Relevant Orders   CBC with Differential/Platelet   CMP14+EGFR   Lipid panel   VITAMIN D 25 Hydroxy (Vit-D Deficiency, Fractures)   Bayer DCA Hb A1c Waived (Completed)   TSH   Hypothyroidism   Relevant Orders   CBC with Differential/Platelet   CMP14+EGFR   Lipid panel  VITAMIN D 25 Hydroxy (Vit-D Deficiency, Fractures)   Bayer DCA Hb A1c Waived (Completed)   TSH   T4, Free   Type 2 diabetes mellitus with diabetic polyneuropathy (HCC)     Other   Hyperlipidemia   Relevant Orders   CBC with Differential/Platelet   CMP14+EGFR   Lipid panel   VITAMIN D 25 Hydroxy (Vit-D Deficiency, Fractures)   Bayer DCA Hb A1c Waived (Completed)   TSH   Hypertriglyceridemia   Other Visit Diagnoses     Vitamin D deficiency       Relevant Orders   CBC with Differential/Platelet   CMP14+EGFR   Lipid panel   VITAMIN D 25 Hydroxy (Vit-D Deficiency, Fractures)   Bayer DCA Hb A1c Waived (Completed)   TSH       Sees endocrinologist for diabetes.  Continue current medicine, continue to follow with them.  A1c was 7.8 which is slightly up.  Continue to follow with endocrinology Follow up plan: Return in about 6 months (around 08/03/2022), or if symptoms worsen or fail to improve, for Hypertension and diabetes and hypothyroidism and hyperlipidemia.  Counseling provided for all of the vaccine components Orders Placed This Encounter  Procedures   CBC with Differential/Platelet   CMP14+EGFR   Lipid panel   VITAMIN D 25 Hydroxy (Vit-D Deficiency, Fractures)   Bayer DCA Hb A1c Waived   TSH   T4, Free     Caryl Pina, MD [facility] 02/02/2022, 11:58 AM

## 2022-02-02 NOTE — Addendum Note (Signed)
Addended by: Dorene Sorrow on: 02/02/2022 12:11 PM   Modules accepted: Orders

## 2022-02-03 LAB — CBC WITH DIFFERENTIAL/PLATELET
Basophils Absolute: 0.1 10*3/uL (ref 0.0–0.2)
Basos: 1 %
EOS (ABSOLUTE): 0.4 10*3/uL (ref 0.0–0.4)
Eos: 5 %
Hematocrit: 44.1 % (ref 34.0–46.6)
Hemoglobin: 14.7 g/dL (ref 11.1–15.9)
Immature Grans (Abs): 0.1 10*3/uL (ref 0.0–0.1)
Immature Granulocytes: 1 %
Lymphocytes Absolute: 2.5 10*3/uL (ref 0.7–3.1)
Lymphs: 27 %
MCH: 29.8 pg (ref 26.6–33.0)
MCHC: 33.3 g/dL (ref 31.5–35.7)
MCV: 89 fL (ref 79–97)
Monocytes Absolute: 0.7 10*3/uL (ref 0.1–0.9)
Monocytes: 7 %
Neutrophils Absolute: 5.6 10*3/uL (ref 1.4–7.0)
Neutrophils: 59 %
Platelets: 224 10*3/uL (ref 150–450)
RBC: 4.94 x10E6/uL (ref 3.77–5.28)
RDW: 13.7 % (ref 11.7–15.4)
WBC: 9.4 10*3/uL (ref 3.4–10.8)

## 2022-02-03 LAB — T4, FREE: Free T4: 1.52 ng/dL (ref 0.82–1.77)

## 2022-02-03 LAB — CMP14+EGFR
ALT: 28 IU/L (ref 0–32)
AST: 17 IU/L (ref 0–40)
Albumin/Globulin Ratio: 1.8 (ref 1.2–2.2)
Albumin: 3.8 g/dL — ABNORMAL LOW (ref 3.9–4.9)
Alkaline Phosphatase: 154 IU/L — ABNORMAL HIGH (ref 44–121)
BUN/Creatinine Ratio: 11 (ref 9–23)
BUN: 12 mg/dL (ref 6–24)
Bilirubin Total: 0.7 mg/dL (ref 0.0–1.2)
CO2: 20 mmol/L (ref 20–29)
Calcium: 9.1 mg/dL (ref 8.7–10.2)
Chloride: 106 mmol/L (ref 96–106)
Creatinine, Ser: 1.13 mg/dL — ABNORMAL HIGH (ref 0.57–1.00)
Globulin, Total: 2.1 g/dL (ref 1.5–4.5)
Glucose: 127 mg/dL — ABNORMAL HIGH (ref 70–99)
Potassium: 4.7 mmol/L (ref 3.5–5.2)
Sodium: 140 mmol/L (ref 134–144)
Total Protein: 5.9 g/dL — ABNORMAL LOW (ref 6.0–8.5)
eGFR: 60 mL/min/{1.73_m2} (ref >59)

## 2022-02-03 LAB — TSH: TSH: 0.871 u[IU]/mL (ref 0.450–4.500)

## 2022-02-03 LAB — LIPID PANEL
Chol/HDL Ratio: 3.4 ratio (ref 0.0–4.4)
Cholesterol, Total: 99 mg/dL — ABNORMAL LOW (ref 100–199)
HDL: 29 mg/dL — ABNORMAL LOW (ref >39)
LDL Chol Calc (NIH): 36 mg/dL (ref 0–99)
Triglycerides: 213 mg/dL — ABNORMAL HIGH (ref 0–149)
VLDL Cholesterol Cal: 34 mg/dL (ref 5–40)

## 2022-02-03 LAB — VITAMIN D 25 HYDROXY (VIT D DEFICIENCY, FRACTURES): Vit D, 25-Hydroxy: 36.1 ng/mL (ref 30.0–100.0)

## 2022-02-13 ENCOUNTER — Other Ambulatory Visit: Payer: Self-pay | Admitting: Nurse Practitioner

## 2022-03-03 NOTE — Patient Instructions (Signed)

## 2022-03-04 ENCOUNTER — Ambulatory Visit: Payer: Medicaid Other | Admitting: Nurse Practitioner

## 2022-03-07 ENCOUNTER — Ambulatory Visit: Payer: Medicaid Other | Admitting: Nurse Practitioner

## 2022-03-07 ENCOUNTER — Encounter: Payer: Self-pay | Admitting: Nurse Practitioner

## 2022-03-07 VITALS — BP 115/79 | HR 77 | Ht 61.0 in | Wt 187.0 lb

## 2022-03-07 DIAGNOSIS — E039 Hypothyroidism, unspecified: Secondary | ICD-10-CM | POA: Diagnosis not present

## 2022-03-07 DIAGNOSIS — E559 Vitamin D deficiency, unspecified: Secondary | ICD-10-CM | POA: Diagnosis not present

## 2022-03-07 DIAGNOSIS — E785 Hyperlipidemia, unspecified: Secondary | ICD-10-CM | POA: Diagnosis not present

## 2022-03-07 DIAGNOSIS — E1165 Type 2 diabetes mellitus with hyperglycemia: Secondary | ICD-10-CM | POA: Diagnosis not present

## 2022-03-07 DIAGNOSIS — E782 Mixed hyperlipidemia: Secondary | ICD-10-CM

## 2022-03-07 DIAGNOSIS — I10 Essential (primary) hypertension: Secondary | ICD-10-CM | POA: Diagnosis not present

## 2022-03-07 MED ORDER — TRULICITY 4.5 MG/0.5ML ~~LOC~~ SOAJ: 4.5000 mg | SUBCUTANEOUS | 1 refills | Status: DC

## 2022-03-07 MED ORDER — GLIPIZIDE ER 5 MG PO TB24: 5.0000 mg | ORAL_TABLET | Freq: Every day | ORAL | 3 refills | Status: DC

## 2022-03-07 MED ORDER — LEVOTHYROXINE SODIUM 75 MCG PO TABS: 75.0000 ug | ORAL_TABLET | Freq: Every day | ORAL | 3 refills | Status: DC

## 2022-03-07 MED ORDER — VITAMIN D (ERGOCALCIFEROL) 1.25 MG (50000 UNIT) PO CAPS: 50000.0000 [IU] | ORAL_CAPSULE | ORAL | 0 refills | Status: DC

## 2022-03-07 NOTE — Progress Notes (Signed)
03/07/2022, 10:04 AM        Endocrinology follow-up note    Subjective:    Patient ID: Diane Morgan, female    DOB: 21-Mar-1971.  Diane Morgan is being iseen in follow-up for management of currently controlled symptomatic type 2 diabetes, hypothyroidism, hyperlipidemia, hypertension. PMD:  Dettinger, Fransisca Kaufmann, MD.    Past Medical History:  Diagnosis Date   Anemia    Arthritis    Cataract    right   Cellulitis and abscess of trunk 11/28/2012   Diabetic peripheral neuropathy (HCC)    DM (diabetes mellitus) ([place])    Eczema    GERD (gastroesophageal reflux disease)    Hyperlipidemia    Hypertension    Hypothyroidism    Leg pain, bilateral 10/08/09   NIDDM (non-insulin dependent diabetes mellitus)    URI (upper respiratory infection) 07/01/10   Past Surgical History:  Procedure Laterality Date   CATARACT EXTRACTION W/PHACO Right 06/12/2017   Procedure: CATARACT EXTRACTION WITH PHACOEMULSIFICATION  AND INTRAOCULAR LENS PLACEMENT RIGHT EYE;  Surgeon: Tonny Branch, MD;  Location: [facility];  Service: Ophthalmology;  Laterality: Right;  CDE: 1.16   CHOLECYSTECTOMY     EYE SURGERY     right catracts   EYE SURGERY  12/2019   left cataract   LASER ABLATION CONDOLAMATA N/A 03/08/2017   Procedure: LASER ABLATION OF THE CERVIX;  Surgeon: Florian Buff, MD;  Location: [facility];  Service: Gynecology;  Laterality: N/A;   WISDOM TOOTH EXTRACTION     Social History   Socioeconomic History   Marital status: Single    Spouse name: Not on file   Number of children: Not on file   Years of education: Not on file   Highest education level: Not on file  Occupational History   Not on file  Tobacco Use   Smoking status: Every Day    Years: 1.00    Types: Cigarettes   Smokeless tobacco: Never   Tobacco comments:    smokes 4 cig daily  Vaping Use   Vaping Use: Never used  Substance and Sexual Activity   Alcohol use: No   Drug use: No   Sexual activity: Not  Currently    Birth control/protection: None  Other Topics Concern   Not on file  Social History Narrative   Not on file   Social Determinants of Health   Financial Resource Strain: Not on file  Food Insecurity: Not on file  Transportation Needs: Not on file  Physical Activity: Not on file  Stress: Not on file  Social Connections: Not on file   Outpatient Encounter Medications as of 03/07/2022  Medication Sig   aspirin 81 MG chewable tablet Chew 81 mg by mouth daily.   atorvastatin (LIPITOR) 40 MG tablet Take 1 tablet (40 mg total) by mouth daily.   Blood Glucose Monitoring Suppl (ACCU-CHEK AVIVA PLUS) w/Device KIT USE AS DIRECTED   cetirizine (ZYRTEC) 10 MG tablet Take 10 mg by mouth at bedtime.    cycloSPORINE (RESTASIS) 0.05 % ophthalmic emulsion 1 drop 2 (two) times daily.   fenofibrate (TRICOR) 145 MG tablet Take 1 tablet (145 mg total) by mouth daily.   gabapentin (NEURONTIN) 300 MG capsule Take 1 capsule (300 mg total) by mouth at bedtime.   glucose blood (ACCU-CHEK GUIDE) test strip USE 1 STRIP TO CHECK GLUCOSE THREE TIMES DAILY AS DIRECTED Dx E11.65   ibuprofen (ADVIL,MOTRIN) 200 MG tablet Take 400 mg by  mouth every 6 (six) hours as needed for headache or moderate pain.   [DISCONTINUED] glipiZIDE (GLUCOTROL XL) 5 MG 24 hr tablet Take 1 tablet (5 mg total) by mouth daily with breakfast.   [DISCONTINUED] levothyroxine (SYNTHROID) 75 MCG tablet TAKE 1 TABLET BY MOUTH ONCE DAILY BEFORE BREAKFAST   [DISCONTINUED] TRULICITY 4.5 CX/4.4YJ SOPN INJECT 4.5MG SUBCUTANEOUSLY ONCE A WEEK   [DISCONTINUED] Vitamin D, Ergocalciferol, (DRISDOL) 1.25 MG (50000 UNIT) CAPS capsule Take 1 capsule (50,000 Units total) by mouth every 7 (seven) days.   Dulaglutide (TRULICITY) 4.5 EH/6.3JS SOPN Inject 4.5 mg into the skin once a week.   glipiZIDE (GLUCOTROL XL) 5 MG 24 hr tablet Take 1 tablet (5 mg total) by mouth daily with breakfast.   levothyroxine (SYNTHROID) 75 MCG tablet Take 1 tablet (75 mcg  total) by mouth daily before breakfast.   Vitamin D, Ergocalciferol, (DRISDOL) 1.25 MG (50000 UNIT) CAPS capsule Take 1 capsule (50,000 Units total) by mouth every 7 (seven) days.   No facility-administered encounter medications on file as of 03/07/2022.    ALLERGIES: Allergies  Allergen Reactions   Penicillins Anaphylaxis, Hives and Other (See Comments)    Childhood Reaction. Has patient had a PCN reaction causing immediate rash, facial/tongue/throat swelling, SOB or lightheadedness with hypotension: Yes Has patient had a PCN reaction causing severe rash involving mucus membranes or skin necrosis: No Has patient had a PCN reaction that required hospitalization: No Has patient had a PCN reaction occurring within the last 10 years: No If all of the above answers are "NO", then may proceed with Cephalosporin use.    Niaspan [Niacin] Other (See Comments)    Patient passes out.    VACCINATION STATUS: Immunization History  Administered Date(s) Administered   Influenza,inj,Quad PF,6+ Mos 11/28/2012, 01/22/2014, 12/26/2014, 12/07/2015, 12/15/2016, 01/02/2018, 01/07/2019, 01/23/2020, 01/29/2021, 02/02/2022   PFIZER(Purple Top)SARS-COV-2 Vaccination 06/29/2019, 07/20/2019, 01/23/2020   PNEUMOCOCCAL CONJUGATE-20 02/02/2022   Pneumococcal Polysaccharide-23 01/07/2019   Tdap 03/19/2013   Diabetes She presents for her follow-up diabetic visit. She has type 2 diabetes mellitus. Onset time: Diagnosed at approx age of 52. Her disease course has been stable. Pertinent negatives for hypoglycemia include no nervousness/anxiousness or tremors. There are no diabetic associated symptoms. Pertinent negatives for diabetes include no fatigue and no weight loss. There are no hypoglycemic complications. Symptoms are stable. Diabetic complications include nephropathy. Risk factors for coronary artery disease include diabetes mellitus, dyslipidemia, hypertension, obesity, sedentary lifestyle and tobacco exposure.  Current diabetic treatment includes oral agent (monotherapy) (Trulicity 4.5 SQ weekly). She is compliant with treatment all of the time. Her weight is decreasing steadily. She is following a generally healthy diet. When asked about meal planning, she reported none. She has not had a previous visit with a dietitian. She rarely participates in exercise. Her home blood glucose trend is fluctuating minimally. Her breakfast blood glucose range is generally 110-130 mg/dl. Her bedtime blood glucose range is generally 180-200 mg/dl. (She presents today, accompanied by her mother, with her meter and logs showing at goal fasting and slightly above target postprandial readings.  She was not due for another A1c today, was most recently 7.8% on 11/15, essentially unchanged from previous visit.  Analysis of her meter shows 7-day average of 160, 14-day average of 148, 30-day average of 148, 90-day average of 158.  She denies any hypoglycemia.) An ACE inhibitor/angiotensin II receptor blocker is not being taken. She does not see a podiatrist.Eye exam is current.  Thyroid Problem Presents for follow-up visit. Symptoms include weight gain.  Patient reports no anxiety, cold intolerance, constipation, depressed mood, diarrhea, fatigue, heat intolerance, leg swelling, palpitations, tremors or weight loss. The symptoms have been stable. Her past medical history is significant for diabetes and hyperlipidemia.  Hyperlipidemia This is a chronic problem. The current episode started more than 1 year ago. The problem is uncontrolled. Recent lipid tests were reviewed and are variable. Exacerbating diseases include chronic renal disease, diabetes, hypothyroidism and obesity. Factors aggravating her hyperlipidemia include fatty foods. Current antihyperlipidemic treatment includes statins and fibric acid derivatives. The current treatment provides moderate improvement of lipids. There are no compliance problems.  Risk factors for coronary  artery disease include diabetes mellitus, dyslipidemia, hypertension, obesity and a sedentary lifestyle.   Review of systems  Constitutional: + steadily decreasing body weight,  current Body mass index is 35.33 kg/m. , no fatigue, no subjective hyperthermia, no subjective hypothermia Eyes: no blurry vision, no xerophthalmia ENT: no sore throat, no nodules palpated in throat, no dysphagia/odynophagia, no hoarseness Cardiovascular: no chest pain, no shortness of breath, no palpitations, no leg swelling Respiratory: no cough, no shortness of breath Gastrointestinal: no nausea/vomiting/diarrhea Musculoskeletal: no muscle/joint aches Skin: no rashes, no hyperemia Neurological: no tremors, no numbness, no tingling, no dizziness Psychiatric: no depression, no anxiety   Objective:    BP 115/79 (BP Location: Left Arm, Patient Position: Sitting, Cuff Size: Normal)   Pulse 77   Ht _0  (1.549 m)   Wt 187 lb (84.8 kg)   BMI 35.33 kg/m   Wt Readings from Last 3 Encounters:  03/07/22 187 lb (84.8 kg)  02/02/22 189 lb (85.7 kg)  12/03/21 190 lb 6.4 oz (86.4 kg)    BP Readings from Last 3 Encounters:  03/07/22 115/79  02/02/22 119/78  12/03/21 126/86     Physical Exam- Limited  Constitutional:  Body mass index is 35.33 kg/m. , not in acute distress, normal state of mind Eyes:  EOMI, no exophthalmos Musculoskeletal: no gross deformities, strength intact in all four extremities, no gross restriction of joint movements Skin:  no rashes, no hyperemia Neurological: no tremor with outstretched hands    CMP     Component Value Date/Time   NA 140 02/02/2022 1107   K 4.7 02/02/2022 1107   CL 106 02/02/2022 1107   CO2 20 02/02/2022 1107   GLUCOSE 127 (H) 02/02/2022 1107   GLUCOSE 438 (H) 06/06/2017 0811   BUN 12 02/02/2022 1107   CREATININE 1.13 (H) 02/02/2022 1107   CREATININE 1.13 (H) 08/17/2012 1031   CALCIUM 9.1 02/02/2022 1107   PROT 5.9 (L) 02/02/2022 1107   ALBUMIN 3.8 (L)  02/02/2022 1107   AST 17 02/02/2022 1107   ALT 28 02/02/2022 1107   ALKPHOS 154 (H) 02/02/2022 1107   BILITOT 0.7 02/02/2022 1107   GFRNONAA 50 (L) 01/23/2020 1126   GFRNONAA 61 08/17/2012 1031   GFRAA 58 (L) 01/23/2020 1126   GFRAA 70 08/17/2012 1031     Diabetic Labs (most recent): Lab Results  Component Value Date   HGBA1C 7.8 (H) 02/02/2022   HGBA1C 7.7 (H) 10/29/2021   HGBA1C 8.5 (H) 07/30/2021   MICROALBUR 30 mg/L 12/03/2021   MICROALBUR 150 10/09/2020   MICROALBUR 20 11/14/2014     Lipid Panel ( most recent) Lipid Panel     Component Value Date/Time   CHOL 99 (L) 02/02/2022 1107   CHOL 85 08/17/2012 1031   TRIG 213 (H) 02/02/2022 1107   TRIG 161 (H) 08/17/2012 1031   HDL 29 (L) 02/02/2022 1107  HDL 26 (L) 08/17/2012 1031   CHOLHDL 3.4 02/02/2022 1107   CHOLHDL 10.3 11/29/2012 1030   VLDL 39 11/29/2012 1030   LDLCALC 36 02/02/2022 1107   LDLCALC 27 08/17/2012 1031     Lab Results  Component Value Date   TSH 0.871 02/02/2022   TSH 1.160 07/30/2021   TSH 1.010 01/04/2021   TSH 0.671 06/29/2020   TSH 1.080 01/23/2020   TSH 1.320 07/26/2019   TSH 0.619 04/22/2019   TSH 0.477 01/25/2019   TSH 0.854 10/15/2018   TSH 1.130 04/16/2018   FREET4 1.52 02/02/2022   FREET4 1.77 07/30/2021   FREET4 1.57 01/04/2021   FREET4 2.22 (H) 06/29/2020   FREET4 1.38 01/23/2020   FREET4 1.91 (H) 04/22/2019   FREET4 1.69 10/15/2018   FREET4 1.43 04/16/2018   FREET4 1.68 01/02/2018   FREET4 1.52 09/04/2017      Assessment & Plan:   1) Type 2 diabetes mellitus with stage 3 chronic kidney disease, without long-term current use of insulin ([place])  - Diane Morgan has currently controlled type 2 DM since 51 years of age.  She presents today, accompanied by her mother, with her meter and logs showing at goal fasting and slightly above target postprandial readings.  She was not due for another A1c today, was most recently 7.8% on 11/15, essentially unchanged from previous  visit.  Analysis of her meter shows 7-day average of 160, 14-day average of 148, 30-day average of 148, 90-day average of 158.  She denies any hypoglycemia.  -her diabetes is complicated by obesity/sedentary life, CKD, smoking, and Diane Morgan remains at a high risk for more acute and chronic complications which include CAD, CVA, CKD, retinopathy, and neuropathy. These are all discussed in detail with the patient.  - Nutritional counseling repeated at each appointment due to patients tendency to fall back in to old habits.  - The patient admits there is a room for improvement in their diet and drink choices. -  Suggestion is made for the patient to avoid simple carbohydrates from their diet including Cakes, Sweet Desserts / Pastries, Ice Cream, Soda (diet and regular), Sweet Tea, Candies, Chips, Cookies, Sweet Pastries, Store Bought Juices, Alcohol in Excess of 1-2 drinks a day, Artificial Sweeteners, Coffee Creamer, and "Sugar-free" Products. This will help patient to have stable blood glucose profile and potentially avoid unintended weight gain.   - I encouraged the patient to switch to unprocessed or minimally processed complex starch and increased protein intake (animal or plant source), fruits, and vegetables.   - Patient is advised to stick to a routine mealtimes to eat 3 meals a day and avoid unnecessary snacks (to snack only to correct hypoglycemia).  - I have approached her with the following individualized plan to manage diabetes and patient agrees:   -She is advised to continue Trulicity 4.5 mg SQ weekly and Glipizide 5 mg XL daily with breakfast.  She is encouraged to eat a low fat diet and avoid over-indulging over the holiday season.   -She is advised to continue monitoring blood glucose at least once daily, before breakfast and to call the clinic if she has readings less than 70 or above 200 for 3 tests in a row.  - Patient specific target  A1c;  LDL, HDL, Triglycerides  were  discussed in detail.  2) BP/HTN:  -Her blood pressure is controlled to target without the use of antihypertensive medications.  3) Lipids/HPL:    Her most recent lipid panel from  02/02/22 shows controlled LDL of 36 and elevated triglycerides of 213 (improving).  She is advised to continue Atorvastatin 40 mg po daily at bedtime.  Side effects and precautions discussed with her.  She is advised to avoid fried foods and butter.  4)  Weight/Diet:  Her Body mass index is 35.33 kg/m.-she is a candidate for modest weight loss.  CDE Consult has been  initiated , exercise, and detailed carbohydrates information provided.  5) Hypothyroidism: -Diagnosed at approximate age of 29 years.  Her previsit thyroid function tests are consistent with appropriate hormone replacement.  She is advised to continue Levothyroxine 75 mcg po daily before breakfast.    - We discussed about the correct intake of her thyroid hormone, on empty stomach at fasting, with water, separated by at least 30 minutes from breakfast and other medications,  and separated by more than 4 hours from calcium, iron, multivitamins, acid reflux medications (PPIs). -Patient is made aware of the fact that thyroid hormone replacement is needed for life, dose to be adjusted by periodic monitoring of thyroid function tests.  6) Chronic Care/Health Maintenance: -she is on statin medications and is encouraged to continue to follow up with Ophthalmology, Dentist, Podiatrist at least yearly or according to recommendations, and advised to away from smoking.    I have recommended yearly flu vaccine and pneumonia vaccination at least every 5 years; moderate intensity exercise for up to 150 minutes weekly; and  sleep for at least 7 hours a day.  7) Vitamin D deficiency Her most recent vitamin  D level was 19.2 on 07/30/21.  I discussed and reinitiated Ergocalciferol 50000 units weekly.  She can also take the OTC Vitamin D3 5000 units daily to help boost  her numbers.  - I advised patient to maintain close follow up with Dettinger, Fransisca Kaufmann, MD for primary care needs.      I spent 47 minutes in the care of the patient today including review of labs from [facility], Lipids, Thyroid Function, Hematology (current and previous including abstractions from other facilities); face-to-face time discussing  her blood glucose readings/logs, discussing hypoglycemia and hyperglycemia episodes and symptoms, medications doses, her options of short and long term treatment based on the latest standards of care / guidelines;  discussion about incorporating lifestyle medicine;  and documenting the encounter. Risk reduction counseling performed per USPSTF guidelines to reduce obesity and cardiovascular risk factors.     Please refer to Patient Instructions for Blood Glucose Monitoring and Insulin/Medications Dosing Guide"  in media tab for additional information. Please  also refer to " Patient Self Inventory" in the Media  tab for reviewed elements of pertinent patient history.  Diane Morgan participated in the discussions, expressed understanding, and voiced agreement with the above plans.  All questions were answered to her satisfaction. she is encouraged to contact clinic should she have any questions or concerns prior to her return visit.   Follow up plan: - Return in about 3 months (around 06/06/2022) for Diabetes F/U with A1c in office, No previsit labs, Bring meter and logs.  Diane Morgan, [facility] [address] [place], [place] [address] Phone: [phone] Fax: [phone]  03/07/2022, 10:04 AM

## 2022-05-10 ENCOUNTER — Other Ambulatory Visit: Payer: Self-pay | Admitting: Nurse Practitioner

## 2022-05-24 ENCOUNTER — Other Ambulatory Visit: Payer: Self-pay | Admitting: Family Medicine

## 2022-05-24 DIAGNOSIS — Z1231 Encounter for screening mammogram for malignant neoplasm of breast: Secondary | ICD-10-CM

## 2022-05-25 ENCOUNTER — Encounter: Payer: Self-pay | Admitting: *Deleted

## 2022-06-08 ENCOUNTER — Ambulatory Visit
Admission: RE | Admit: 2022-06-08 | Discharge: 2022-06-08 | Disposition: A | Discharge status: Home / Self Care | Payer: Medicaid Other | Source: Ambulatory Visit | Attending: Family Medicine | Admitting: Family Medicine

## 2022-06-08 DIAGNOSIS — Z1231 Encounter for screening mammogram for malignant neoplasm of breast: Secondary | ICD-10-CM

## 2022-06-10 ENCOUNTER — Ambulatory Visit: Payer: Medicaid Other | Admitting: Nurse Practitioner

## 2022-06-10 ENCOUNTER — Encounter: Payer: Self-pay | Admitting: Nurse Practitioner

## 2022-06-10 VITALS — BP 124/85 | HR 91 | Ht 61.0 in | Wt 187.8 lb

## 2022-06-10 DIAGNOSIS — E1165 Type 2 diabetes mellitus with hyperglycemia: Secondary | ICD-10-CM

## 2022-06-10 DIAGNOSIS — E782 Mixed hyperlipidemia: Secondary | ICD-10-CM | POA: Diagnosis not present

## 2022-06-10 DIAGNOSIS — E559 Vitamin D deficiency, unspecified: Secondary | ICD-10-CM | POA: Diagnosis not present

## 2022-06-10 DIAGNOSIS — E039 Hypothyroidism, unspecified: Secondary | ICD-10-CM

## 2022-06-10 DIAGNOSIS — I10 Essential (primary) hypertension: Secondary | ICD-10-CM | POA: Diagnosis not present

## 2022-06-10 LAB — POCT GLYCOSYLATED HEMOGLOBIN (HGB A1C): Hemoglobin A1C: 7.8 % — AB (ref 4.0–5.6)

## 2022-06-10 NOTE — Progress Notes (Signed)
06/10/2022, 10:42 AM        Endocrinology follow-up note    Subjective:    Patient ID: Diane Morgan, female    DOB: June 01, 1970.  Diane Morgan is being iseen in follow-up for management of currently controlled symptomatic type 2 diabetes, hypothyroidism, hyperlipidemia, hypertension. PMD:  Dettinger, Fransisca Kaufmann, MD.    Past Medical History:  Diagnosis Date   Anemia    Arthritis    Cataract    right   Cellulitis and abscess of trunk 11/28/2012   Diabetic peripheral neuropathy (HCC)    DM (diabetes mellitus) ([place])    Eczema    GERD (gastroesophageal reflux disease)    Hyperlipidemia    Hypertension    Hypothyroidism    Leg pain, bilateral 10/08/09   NIDDM (non-insulin dependent diabetes mellitus)    URI (upper respiratory infection) 07/01/10   Past Surgical History:  Procedure Laterality Date   CATARACT EXTRACTION W/PHACO Right 06/12/2017   Procedure: CATARACT EXTRACTION WITH PHACOEMULSIFICATION  AND INTRAOCULAR LENS PLACEMENT RIGHT EYE;  Surgeon: Tonny Branch, MD;  Location: [facility];  Service: Ophthalmology;  Laterality: Right;  CDE: 1.16   CHOLECYSTECTOMY     EYE SURGERY     right catracts   EYE SURGERY  12/2019   left cataract   LASER ABLATION CONDOLAMATA N/A 03/08/2017   Procedure: LASER ABLATION OF THE CERVIX;  Surgeon: Florian Buff, MD;  Location: [facility];  Service: Gynecology;  Laterality: N/A;   WISDOM TOOTH EXTRACTION     Social History   Socioeconomic History   Marital status: Single    Spouse name: Not on file   Number of children: Not on file   Years of education: Not on file   Highest education level: Not on file  Occupational History   Not on file  Tobacco Use   Smoking status: Every Day    Years: 1    Types: Cigarettes   Smokeless tobacco: Never   Tobacco comments:    smokes 4 cig daily  Vaping Use   Vaping Use: Never used  Substance and Sexual Activity   Alcohol use: No   Drug use: No   Sexual activity: Not  Currently    Birth control/protection: None  Other Topics Concern   Not on file  Social History Narrative   Not on file   Social Determinants of Health   Financial Resource Strain: Not on file  Food Insecurity: Not on file  Transportation Needs: Not on file  Physical Activity: Not on file  Stress: Not on file  Social Connections: Not on file   Outpatient Encounter Medications as of 06/10/2022  Medication Sig   aspirin 81 MG chewable tablet Chew 81 mg by mouth daily.   atorvastatin (LIPITOR) 40 MG tablet Take 1 tablet (40 mg total) by mouth daily.   Blood Glucose Monitoring Suppl (ACCU-CHEK AVIVA PLUS) w/Device KIT USE AS DIRECTED   cetirizine (ZYRTEC) 10 MG tablet Take 10 mg by mouth at bedtime.    cycloSPORINE (RESTASIS) 0.05 % ophthalmic emulsion 1 drop 2 (two) times daily.   Dulaglutide (TRULICITY) 4.5 0000000 SOPN Inject 4.5 mg into the skin once a week.   fenofibrate (TRICOR) 145 MG tablet Take 1 tablet (145 mg total) by mouth daily.   gabapentin (NEURONTIN) 300 MG capsule Take 1 capsule (300 mg total) by mouth at bedtime.   glipiZIDE (GLUCOTROL XL) 5 MG 24 hr tablet Take 1 tablet (5 mg total)  by mouth daily with breakfast.   glucose blood (ACCU-CHEK GUIDE) test strip USE 1 STRIP TO CHECK GLUCOSE THREE TIMES DAILY AS DIRECTED Dx E11.65   ibuprofen (ADVIL,MOTRIN) 200 MG tablet Take 400 mg by mouth every 6 (six) hours as needed for headache or moderate pain.   levothyroxine (SYNTHROID) 75 MCG tablet Take 1 tablet (75 mcg total) by mouth daily before breakfast.   Vitamin D, Ergocalciferol, (DRISDOL) 1.25 MG (50000 UNIT) CAPS capsule Take 1 capsule by mouth once a week   No facility-administered encounter medications on file as of 06/10/2022.    ALLERGIES: Allergies  Allergen Reactions   Penicillins Anaphylaxis, Hives and Other (See Comments)    Childhood Reaction. Has patient had a PCN reaction causing immediate rash, facial/tongue/throat swelling, SOB or lightheadedness with  hypotension: Yes Has patient had a PCN reaction causing severe rash involving mucus membranes or skin necrosis: No Has patient had a PCN reaction that required hospitalization: No Has patient had a PCN reaction occurring within the last 10 years: No If all of the above answers are "NO", then may proceed with Cephalosporin use.    Niaspan [Niacin] Other (See Comments)    Patient passes out.    VACCINATION STATUS: Immunization History  Administered Date(s) Administered   Influenza,inj,Quad PF,6+ Mos 11/28/2012, 01/22/2014, 12/26/2014, 12/07/2015, 12/15/2016, 01/02/2018, 01/07/2019, 01/23/2020, 01/29/2021, 02/02/2022   PFIZER(Purple Top)SARS-COV-2 Vaccination 06/29/2019, 07/20/2019, 01/23/2020   PNEUMOCOCCAL CONJUGATE-20 02/02/2022   Pneumococcal Polysaccharide-23 01/07/2019   Tdap 03/19/2013   Diabetes She presents for her follow-up diabetic visit. She has type 2 diabetes mellitus. Onset time: Diagnosed at approx age of 46. Her disease course has been stable. Pertinent negatives for hypoglycemia include no nervousness/anxiousness or tremors. There are no diabetic associated symptoms. Pertinent negatives for diabetes include no fatigue and no weight loss. There are no hypoglycemic complications. Symptoms are stable. Diabetic complications include nephropathy. Risk factors for coronary artery disease include diabetes mellitus, dyslipidemia, hypertension, obesity, sedentary lifestyle and tobacco exposure. Current diabetic treatment includes oral agent (monotherapy) (Trulicity 4.5 SQ weekly). She is compliant with treatment all of the time. Her weight is fluctuating minimally. She is following a generally healthy diet. When asked about meal planning, she reported none. She has not had a previous visit with a dietitian. She rarely participates in exercise. Her home blood glucose trend is fluctuating minimally. Her breakfast blood glucose range is generally 140-180 mg/dl. Her bedtime blood glucose range  is generally 140-180 mg/dl. Her overall blood glucose range is 140-180 mg/dl. (She presents today, accompanied by her mother, with her meter and logs showing at goal fasting and slightly above target postprandial readings.  Her POCT A1c today is 7.8%, unchanged from previous visit.  Analysis of her meter shows 7-day average of 143, 14-day average of 138, 30-day average of 155, 90-day average of 166.  She denies any hypoglycemia.) An ACE inhibitor/angiotensin II receptor blocker is not being taken. She does not see a podiatrist.Eye exam is current.  Thyroid Problem Presents for follow-up visit. Symptoms include weight gain. Patient reports no anxiety, cold intolerance, constipation, depressed mood, diarrhea, fatigue, heat intolerance, leg swelling, palpitations, tremors or weight loss. The symptoms have been stable. Her past medical history is significant for diabetes and hyperlipidemia.  Hyperlipidemia This is a chronic problem. The current episode started more than 1 year ago. The problem is uncontrolled. Recent lipid tests were reviewed and are variable. Exacerbating diseases include chronic renal disease, diabetes, hypothyroidism and obesity. Factors aggravating her hyperlipidemia include fatty foods. Current antihyperlipidemic  treatment includes statins and fibric acid derivatives. The current treatment provides moderate improvement of lipids. There are no compliance problems.  Risk factors for coronary artery disease include diabetes mellitus, dyslipidemia, hypertension, obesity and a sedentary lifestyle.   Review of systems  Constitutional: + stable body weight,  current Body mass index is 35.48 kg/m. , no fatigue, no subjective hyperthermia, no subjective hypothermia Eyes: no blurry vision, no xerophthalmia ENT: no sore throat, no nodules palpated in throat, no dysphagia/odynophagia, no hoarseness Cardiovascular: no chest pain, no shortness of breath, no palpitations, no leg swelling Respiratory:  no cough, no shortness of breath Gastrointestinal: no nausea/vomiting/diarrhea Musculoskeletal: no muscle/joint aches Skin: no rashes, no hyperemia Neurological: no tremors, no numbness, no tingling, no dizziness Psychiatric: no depression, no anxiety   Objective:    BP 124/85 (BP Location: Left Arm, Patient Position: Sitting, Cuff Size: Large)   Pulse 91   Ht 5\' 1"  (1.549 m)   Wt 187 lb 12.8 oz (85.2 kg)   BMI 35.48 kg/m   Wt Readings from Last 3 Encounters:  06/10/22 187 lb 12.8 oz (85.2 kg)  03/07/22 187 lb (84.8 kg)  02/02/22 189 lb (85.7 kg)    BP Readings from Last 3 Encounters:  06/10/22 124/85  03/07/22 115/79  02/02/22 119/78     Physical Exam- Limited  Constitutional:  Body mass index is 35.48 kg/m. , not in acute distress, normal state of mind Eyes:  EOMI, no exophthalmos Musculoskeletal: no gross deformities, strength intact in all four extremities, no gross restriction of joint movements Skin:  no rashes, no hyperemia Neurological: no tremor with outstretched hands    CMP     Component Value Date/Time   NA 140 02/02/2022 1107   K 4.7 02/02/2022 1107   CL 106 02/02/2022 1107   CO2 20 02/02/2022 1107   GLUCOSE 127 (H) 02/02/2022 1107   GLUCOSE 438 (H) 06/06/2017 0811   BUN 12 02/02/2022 1107   CREATININE 1.13 (H) 02/02/2022 1107   CREATININE 1.13 (H) 08/17/2012 1031   CALCIUM 9.1 02/02/2022 1107   PROT 5.9 (L) 02/02/2022 1107   ALBUMIN 3.8 (L) 02/02/2022 1107   AST 17 02/02/2022 1107   ALT 28 02/02/2022 1107   ALKPHOS 154 (H) 02/02/2022 1107   BILITOT 0.7 02/02/2022 1107   GFRNONAA 50 (L) 01/23/2020 1126   GFRNONAA 61 08/17/2012 1031   GFRAA 58 (L) 01/23/2020 1126   GFRAA 70 08/17/2012 1031     Diabetic Labs (most recent): Lab Results  Component Value Date   HGBA1C 7.8 (A) 06/10/2022   HGBA1C 7.8 (H) 02/02/2022   HGBA1C 7.7 (H) 10/29/2021   MICROALBUR 30 mg/L 12/03/2021   MICROALBUR 150 10/09/2020   MICROALBUR 20 11/14/2014      Lipid Panel ( most recent) Lipid Panel     Component Value Date/Time   CHOL 99 (L) 02/02/2022 1107   CHOL 85 08/17/2012 1031   TRIG 213 (H) 02/02/2022 1107   TRIG 161 (H) 08/17/2012 1031   HDL 29 (L) 02/02/2022 1107   HDL 26 (L) 08/17/2012 1031   CHOLHDL 3.4 02/02/2022 1107   CHOLHDL 10.3 11/29/2012 1030   VLDL 39 11/29/2012 1030   LDLCALC 36 02/02/2022 1107   LDLCALC 27 08/17/2012 1031     Lab Results  Component Value Date   TSH 0.871 02/02/2022   TSH 1.160 07/30/2021   TSH 1.010 01/04/2021   TSH 0.671 06/29/2020   TSH 1.080 01/23/2020   TSH 1.320 07/26/2019   TSH 0.619  04/22/2019   TSH 0.477 01/25/2019   TSH 0.854 10/15/2018   TSH 1.130 04/16/2018   FREET4 1.52 02/02/2022   FREET4 1.77 07/30/2021   FREET4 1.57 01/04/2021   FREET4 2.22 (H) 06/29/2020   FREET4 1.38 01/23/2020   FREET4 1.91 (H) 04/22/2019   FREET4 1.69 10/15/2018   FREET4 1.43 04/16/2018   FREET4 1.68 01/02/2018   FREET4 1.52 09/04/2017      Assessment & Plan:   1) Type 2 diabetes mellitus with stage 3 chronic kidney disease, without long-term current use of insulin ([place])  - Diane Morgan has currently controlled type 2 DM since 52 years of age.  She presents today, accompanied by her mother, with her meter and logs showing at goal fasting and slightly above target postprandial readings.  Her POCT A1c today is 7.8%, unchanged from previous visit.  Analysis of her meter shows 7-day average of 143, 14-day average of 138, 30-day average of 155, 90-day average of 166.  She denies any hypoglycemia.  -her diabetes is complicated by obesity/sedentary life, CKD, smoking, and Diane Morgan remains at a high risk for more acute and chronic complications which include CAD, CVA, CKD, retinopathy, and neuropathy. These are all discussed in detail with the patient.  - Nutritional counseling repeated at each appointment due to patients tendency to fall back in to old habits.  - The patient admits there is a  room for improvement in their diet and drink choices. -  Suggestion is made for the patient to avoid simple carbohydrates from their diet including Cakes, Sweet Desserts / Pastries, Ice Cream, Soda (diet and regular), Sweet Tea, Candies, Chips, Cookies, Sweet Pastries, Store Bought Juices, Alcohol in Excess of 1-2 drinks a day, Artificial Sweeteners, Coffee Creamer, and "Sugar-free" Products. This will help patient to have stable blood glucose profile and potentially avoid unintended weight gain.   - I encouraged the patient to switch to unprocessed or minimally processed complex starch and increased protein intake (animal or plant source), fruits, and vegetables.   - Patient is advised to stick to a routine mealtimes to eat 3 meals a day and avoid unnecessary snacks (to snack only to correct hypoglycemia).  - I have approached her with the following individualized plan to manage diabetes and patient agrees:   -She is advised to continue Trulicity 4.5 mg SQ weekly and Glipizide 5 mg XL daily with breakfast.  She is encouraged to eat a low fat diet and avoid over-indulging and avoid sweetened beverages as well.   -She is advised to continue monitoring blood glucose at least once daily, before breakfast and to call the clinic if she has readings less than 70 or above 200 for 3 tests in a row.  - Patient specific target  A1c;  LDL, HDL, Triglycerides  were discussed in detail.  2) BP/HTN:  -Her blood pressure is controlled to target without the use of antihypertensive medications.  3) Lipids/HPL:    Her most recent lipid panel from 02/02/22 shows controlled LDL of 36 and elevated triglycerides of 213 (improving).  She is advised to continue Atorvastatin 40 mg po daily at bedtime.  Side effects and precautions discussed with her.  She is advised to avoid fried foods and butter.  4)  Weight/Diet:  Her Body mass index is 35.48 kg/m.-she is a candidate for modest weight loss.  CDE Consult has been   initiated , exercise, and detailed carbohydrates information provided.  5) Hypothyroidism: -Diagnosed at approximate age of 46 years.  There are no recent TFTs to review.  She is advised to continue Levothyroxine 75 mcg po daily before breakfast.  Will recheck prior to next visit and adjust dose accordingly.   - We discussed about the correct intake of her thyroid hormone, on empty stomach at fasting, with water, separated by at least 30 minutes from breakfast and other medications,  and separated by more than 4 hours from calcium, iron, multivitamins, acid reflux medications (PPIs). -Patient is made aware of the fact that thyroid hormone replacement is needed for life, dose to be adjusted by periodic monitoring of thyroid function tests.  6) Chronic Care/Health Maintenance: -she is on statin medications and is encouraged to continue to follow up with Ophthalmology, Dentist, Podiatrist at least yearly or according to recommendations, and advised to away from smoking.    I have recommended yearly flu vaccine and pneumonia vaccination at least every 5 years; moderate intensity exercise for up to 150 minutes weekly; and  sleep for at least 7 hours a day.  7) Vitamin D deficiency Her most recent vitamin  D level was 19.2 on 07/30/21.  I discussed and reinitiated Ergocalciferol 50000 units weekly.  She can also take the OTC Vitamin D3 5000 units daily to help boost her numbers.  Will recheck vitamin d prior to next visit.  - I advised patient to maintain close follow up with Dettinger, Fransisca Kaufmann, MD for primary care needs.      I spent  30  minutes in the care of the patient today including review of labs from [facility], Lipids, Thyroid Function, Hematology (current and previous including abstractions from other facilities); face-to-face time discussing  her blood glucose readings/logs, discussing hypoglycemia and hyperglycemia episodes and symptoms, medications doses, her options of short and long term  treatment based on the latest standards of care / guidelines;  discussion about incorporating lifestyle medicine;  and documenting the encounter. Risk reduction counseling performed per USPSTF guidelines to reduce obesity and cardiovascular risk factors.     Please refer to Patient Instructions for Blood Glucose Monitoring and Insulin/Medications Dosing Guide"  in media tab for additional information. Please  also refer to " Patient Self Inventory" in the Media  tab for reviewed elements of pertinent patient history.  Diane Morgan participated in the discussions, expressed understanding, and voiced agreement with the above plans.  All questions were answered to her satisfaction. she is encouraged to contact clinic should she have any questions or concerns prior to her return visit.   Follow up plan: - Return in about 3 months (around 09/10/2022) for Diabetes F/U with A1c in office, Thyroid follow up, Previsit labs, Bring meter and logs.  Rayetta Pigg, [facility] [address] [place], [place] [address] Phone: [phone] Fax: [phone]  06/10/2022, 10:42 AM

## 2022-07-23 ENCOUNTER — Other Ambulatory Visit: Payer: Self-pay | Admitting: Family Medicine

## 2022-07-23 DIAGNOSIS — I10 Essential (primary) hypertension: Secondary | ICD-10-CM

## 2022-07-23 DIAGNOSIS — E785 Hyperlipidemia, unspecified: Secondary | ICD-10-CM

## 2022-07-23 DIAGNOSIS — E1142 Type 2 diabetes mellitus with diabetic polyneuropathy: Secondary | ICD-10-CM

## 2022-08-05 ENCOUNTER — Ambulatory Visit: Payer: Medicaid Other | Admitting: Family Medicine

## 2022-08-10 ENCOUNTER — Ambulatory Visit: Payer: Medicaid Other | Admitting: Family Medicine

## 2022-08-16 ENCOUNTER — Telehealth: Payer: Self-pay | Admitting: *Deleted

## 2022-08-16 NOTE — Telephone Encounter (Signed)
Trulicity 4.5 mg is on a National Back order. They have tried many pharmacies and is told the same thing.She is asking what can they do? I called her mother back and she shares with me that she has even ask about lower doses of the Trulicity and all the pharmacies that she has called say that they do not have lower doses. She is asking if the patient may go Ozempic? Advised her that the provider will not be in the office until Thursday. I am also [facility] of the Trulicity.

## 2022-08-17 ENCOUNTER — Other Ambulatory Visit: Payer: Self-pay | Admitting: Pharmacist

## 2022-08-17 ENCOUNTER — Telehealth: Payer: Self-pay | Admitting: *Deleted

## 2022-08-17 NOTE — Progress Notes (Signed)
   08/17/2022 Name: Diane Morgan MRN: [id number] DOB: [birth year]  Collaboration with Endocrine office [id number].  Patient is on Trulicity 4.5mg  weekly and is unable to find it due to shortages.  Samples of Trulicity 3mg  (69-month supply) were provided to patient to bridge supply.  If unable to find next month, we can discuss switching to Ozempic which appears to be in stock and covered be medicaid.  A new PA will likely be needed.  Kieth Brightly, PharmD, BCACP Clinical Pharmacist, [facility]

## 2022-08-17 NOTE — Telephone Encounter (Signed)
Update from the telephone call yesterday,08/16/2022. Talked with the pharmacist at patient's PCP. She shared that she had 2 or maybe 3 pens left of the Trulicity 3 mg. She said that she would be happy to give them to [facility]. This would be the last time that she could as they are not currently sampling  Trulicity to them.  I called and spoke with the patient's mother , Lottie. She will go tomorrow and pick those up. She also has reached out to [facility] in [place] , [place]. They ask her to call them back today after 2 pm and that they may have some 3 mg pens that we could sen in a script for,they are not sure about the 4.5 mg . Lottie will let me know, and what we need to do.

## 2022-08-18 ENCOUNTER — Other Ambulatory Visit: Payer: Self-pay | Admitting: *Deleted

## 2022-08-18 DIAGNOSIS — E1165 Type 2 diabetes mellitus with hyperglycemia: Secondary | ICD-10-CM

## 2022-08-18 MED ORDER — TRULICITY 4.5 MG/0.5ML ~~LOC~~ SOAJ: 4.5000 mg | SUBCUTANEOUS | 2 refills | Status: DC

## 2022-08-18 NOTE — Telephone Encounter (Signed)
Talked with the patient's mother. She shared that she called the [facility] on [address] in [place]. They ask her to have a RX sent to them as they are getting in Trulicity 3 mg and 4.5 mg daily.  A prescription was sent in for the Trulicity 4.5 mg.

## 2022-08-18 NOTE — Telephone Encounter (Signed)
Ok whatever dose they can find in the pharmacy, we can send in for her whether it is the 3 mg or the 4.5 mg doses.

## 2022-08-29 ENCOUNTER — Ambulatory Visit: Payer: Medicaid Other

## 2022-08-30 ENCOUNTER — Other Ambulatory Visit: Payer: Self-pay | Admitting: "Endocrinology

## 2022-09-02 ENCOUNTER — Other Ambulatory Visit: Payer: Medicaid Other

## 2022-09-02 DIAGNOSIS — E1165 Type 2 diabetes mellitus with hyperglycemia: Secondary | ICD-10-CM | POA: Diagnosis not present

## 2022-09-02 DIAGNOSIS — E559 Vitamin D deficiency, unspecified: Secondary | ICD-10-CM | POA: Diagnosis not present

## 2022-09-02 DIAGNOSIS — E039 Hypothyroidism, unspecified: Secondary | ICD-10-CM | POA: Diagnosis not present

## 2022-09-03 LAB — COMPREHENSIVE METABOLIC PANEL
ALT: 23 IU/L (ref 0–32)
AST: 29 IU/L (ref 0–40)
Albumin/Globulin Ratio: 2
Albumin: 4 g/dL (ref 3.9–4.9)
Alkaline Phosphatase: 66 IU/L (ref 44–121)
BUN/Creatinine Ratio: 9 (ref 9–23)
BUN: 16 mg/dL (ref 6–24)
Bilirubin Total: 0.5 mg/dL (ref 0.0–1.2)
CO2: 23 mmol/L (ref 20–29)
Calcium: 9.9 mg/dL (ref 8.7–10.2)
Chloride: 102 mmol/L (ref 96–106)
Creatinine, Ser: 1.78 mg/dL — ABNORMAL HIGH (ref 0.57–1.00)
Globulin, Total: 2 g/dL (ref 1.5–4.5)
Glucose: 91 mg/dL (ref 70–99)
Potassium: 4.9 mmol/L (ref 3.5–5.2)
Sodium: 139 mmol/L (ref 134–144)
Total Protein: 6 g/dL (ref 6.0–8.5)
eGFR: 34 mL/min/{1.73_m2} — ABNORMAL LOW (ref >59)

## 2022-09-03 LAB — TSH: TSH: 0.682 u[IU]/mL (ref 0.450–4.500)

## 2022-09-03 LAB — VITAMIN D 25 HYDROXY (VIT D DEFICIENCY, FRACTURES): Vit D, 25-Hydroxy: 23.7 ng/mL — ABNORMAL LOW (ref 30.0–100.0)

## 2022-09-03 LAB — T4, FREE: Free T4: 2.11 ng/dL — ABNORMAL HIGH (ref 0.82–1.77)

## 2022-09-09 ENCOUNTER — Other Ambulatory Visit: Payer: Self-pay | Admitting: Family Medicine

## 2022-09-09 DIAGNOSIS — E1142 Type 2 diabetes mellitus with diabetic polyneuropathy: Secondary | ICD-10-CM

## 2022-09-09 DIAGNOSIS — E785 Hyperlipidemia, unspecified: Secondary | ICD-10-CM

## 2022-09-12 ENCOUNTER — Encounter: Payer: Self-pay | Admitting: Nurse Practitioner

## 2022-09-12 ENCOUNTER — Ambulatory Visit: Payer: Medicaid Other | Admitting: Nurse Practitioner

## 2022-09-12 VITALS — BP 108/70 | HR 62 | Ht 61.0 in | Wt 182.8 lb

## 2022-09-12 DIAGNOSIS — E039 Hypothyroidism, unspecified: Secondary | ICD-10-CM

## 2022-09-12 DIAGNOSIS — I10 Essential (primary) hypertension: Secondary | ICD-10-CM | POA: Diagnosis not present

## 2022-09-12 DIAGNOSIS — E559 Vitamin D deficiency, unspecified: Secondary | ICD-10-CM

## 2022-09-12 DIAGNOSIS — Z7985 Long-term (current) use of injectable non-insulin antidiabetic drugs: Secondary | ICD-10-CM | POA: Diagnosis not present

## 2022-09-12 DIAGNOSIS — Z7984 Long term (current) use of oral hypoglycemic drugs: Secondary | ICD-10-CM

## 2022-09-12 DIAGNOSIS — E1165 Type 2 diabetes mellitus with hyperglycemia: Secondary | ICD-10-CM

## 2022-09-12 DIAGNOSIS — E782 Mixed hyperlipidemia: Secondary | ICD-10-CM | POA: Diagnosis not present

## 2022-09-12 LAB — POCT GLYCOSYLATED HEMOGLOBIN (HGB A1C): Hemoglobin A1C: 6 % — AB (ref 4.0–5.6)

## 2022-09-12 MED ORDER — VITAMIN D (ERGOCALCIFEROL) 1.25 MG (50000 UNIT) PO CAPS: 50000.0000 [IU] | ORAL_CAPSULE | ORAL | 0 refills | Status: DC

## 2022-09-12 NOTE — Progress Notes (Signed)
09/12/2022, 8:30 AM        Endocrinology follow-up note    Subjective:    Patient ID: Diane Morgan, female    DOB: 09/02/1970.  Diane Morgan is being iseen in follow-up for management of currently controlled symptomatic type 2 diabetes, hypothyroidism, hyperlipidemia, hypertension. PMD:  Dettinger, Elige Radon, MD.    Past Medical History:  Diagnosis Date   Anemia    Arthritis    Cataract    right   Cellulitis and abscess of trunk 11/28/2012   Diabetic peripheral neuropathy (HCC)    DM (diabetes mellitus) (HCC)    Eczema    GERD (gastroesophageal reflux disease)    Hyperlipidemia    Hypertension    Hypothyroidism    Leg pain, bilateral 10/08/09   NIDDM (non-insulin dependent diabetes mellitus)    URI (upper respiratory infection) 07/01/10   Past Surgical History:  Procedure Laterality Date   CATARACT EXTRACTION W/PHACO Right 06/12/2017   Procedure: CATARACT EXTRACTION WITH PHACOEMULSIFICATION  AND INTRAOCULAR LENS PLACEMENT RIGHT EYE;  Surgeon: Gemma Payor, MD;  Location: [facility];  Service: Ophthalmology;  Laterality: Right;  CDE: 1.16   CHOLECYSTECTOMY     EYE SURGERY     right catracts   EYE SURGERY  12/2019   left cataract   LASER ABLATION CONDOLAMATA N/A 03/08/2017   Procedure: LASER ABLATION OF THE CERVIX;  Surgeon: Lazaro Arms, MD;  Location: [facility];  Service: Gynecology;  Laterality: N/A;   WISDOM TOOTH EXTRACTION     Social History   Socioeconomic History   Marital status: Single    Spouse name: Not on file   Number of children: Not on file   Years of education: Not on file   Highest education level: Not on file  Occupational History   Not on file  Tobacco Use   Smoking status: Every Day    Years: 1    Types: Cigarettes   Smokeless tobacco: Never   Tobacco comments:    smokes 4 cig daily  Vaping Use   Vaping Use: Never used  Substance and Sexual Activity   Alcohol use: No   Drug use: No   Sexual activity: Not  Currently    Birth control/protection: None  Other Topics Concern   Not on file  Social History Narrative   Not on file   Social Determinants of Health   Financial Resource Strain: Not on file  Food Insecurity: Not on file  Transportation Needs: Not on file  Physical Activity: Not on file  Stress: Not on file  Social Connections: Not on file   Outpatient Encounter Medications as of 09/12/2022  Medication Sig   aspirin 81 MG chewable tablet Chew 81 mg by mouth daily.   atorvastatin (LIPITOR) 40 MG tablet Take 1 tablet (40 mg total) by mouth daily. (NEEDS TO BE SEEN BEFORE NEXT REFILL)   Blood Glucose Monitoring Suppl (ACCU-CHEK AVIVA PLUS) w/Device KIT USE AS DIRECTED   cetirizine (ZYRTEC) 10 MG tablet Take 10 mg by mouth at bedtime.    cycloSPORINE (RESTASIS) 0.05 % ophthalmic emulsion 1 drop 2 (two) times daily.   Dulaglutide (TRULICITY) 4.5 MG/0.5ML SOPN Inject 4.5 mg into the skin once a week.   fenofibrate (TRICOR) 145 MG tablet Take 1 tablet by mouth once daily   gabapentin (NEURONTIN) 300 MG capsule Take 1 capsule (300 mg total) by mouth at bedtime.   glucose blood (ACCU-CHEK GUIDE) test strip USE 1  THREE TIMES DAILY AS DIRECTED E11.65   ibuprofen (ADVIL,MOTRIN) 200 MG tablet Take 400 mg by mouth every 6 (six) hours as needed for headache or moderate pain.   levothyroxine (SYNTHROID) 75 MCG tablet Take 1 tablet (75 mcg total) by mouth daily before breakfast.   [DISCONTINUED] glipiZIDE (GLUCOTROL XL) 5 MG 24 hr tablet Take 1 tablet (5 mg total) by mouth daily with breakfast.   [DISCONTINUED] Vitamin D, Ergocalciferol, (DRISDOL) 1.25 MG (50000 UNIT) CAPS capsule Take 1 capsule by mouth once a week   Vitamin D, Ergocalciferol, (DRISDOL) 1.25 MG (50000 UNIT) CAPS capsule Take 1 capsule (50,000 Units total) by mouth once a week.   [DISCONTINUED] fenofibrate (TRICOR) 145 MG tablet Take 1 tablet (145 mg total) by mouth daily.   No facility-administered encounter medications on file  as of 09/12/2022.    ALLERGIES: Allergies  Allergen Reactions   Penicillins Anaphylaxis, Hives and Other (See Comments)    Childhood Reaction. Has patient had a PCN reaction causing immediate rash, facial/tongue/throat swelling, SOB or lightheadedness with hypotension: Yes Has patient had a PCN reaction causing severe rash involving mucus membranes or skin necrosis: No Has patient had a PCN reaction that required hospitalization: No Has patient had a PCN reaction occurring within the last 10 years: No If all of the above answers are "NO", then may proceed with Cephalosporin use.    Niaspan [Niacin] Other (See Comments)    Patient passes out.    VACCINATION STATUS: Immunization History  Administered Date(s) Administered   Influenza,inj,Quad PF,6+ Mos 11/28/2012, 01/22/2014, 12/26/2014, 12/07/2015, 12/15/2016, 01/02/2018, 01/07/2019, 01/23/2020, 01/29/2021, 02/02/2022   PFIZER(Purple Top)SARS-COV-2 Vaccination 06/29/2019, 07/20/2019, 01/23/2020   PNEUMOCOCCAL CONJUGATE-20 02/02/2022   Pneumococcal Polysaccharide-23 01/07/2019   Tdap 03/19/2013   Diabetes She presents for her follow-up diabetic visit. She has type 2 diabetes mellitus. Onset time: Diagnosed at approx age of 87. Her disease course has been improving. Pertinent negatives for hypoglycemia include no nervousness/anxiousness or tremors. There are no diabetic associated symptoms. Pertinent negatives for diabetes include no fatigue and no weight loss. There are no hypoglycemic complications. Symptoms are stable. Diabetic complications include nephropathy. Risk factors for coronary artery disease include diabetes mellitus, dyslipidemia, hypertension, obesity, sedentary lifestyle and tobacco exposure. Current diabetic treatment includes oral agent (monotherapy) (Trulicity 4.5 SQ weekly). She is compliant with treatment all of the time. Her weight is fluctuating minimally. She is following a generally healthy diet. When asked about meal  planning, she reported none. She has not had a previous visit with a dietitian. She rarely participates in exercise. Her home blood glucose trend is fluctuating minimally. Her breakfast blood glucose range is generally 90-110 mg/dl. Her bedtime blood glucose range is generally 90-110 mg/dl. Her overall blood glucose range is 90-110 mg/dl. (She presents today, accompanied by her mother, with her meter and logs showing at goal glycemic profile overall.  Her POCT A1c today is 6%, improving from last visit of 7.8%.  Analysis of her meter shows 7-day average of 105, 14-day average of 101, 30-day average of 102, 90-day average of 129.  She does have mild hypoglycemia randomly but does not have symptoms of such.) An ACE inhibitor/angiotensin II receptor blocker is not being taken. She does not see a podiatrist.Eye exam is current.  Thyroid Problem Presents for follow-up visit. Symptoms include weight gain. Patient reports no anxiety, cold intolerance, constipation, depressed mood, diarrhea, fatigue, heat intolerance, leg swelling, palpitations, tremors or weight loss. The symptoms have been stable. Her past medical history is significant for  diabetes and hyperlipidemia.  Hyperlipidemia This is a chronic problem. The current episode started more than 1 year ago. The problem is uncontrolled. Recent lipid tests were reviewed and are variable. Exacerbating diseases include chronic renal disease, diabetes, hypothyroidism and obesity. Factors aggravating her hyperlipidemia include fatty foods. Current antihyperlipidemic treatment includes statins and fibric acid derivatives. The current treatment provides moderate improvement of lipids. There are no compliance problems.  Risk factors for coronary artery disease include diabetes mellitus, dyslipidemia, hypertension, obesity and a sedentary lifestyle.   Review of systems  Constitutional: + decreasing body weight,  current Body mass index is 34.54 kg/m. , no fatigue, no  subjective hyperthermia, no subjective hypothermia Eyes: no blurry vision, no xerophthalmia ENT: no sore throat, no nodules palpated in throat, no dysphagia/odynophagia, no hoarseness Cardiovascular: no chest pain, no shortness of breath, no palpitations, no leg swelling Respiratory: no cough, no shortness of breath Gastrointestinal: no nausea/vomiting/diarrhea Musculoskeletal: no muscle/joint aches Skin: no rashes, no hyperemia Neurological: no tremors, no numbness, no tingling, no dizziness Psychiatric: no depression, no anxiety   Objective:    BP 108/70 (BP Location: Right Arm, Patient Position: Sitting, Cuff Size: Large)   Pulse 62   Ht 5\' 1"  (1.549 m)   Wt 182 lb 12.8 oz (82.9 kg)   BMI 34.54 kg/m   Wt Readings from Last 3 Encounters:  09/12/22 182 lb 12.8 oz (82.9 kg)  06/10/22 187 lb 12.8 oz (85.2 kg)  03/07/22 187 lb (84.8 kg)    BP Readings from Last 3 Encounters:  09/12/22 108/70  06/10/22 124/85  03/07/22 115/79     Physical Exam- Limited  Constitutional:  Body mass index is 34.54 kg/m. , not in acute distress, normal state of mind Eyes:  EOMI, no exophthalmos Musculoskeletal: no gross deformities, strength intact in all four extremities, no gross restriction of joint movements Skin:  no rashes, no hyperemia Neurological: no tremor with outstretched hands   Diabetic Foot Exam - Simple   No data filed      CMP     Component Value Date/Time   NA 139 09/02/2022 0837   K 4.9 09/02/2022 0837   CL 102 09/02/2022 0837   CO2 23 09/02/2022 0837   GLUCOSE 91 09/02/2022 0837   GLUCOSE 438 (H) 06/06/2017 0811   BUN 16 09/02/2022 0837   CREATININE 1.78 (H) 09/02/2022 0837   CREATININE 1.13 (H) 08/17/2012 1031   CALCIUM 9.9 09/02/2022 0837   PROT 6.0 09/02/2022 0837   ALBUMIN 4.0 09/02/2022 0837   AST 29 09/02/2022 0837   ALT 23 09/02/2022 0837   ALKPHOS 66 09/02/2022 0837   BILITOT 0.5 09/02/2022 0837   GFRNONAA 50 (L) 01/23/2020 1126   GFRNONAA 61  08/17/2012 1031   GFRAA 58 (L) 01/23/2020 1126   GFRAA 70 08/17/2012 1031     Diabetic Labs (most recent): Lab Results  Component Value Date   HGBA1C 6.0 (A) 09/12/2022   HGBA1C 7.8 (A) 06/10/2022   HGBA1C 7.8 (H) 02/02/2022   MICROALBUR 30 mg/L 12/03/2021   MICROALBUR 150 10/09/2020   MICROALBUR 20 11/14/2014     Lipid Panel ( most recent) Lipid Panel     Component Value Date/Time   CHOL 99 (L) 02/02/2022 1107   CHOL 85 08/17/2012 1031   TRIG 213 (H) 02/02/2022 1107   TRIG 161 (H) 08/17/2012 1031   HDL 29 (L) 02/02/2022 1107   HDL 26 (L) 08/17/2012 1031   CHOLHDL 3.4 02/02/2022 1107   CHOLHDL 10.3 11/29/2012 1030  VLDL 39 11/29/2012 1030   LDLCALC 36 02/02/2022 1107   LDLCALC 27 08/17/2012 1031     Lab Results  Component Value Date   TSH 0.682 09/02/2022   TSH 0.871 02/02/2022   TSH 1.160 07/30/2021   TSH 1.010 01/04/2021   TSH 0.671 06/29/2020   TSH 1.080 01/23/2020   TSH 1.320 07/26/2019   TSH 0.619 04/22/2019   TSH 0.477 01/25/2019   TSH 0.854 10/15/2018   FREET4 2.11 (H) 09/02/2022   FREET4 1.52 02/02/2022   FREET4 1.77 07/30/2021   FREET4 1.57 01/04/2021   FREET4 2.22 (H) 06/29/2020   FREET4 1.38 01/23/2020   FREET4 1.91 (H) 04/22/2019   FREET4 1.69 10/15/2018   FREET4 1.43 04/16/2018   FREET4 1.68 01/02/2018     Latest Reference Range & Units 01/04/21 09:15 07/30/21 09:27 07/30/21 09:36 02/02/22 11:07 09/02/22 08:37  TSH 0.450 - 4.500 uIU/mL 1.010 1.160  0.871 0.682  T4,Free(Direct) 0.82 - 1.77 ng/dL 1.61  0.96 0.45 4.09 (H)  Thyroxine (T4) 4.5 - 12.0 ug/dL  9.7     Free Thyroxine Index 1.2 - 4.9   3.2     T3 Uptake Ratio 24 - 39 %  33     (H): Data is abnormally high  Assessment & Plan:   1) Type 2 diabetes mellitus with stage 3 chronic kidney disease, without long-term current use of insulin (HCC)  - Sherrina L Marcussen has currently controlled type 2 DM since 52 years of age.  She presents today, accompanied by her mother, with her meter  and logs showing at goal glycemic profile overall.  Her POCT A1c today is 6%, improving from last visit of 7.8%.  Analysis of her meter shows 7-day average of 105, 14-day average of 101, 30-day average of 102, 90-day average of 129.  She does have mild hypoglycemia randomly but does not have symptoms of such.  -her diabetes is complicated by obesity/sedentary life, CKD, smoking, and Shawntavia L Tirone remains at a high risk for more acute and chronic complications which include CAD, CVA, CKD, retinopathy, and neuropathy. These are all discussed in detail with the patient.  - Nutritional counseling repeated at each appointment due to patients tendency to fall back in to old habits.  - The patient admits there is a room for improvement in their diet and drink choices. -  Suggestion is made for the patient to avoid simple carbohydrates from their diet including Cakes, Sweet Desserts / Pastries, Ice Cream, Soda (diet and regular), Sweet Tea, Candies, Chips, Cookies, Sweet Pastries, Store Bought Juices, Alcohol in Excess of 1-2 drinks a day, Artificial Sweeteners, Coffee Creamer, and "Sugar-free" Products. This will help patient to have stable blood glucose profile and potentially avoid unintended weight gain.   - I encouraged the patient to switch to unprocessed or minimally processed complex starch and increased protein intake (animal or plant source), fruits, and vegetables.   - Patient is advised to stick to a routine mealtimes to eat 3 meals a day and avoid unnecessary snacks (to snack only to correct hypoglycemia).  - I have approached her with the following individualized plan to manage diabetes and patient agrees:   -She is advised to continue Trulicity 4.5 mg SQ weekly and stop her Glipizide for now given random hypoglycemia.  She is encouraged to eat a low fat diet and avoid over-indulging and avoid sweetened beverages as well.   -She is advised to continue monitoring blood glucose at least once  daily, before breakfast and  to call the clinic if she has readings less than 70 or above 200 for 3 tests in a row.  - Patient specific target  A1c;  LDL, HDL, Triglycerides  were discussed in detail.  2) BP/HTN:  -Her blood pressure is controlled to target without the use of antihypertensive medications.  3) Lipids/HPL:    Her most recent lipid panel from 02/02/22 shows controlled LDL of 36 and elevated triglycerides of 213 (improving).  She is advised to continue Atorvastatin 40 mg po daily at bedtime.  Side effects and precautions discussed with her.  She is advised to avoid fried foods and butter.  4)  Weight/Diet:  Her Body mass index is 34.54 kg/m.-she is a candidate for modest weight loss.  CDE Consult has been  initiated , exercise, and detailed carbohydrates information provided.  5) Hypothyroidism: -Diagnosed at approximate age of 42 years.  Her previsit thyroid function tests are consistent with over-replacement.  She is advised to continue Levothyroxine 75 mcg po daily before breakfast but skip 1 day out of the week.     - We discussed about the correct intake of her thyroid hormone, on empty stomach at fasting, with water, separated by at least 30 minutes from breakfast and other medications,  and separated by more than 4 hours from calcium, iron, multivitamins, acid reflux medications (PPIs). -Patient is made aware of the fact that thyroid hormone replacement is needed for life, dose to be adjusted by periodic monitoring of thyroid function tests.  6) Chronic Care/Health Maintenance: -she is on statin medications and is encouraged to continue to follow up with Ophthalmology, Dentist, Podiatrist at least yearly or according to recommendations, and advised to away from smoking.    I have recommended yearly flu vaccine and pneumonia vaccination at least every 5 years; moderate intensity exercise for up to 150 minutes weekly; and  sleep for at least 7 hours a day.  7) Vitamin D  deficiency Her most recent vitamin  D level was 23.7 on 09/02/22.  I refilled her Ergocalciferol 50000 units weekly.  She can also take the OTC Vitamin D3 5000 units daily to help boost her numbers.    - I advised patient to maintain close follow up with Dettinger, Elige Radon, MD for primary care needs.      I spent  38  minutes in the care of the patient today including review of labs from CMP, Lipids, Thyroid Function, Hematology (current and previous including abstractions from other facilities); face-to-face time discussing  her blood glucose readings/logs, discussing hypoglycemia and hyperglycemia episodes and symptoms, medications doses, her options of short and long term treatment based on the latest standards of care / guidelines;  discussion about incorporating lifestyle medicine;  and documenting the encounter. Risk reduction counseling performed per USPSTF guidelines to reduce obesity and cardiovascular risk factors.     Please refer to Patient Instructions for Blood Glucose Monitoring and Insulin/Medications Dosing Guide"  in media tab for additional information. Please  also refer to " Patient Self Inventory" in the Media  tab for reviewed elements of pertinent patient history.  Kallie Locks Sites participated in the discussions, expressed understanding, and voiced agreement with the above plans.  All questions were answered to her satisfaction. she is encouraged to contact clinic should she have any questions or concerns prior to her return visit.   Follow up plan: - Return for Diabetes F/U with A1c in office, Thyroid follow up, Previsit labs, Bring meter and logs.  Ronny Bacon,  FNP-BC [facility] [address] [place], [place] [address] Phone: [phone] Fax: [phone]  09/12/2022, 8:30 AM

## 2022-09-29 ENCOUNTER — Other Ambulatory Visit: Payer: Self-pay | Admitting: Nurse Practitioner

## 2022-09-29 ENCOUNTER — Other Ambulatory Visit: Payer: Self-pay | Admitting: Family Medicine

## 2022-09-29 DIAGNOSIS — E1142 Type 2 diabetes mellitus with diabetic polyneuropathy: Secondary | ICD-10-CM

## 2022-09-29 DIAGNOSIS — E785 Hyperlipidemia, unspecified: Secondary | ICD-10-CM

## 2022-09-30 ENCOUNTER — Encounter: Payer: Self-pay | Admitting: Family Medicine

## 2022-09-30 ENCOUNTER — Ambulatory Visit: Payer: Medicaid Other | Admitting: Family Medicine

## 2022-09-30 VITALS — BP 102/71 | HR 82 | Ht 61.0 in | Wt 178.0 lb

## 2022-09-30 DIAGNOSIS — E781 Pure hyperglyceridemia: Secondary | ICD-10-CM | POA: Diagnosis not present

## 2022-09-30 DIAGNOSIS — E782 Mixed hyperlipidemia: Secondary | ICD-10-CM | POA: Diagnosis not present

## 2022-09-30 DIAGNOSIS — E1122 Type 2 diabetes mellitus with diabetic chronic kidney disease: Secondary | ICD-10-CM

## 2022-09-30 DIAGNOSIS — E1142 Type 2 diabetes mellitus with diabetic polyneuropathy: Secondary | ICD-10-CM

## 2022-09-30 DIAGNOSIS — N1832 Chronic kidney disease, stage 3b: Secondary | ICD-10-CM

## 2022-09-30 DIAGNOSIS — E039 Hypothyroidism, unspecified: Secondary | ICD-10-CM

## 2022-09-30 DIAGNOSIS — I10 Essential (primary) hypertension: Secondary | ICD-10-CM

## 2022-09-30 NOTE — Progress Notes (Signed)
BP 102/71   Pulse 82   Ht 5\' 1"  (1.549 m)   Wt 178 lb (80.7 kg)   SpO2 97%   BMI 33.63 kg/m    Subjective:   Patient ID: Diane Morgan, female    DOB: May 20, 1970, 52 y.o.   MRN: [id number]  HPI: Diane Morgan is a 52 y.o. female presenting on 09/30/2022 for Medical Management of Chronic Issues, Diabetes, and Hypertension   HPI Type 2 diabetes mellitus Patient comes in today for recheck of his diabetes. Patient has been currently taking Trulicity. Patient is not currently on an ACE inhibitor/ARB. Patient has not seen an ophthalmologist this year. Patient denies any new issues with their feet. The symptom started onset as an adult hypertension and hypothyroidism and hyperlipidemia and CKD ARE RELATED TO DM   Hypertension Patient is currently on no medicine currently, and their blood pressure today is 102/71. Patient denies any lightheadedness or dizziness. Patient denies headaches, blurred vision, chest pains, shortness of breath, or weakness. Denies any side effects from medication and is content with current medication.   Hypothyroidism recheck Patient is coming in for thyroid recheck today as well. They deny any issues with hair changes or heat or cold problems or diarrhea or constipation. They deny any chest pain or palpitations. They are currently on levothyroxine 75 mcg micrograms   Hyperlipidemia Patient is coming in for recheck of his hyperlipidemia. The patient is currently taking fenofibrate and atorvastatin. They deny any issues with myalgias or history of liver damage from it. They deny any focal numbness or weakness or chest pain.   Relevant past medical, surgical, family and social history reviewed and updated as indicated. Interim medical history since our last visit reviewed. Allergies and medications reviewed and updated.  Review of Systems  Constitutional:  Negative for chills and fever.  HENT:  Negative for congestion, ear discharge and ear pain.   Eyes:   Negative for redness and visual disturbance.  Respiratory:  Negative for chest tightness and shortness of breath.   Cardiovascular:  Negative for chest pain and leg swelling.  Genitourinary:  Negative for difficulty urinating and dysuria.  Musculoskeletal:  Negative for back pain and gait problem.  Skin:  Negative for rash.  Neurological:  Negative for light-headedness and headaches.  Psychiatric/Behavioral:  Negative for agitation and behavioral problems.   All other systems reviewed and are negative.   Per HPI unless specifically indicated above   Allergies as of 09/30/2022       Reactions   Penicillins Anaphylaxis, Hives, Other (See Comments)   Childhood Reaction. Has patient had a PCN reaction causing immediate rash, facial/tongue/throat swelling, SOB or lightheadedness with hypotension: Yes Has patient had a PCN reaction causing severe rash involving mucus membranes or skin necrosis: No Has patient had a PCN reaction that required hospitalization: No Has patient had a PCN reaction occurring within the last 10 years: No If all of the above answers are "NO", then may proceed with Cephalosporin use.   Niaspan [niacin] Other (See Comments)   Patient passes out.        Medication List        Accurate as of September 30, 2022 10:46 AM. If you have any questions, ask your nurse or doctor.          Accu-Chek Aviva Plus w/Device Kit USE AS DIRECTED   Accu-Chek Guide test strip Generic drug: glucose blood USE 1  THREE TIMES DAILY AS DIRECTED E11.65   aspirin 81 MG  chewable tablet Chew 81 mg by mouth daily.   atorvastatin 40 MG tablet Commonly known as: LIPITOR Take 1 tablet (40 mg total) by mouth daily. (NEEDS TO BE SEEN BEFORE NEXT REFILL)   cetirizine 10 MG tablet Commonly known as: ZYRTEC Take 10 mg by mouth at bedtime.   cycloSPORINE 0.05 % ophthalmic emulsion Commonly known as: RESTASIS 1 drop 2 (two) times daily.   fenofibrate 145 MG tablet Commonly known as:  TRICOR Take 1 tablet by mouth once daily   gabapentin 300 MG capsule Commonly known as: NEURONTIN Take 1 capsule (300 mg total) by mouth at bedtime.   ibuprofen 200 MG tablet Commonly known as: ADVIL Take 400 mg by mouth every 6 (six) hours as needed for headache or moderate pain.   levothyroxine 75 MCG tablet Commonly known as: SYNTHROID Take 1 tablet (75 mcg total) by mouth daily before breakfast.   Trulicity 4.5 MG/0.5ML Sopn Generic drug: Dulaglutide Inject 4.5 mg into the skin once a week.   Vitamin D (Ergocalciferol) 1.25 MG (50000 UNIT) Caps capsule Commonly known as: DRISDOL Take 1 capsule by mouth once a week         Objective:   BP 102/71   Pulse 82   Ht 5\' 1"  (1.549 m)   Wt 178 lb (80.7 kg)   SpO2 97%   BMI 33.63 kg/m   Wt Readings from Last 3 Encounters:  09/30/22 178 lb (80.7 kg)  09/12/22 182 lb 12.8 oz (82.9 kg)  06/10/22 187 lb 12.8 oz (85.2 kg)    Physical Exam Vitals and nursing note reviewed.  Constitutional:      General: She is not in acute distress.    Appearance: She is well-developed. She is not diaphoretic.  Eyes:     Conjunctiva/sclera: Conjunctivae normal.     Pupils: Pupils are equal, round, and reactive to light.  Cardiovascular:     Rate and Rhythm: Normal rate and regular rhythm.     Heart sounds: Normal heart sounds. No murmur heard. Pulmonary:     Effort: Pulmonary effort is normal. No respiratory distress.     Breath sounds: Normal breath sounds. No wheezing.  Musculoskeletal:        General: No tenderness. Normal range of motion.  Skin:    General: Skin is warm and dry.     Findings: No rash.  Neurological:     Mental Status: She is alert and oriented to person, place, and time.     Coordination: Coordination normal.  Psychiatric:        Behavior: Behavior normal.       Assessment & Plan:   Problem List Items Addressed This Visit       Cardiovascular and Mediastinum   Hypertension - Primary   Relevant  Orders   BMP8+EGFR     Endocrine   Type 2 diabetes mellitus with stage 3 chronic kidney disease, without long-term current use of insulin (HCC)   Relevant Orders   BMP8+EGFR   Hypothyroidism   Type 2 diabetes mellitus with diabetic polyneuropathy (HCC)   Relevant Orders   BMP8+EGFR     Other   Hyperlipidemia   Hypertriglyceridemia  Patient's kidney function was slightly worse a month ago when the endocrinologist checked it and we will recheck that today.  Still stage III but slightly worse.  A1c and rest of blood work looked good with endocrinology.  Follow up plan: Return in about 6 months (around 04/02/2023), or if symptoms worsen or fail to  improve, for Hypertension and diabetes and CKD recheck.  Counseling provided for all of the vaccine components Orders Placed This Encounter  Procedures   BMP8+EGFR    Arville Care, MD [facility] 09/30/2022, 10:46 AM

## 2022-10-01 LAB — BMP8+EGFR
BUN/Creatinine Ratio: 12 (ref 9–23)
BUN: 19 mg/dL (ref 6–24)
CO2: 24 mmol/L (ref 20–29)
Calcium: 10.4 mg/dL — ABNORMAL HIGH (ref 8.7–10.2)
Chloride: 101 mmol/L (ref 96–106)
Creatinine, Ser: 1.61 mg/dL — ABNORMAL HIGH (ref 0.57–1.00)
Glucose: 115 mg/dL — ABNORMAL HIGH (ref 70–99)
Potassium: 5 mmol/L (ref 3.5–5.2)
Sodium: 138 mmol/L (ref 134–144)
eGFR: 39 mL/min/{1.73_m2} — ABNORMAL LOW (ref >59)

## 2022-10-21 ENCOUNTER — Other Ambulatory Visit: Payer: Self-pay | Admitting: Family Medicine

## 2022-11-28 ENCOUNTER — Other Ambulatory Visit: Payer: Self-pay | Admitting: Nurse Practitioner

## 2022-11-28 DIAGNOSIS — E1165 Type 2 diabetes mellitus with hyperglycemia: Secondary | ICD-10-CM

## 2022-11-30 ENCOUNTER — Telehealth: Payer: Self-pay | Admitting: Family Medicine

## 2022-12-09 ENCOUNTER — Other Ambulatory Visit: Payer: Self-pay | Admitting: Family Medicine

## 2022-12-09 DIAGNOSIS — E1142 Type 2 diabetes mellitus with diabetic polyneuropathy: Secondary | ICD-10-CM

## 2022-12-09 DIAGNOSIS — E785 Hyperlipidemia, unspecified: Secondary | ICD-10-CM

## 2022-12-09 DIAGNOSIS — I10 Essential (primary) hypertension: Secondary | ICD-10-CM

## 2023-01-02 ENCOUNTER — Other Ambulatory Visit: Payer: Medicaid Other

## 2023-01-02 DIAGNOSIS — E039 Hypothyroidism, unspecified: Secondary | ICD-10-CM | POA: Diagnosis not present

## 2023-01-03 LAB — TSH: TSH: 1.6 u[IU]/mL (ref 0.450–4.500)

## 2023-01-03 LAB — T4, FREE: Free T4: 1.56 ng/dL (ref 0.82–1.77)

## 2023-01-13 ENCOUNTER — Ambulatory Visit: Payer: Medicaid Other | Admitting: Nurse Practitioner

## 2023-01-13 ENCOUNTER — Encounter: Payer: Self-pay | Admitting: Nurse Practitioner

## 2023-01-13 VITALS — BP 110/72 | HR 77 | Ht 61.0 in | Wt 175.8 lb

## 2023-01-13 DIAGNOSIS — E039 Hypothyroidism, unspecified: Secondary | ICD-10-CM

## 2023-01-13 DIAGNOSIS — E1165 Type 2 diabetes mellitus with hyperglycemia: Secondary | ICD-10-CM | POA: Diagnosis not present

## 2023-01-13 DIAGNOSIS — E782 Mixed hyperlipidemia: Secondary | ICD-10-CM | POA: Diagnosis not present

## 2023-01-13 DIAGNOSIS — Z7985 Long-term (current) use of injectable non-insulin antidiabetic drugs: Secondary | ICD-10-CM

## 2023-01-13 DIAGNOSIS — I10 Essential (primary) hypertension: Secondary | ICD-10-CM | POA: Diagnosis not present

## 2023-01-13 DIAGNOSIS — E559 Vitamin D deficiency, unspecified: Secondary | ICD-10-CM

## 2023-01-13 LAB — POCT GLYCOSYLATED HEMOGLOBIN (HGB A1C): Hemoglobin A1C: 6.4 % — AB (ref 4.0–5.6)

## 2023-01-13 MED ORDER — TRULICITY 4.5 MG/0.5ML ~~LOC~~ SOAJ: 4.5000 mg | SUBCUTANEOUS | 3 refills | Status: DC

## 2023-01-13 MED ORDER — LEVOTHYROXINE SODIUM 75 MCG PO TABS: 75.0000 ug | ORAL_TABLET | Freq: Every day | ORAL | 3 refills | Status: DC

## 2023-01-13 NOTE — Progress Notes (Signed)
01/13/2023, 8:58 AM        Endocrinology follow-up note    Subjective:    Patient ID: Diane Morgan, female    DOB: May 13, 1970.  Gwyn L Ellenburg is being iseen in follow-up for management of currently controlled symptomatic type 2 diabetes, hypothyroidism, hyperlipidemia, hypertension. PMD:  Dettinger, Elige Radon, MD.    Past Medical History:  Diagnosis Date   Anemia    Arthritis    Cataract    right   Cellulitis and abscess of trunk 11/28/2012   Diabetic peripheral neuropathy (HCC)    DM (diabetes mellitus) (HCC)    Eczema    GERD (gastroesophageal reflux disease)    Hyperlipidemia    Hypertension    Hypothyroidism    Leg pain, bilateral 10/08/09   NIDDM (non-insulin dependent diabetes mellitus)    URI (upper respiratory infection) 07/01/10   Past Surgical History:  Procedure Laterality Date   CATARACT EXTRACTION W/PHACO Right 06/12/2017   Procedure: CATARACT EXTRACTION WITH PHACOEMULSIFICATION  AND INTRAOCULAR LENS PLACEMENT RIGHT EYE;  Surgeon: Gemma Payor, MD;  Location: [facility];  Service: Ophthalmology;  Laterality: Right;  CDE: 1.16   CHOLECYSTECTOMY     EYE SURGERY     right catracts   EYE SURGERY  12/2019   left cataract   LASER ABLATION CONDOLAMATA N/A 03/08/2017   Procedure: LASER ABLATION OF THE CERVIX;  Surgeon: Lazaro Arms, MD;  Location: [facility];  Service: Gynecology;  Laterality: N/A;   WISDOM TOOTH EXTRACTION     Social History   Socioeconomic History   Marital status: Single    Spouse name: Not on file   Number of children: Not on file   Years of education: Not on file   Highest education level: Not on file  Occupational History   Not on file  Tobacco Use   Smoking status: Every Day    Types: Cigarettes   Smokeless tobacco: Never   Tobacco comments:    smokes 4 cig daily  Vaping Use   Vaping status: Never Used  Substance and Sexual Activity   Alcohol use: No   Drug use: No   Sexual activity: Not Currently     Birth control/protection: None  Other Topics Concern   Not on file  Social History Narrative   Not on file   Social Determinants of Health   Financial Resource Strain: Not on file  Food Insecurity: Not on file  Transportation Needs: Not on file  Physical Activity: Not on file  Stress: Not on file  Social Connections: Unknown (08/02/2021)   [facility]   Social Network    Social Network: Not on file   Outpatient Encounter Medications as of 01/13/2023  Medication Sig   aspirin 81 MG chewable tablet Chew 81 mg by mouth daily.   atorvastatin (LIPITOR) 40 MG tablet Take 1 tablet (40 mg total) by mouth daily.   Blood Glucose Monitoring Suppl (ACCU-CHEK AVIVA PLUS) w/Device KIT USE AS DIRECTED   cetirizine (ZYRTEC) 10 MG tablet Take 10 mg by mouth at bedtime.    cycloSPORINE (RESTASIS) 0.05 % ophthalmic emulsion 1 drop 2 (two) times daily.   fenofibrate (TRICOR) 145 MG tablet Take 1 tablet by mouth once daily   gabapentin (NEURONTIN) 300 MG capsule Take 1 capsule by mouth at bedtime   glucose blood (ACCU-CHEK GUIDE) test strip USE 1  THREE TIMES DAILY AS DIRECTED E11.65   ibuprofen (ADVIL,MOTRIN) 200 MG  tablet Take 400 mg by mouth every 6 (six) hours as needed for headache or moderate pain.   Vitamin D, Ergocalciferol, (DRISDOL) 1.25 MG (50000 UNIT) CAPS capsule Take 1 capsule by mouth once a week   [DISCONTINUED] levothyroxine (SYNTHROID) 75 MCG tablet Take 1 tablet (75 mcg total) by mouth daily before breakfast.   [DISCONTINUED] TRULICITY 4.5 MG/0.5ML SOPN INJECT 4.5MG  SUBCUTANEOUSLY ONCE A WEEK   Dulaglutide (TRULICITY) 4.5 MG/0.5ML SOAJ Inject 4.5 mg into the skin once a week.   levothyroxine (SYNTHROID) 75 MCG tablet Take 1 tablet (75 mcg total) by mouth daily before breakfast.   No facility-administered encounter medications on file as of 01/13/2023.    ALLERGIES: Allergies  Allergen Reactions   Penicillins Anaphylaxis, Hives and Other (See  Comments)    Childhood Reaction. Has patient had a PCN reaction causing immediate rash, facial/tongue/throat swelling, SOB or lightheadedness with hypotension: Yes Has patient had a PCN reaction causing severe rash involving mucus membranes or skin necrosis: No Has patient had a PCN reaction that required hospitalization: No Has patient had a PCN reaction occurring within the last 10 years: No If all of the above answers are "NO", then may proceed with Cephalosporin use.    Niaspan [Niacin] Other (See Comments)    Patient passes out.    VACCINATION STATUS: Immunization History  Administered Date(s) Administered   Influenza,inj,Quad PF,6+ Mos 11/28/2012, 01/22/2014, 12/26/2014, 12/07/2015, 12/15/2016, 01/02/2018, 01/07/2019, 01/23/2020, 01/29/2021, 02/02/2022   PFIZER(Purple Top)SARS-COV-2 Vaccination 06/29/2019, 07/20/2019, 01/23/2020   PNEUMOCOCCAL CONJUGATE-20 02/02/2022   Pneumococcal Polysaccharide-23 01/07/2019   Tdap 03/19/2013   Diabetes She presents for her follow-up diabetic visit. She has type 2 diabetes mellitus. Onset time: Diagnosed at approx age of 36. Her disease course has been improving. There are no hypoglycemic associated symptoms. Pertinent negatives for hypoglycemia include no nervousness/anxiousness or tremors. There are no diabetic associated symptoms. Pertinent negatives for diabetes include no fatigue and no weight loss. There are no hypoglycemic complications. Symptoms are stable. Diabetic complications include nephropathy. Risk factors for coronary artery disease include diabetes mellitus, dyslipidemia, hypertension, obesity, sedentary lifestyle and tobacco exposure. Current diabetic treatments: Trulicity 4.5 SQ weekly. She is compliant with treatment all of the time. Her weight is fluctuating minimally. She is following a generally healthy diet. When asked about meal planning, she reported none. She has not had a previous visit with a dietitian. She rarely  participates in exercise. Her home blood glucose trend is decreasing steadily. Her breakfast blood glucose range is generally 110-130 mg/dl. Her overall blood glucose range is 110-130 mg/dl. (She presents today, accompanied by her mother, with her meter and logs showing at goal glycemic profile overall.  Her POCT A1c today is 6.4%, increasing slightly from last visit of 6%.  Analysis of her meter shows 7-day average of 120, 14-day average of 122, 30-day average of 125, 90-day average of 127.  She no longer experiences any hypoglycemia.) An ACE inhibitor/angiotensin II receptor blocker is not being taken. She does not see a podiatrist.Eye exam is current.  Thyroid Problem Presents for follow-up visit. Symptoms include weight gain. Patient reports no anxiety, cold intolerance, constipation, depressed mood, diarrhea, fatigue, heat intolerance, leg swelling, palpitations, tremors or weight loss. The symptoms have been stable. Her past medical history is significant for diabetes and hyperlipidemia.  Hyperlipidemia This is a chronic problem. The current episode started more than 1 year ago. The problem is uncontrolled. Recent lipid tests were reviewed and are variable. Exacerbating diseases include chronic renal disease, diabetes, hypothyroidism and  obesity. Factors aggravating her hyperlipidemia include fatty foods. Current antihyperlipidemic treatment includes statins and fibric acid derivatives. The current treatment provides moderate improvement of lipids. There are no compliance problems.  Risk factors for coronary artery disease include diabetes mellitus, dyslipidemia, hypertension, obesity and a sedentary lifestyle.   Review of systems  Constitutional: + decreasing body weight,  current Body mass index is 33.22 kg/m. , no fatigue, no subjective hyperthermia, no subjective hypothermia Eyes: no blurry vision, no xerophthalmia ENT: no sore throat, no nodules palpated in throat, no dysphagia/odynophagia, no  hoarseness Cardiovascular: no chest pain, no shortness of breath, no palpitations, no leg swelling Respiratory: no cough, no shortness of breath Gastrointestinal: no nausea/vomiting/diarrhea Musculoskeletal: no muscle/joint aches Skin: no rashes, no hyperemia Neurological: no tremors, no numbness, no tingling, no dizziness Psychiatric: no depression, no anxiety   Objective:    BP 110/72 (BP Location: Right Arm, Patient Position: Sitting, Cuff Size: Large)   Pulse 77   Ht 5\' 1"  (1.549 m)   Wt 175 lb 12.8 oz (79.7 kg)   BMI 33.22 kg/m   Wt Readings from Last 3 Encounters:  01/13/23 175 lb 12.8 oz (79.7 kg)  09/30/22 178 lb (80.7 kg)  09/12/22 182 lb 12.8 oz (82.9 kg)    BP Readings from Last 3 Encounters:  01/13/23 110/72  09/30/22 102/71  09/12/22 108/70     Physical Exam- Limited  Constitutional:  Body mass index is 33.22 kg/m. , not in acute distress, normal state of mind Eyes:  EOMI, no exophthalmos Musculoskeletal: no gross deformities, strength intact in all four extremities, no gross restriction of joint movements Skin:  no rashes, no hyperemia Neurological: no tremor with outstretched hands   Diabetic Foot Exam - Simple   No data filed      CMP     Component Value Date/Time   NA 138 09/30/2022 1101   K 5.0 09/30/2022 1101   CL 101 09/30/2022 1101   CO2 24 09/30/2022 1101   GLUCOSE 115 (H) 09/30/2022 1101   GLUCOSE 438 (H) 06/06/2017 0811   BUN 19 09/30/2022 1101   CREATININE 1.61 (H) 09/30/2022 1101   CREATININE 1.13 (H) 08/17/2012 1031   CALCIUM 10.4 (H) 09/30/2022 1101   PROT 6.0 09/02/2022 0837   ALBUMIN 4.0 09/02/2022 0837   AST 29 09/02/2022 0837   ALT 23 09/02/2022 0837   ALKPHOS 66 09/02/2022 0837   BILITOT 0.5 09/02/2022 0837   GFRNONAA 50 (L) 01/23/2020 1126   GFRNONAA 61 08/17/2012 1031   GFRAA 58 (L) 01/23/2020 1126   GFRAA 70 08/17/2012 1031     Diabetic Labs (most recent): Lab Results  Component Value Date   HGBA1C 6.4 (A)  01/13/2023   HGBA1C 6.0 (A) 09/12/2022   HGBA1C 7.8 (A) 06/10/2022   MICROALBUR 30 mg/L 12/03/2021   MICROALBUR 150 10/09/2020   MICROALBUR 20 11/14/2014     Lipid Panel ( most recent) Lipid Panel     Component Value Date/Time   CHOL 99 (L) 02/02/2022 1107   CHOL 85 08/17/2012 1031   TRIG 213 (H) 02/02/2022 1107   TRIG 161 (H) 08/17/2012 1031   HDL 29 (L) 02/02/2022 1107   HDL 26 (L) 08/17/2012 1031   CHOLHDL 3.4 02/02/2022 1107   CHOLHDL 10.3 11/29/2012 1030   VLDL 39 11/29/2012 1030   LDLCALC 36 02/02/2022 1107   LDLCALC 27 08/17/2012 1031     Lab Results  Component Value Date   TSH 1.600 01/02/2023   TSH 0.682 09/02/2022  TSH 0.871 02/02/2022   TSH 1.160 07/30/2021   TSH 1.010 01/04/2021   TSH 0.671 06/29/2020   TSH 1.080 01/23/2020   TSH 1.320 07/26/2019   TSH 0.619 04/22/2019   TSH 0.477 01/25/2019   FREET4 1.56 01/02/2023   FREET4 2.11 (H) 09/02/2022   FREET4 1.52 02/02/2022   FREET4 1.77 07/30/2021   FREET4 1.57 01/04/2021   FREET4 2.22 (H) 06/29/2020   FREET4 1.38 01/23/2020   FREET4 1.91 (H) 04/22/2019   FREET4 1.69 10/15/2018   FREET4 1.43 04/16/2018     Latest Reference Range & Units 01/04/21 09:15 07/30/21 09:27 07/30/21 09:36 02/02/22 11:07 09/02/22 08:37 01/02/23 08:34  TSH 0.450 - 4.500 uIU/mL 1.010 1.160  0.871 0.682 1.600  T4,Free(Direct) 0.82 - 1.77 ng/dL 0.86  5.78 4.69 6.29 (H) 1.56  Thyroxine (T4) 4.5 - 12.0 ug/dL  9.7      Free Thyroxine Index 1.2 - 4.9   3.2      T3 Uptake Ratio 24 - 39 %  33      (H): Data is abnormally high  Assessment & Plan:   1) Type 2 diabetes mellitus with stage 3 chronic kidney disease, without long-term current use of insulin (HCC)  - Jodilyn L Callicott has currently controlled type 2 DM since 52 years of age.  She presents today, accompanied by her mother, with her meter and logs showing at goal glycemic profile overall.  Her POCT A1c today is 6.4%, increasing slightly from last visit of 6%.  Analysis of her  meter shows 7-day average of 120, 14-day average of 122, 30-day average of 125, 90-day average of 127.  She no longer experiences any hypoglycemia.  -her diabetes is complicated by obesity/sedentary life, CKD, smoking, and Sage L Libman remains at a high risk for more acute and chronic complications which include CAD, CVA, CKD, retinopathy, and neuropathy. These are all discussed in detail with the patient.  - Nutritional counseling repeated at each appointment due to patients tendency to fall back in to old habits.  - The patient admits there is a room for improvement in their diet and drink choices. -  Suggestion is made for the patient to avoid simple carbohydrates from their diet including Cakes, Sweet Desserts / Pastries, Ice Cream, Soda (diet and regular), Sweet Tea, Candies, Chips, Cookies, Sweet Pastries, Store Bought Juices, Alcohol in Excess of 1-2 drinks a day, Artificial Sweeteners, Coffee Creamer, and "Sugar-free" Products. This will help patient to have stable blood glucose profile and potentially avoid unintended weight gain.   - I encouraged the patient to switch to unprocessed or minimally processed complex starch and increased protein intake (animal or plant source), fruits, and vegetables.   - Patient is advised to stick to a routine mealtimes to eat 3 meals a day and avoid unnecessary snacks (to snack only to correct hypoglycemia).  - I have approached her with the following individualized plan to manage diabetes and patient agrees:   -She is advised to continue Trulicity 4.5 mg SQ weekly.  -She is advised to continue monitoring blood glucose at least once daily 2-3 times per week during the holiday season to help keep her on track, before breakfast and to call the clinic if she has readings less than 70 or above 200 for 3 tests in a row.  - Patient specific target  A1c;  LDL, HDL, Triglycerides  were discussed in detail.  2) BP/HTN:  -Her blood pressure is controlled to  target without the use of antihypertensive  medications.  3) Lipids/HPL:    Her most recent lipid panel from 02/02/22 shows controlled LDL of 36 and elevated triglycerides of 213 (improving).  She is advised to continue Atorvastatin 40 mg po daily at bedtime.  Side effects and precautions discussed with her.  She is advised to avoid fried foods and butter.  4)  Weight/Diet:  Her Body mass index is 33.22 kg/m.-she is a candidate for modest weight loss.  CDE Consult has been  initiated , exercise, and detailed carbohydrates information provided.  5) Hypothyroidism: -Diagnosed at approximate age of 42 years.  Her previsit thyroid function tests are consistent with appropriate hormone replacement.  She is advised to continue Levothyroxine 75 mcg po daily before breakfast but skip 1 day out of the week.     - We discussed about the correct intake of her thyroid hormone, on empty stomach at fasting, with water, separated by at least 30 minutes from breakfast and other medications,  and separated by more than 4 hours from calcium, iron, multivitamins, acid reflux medications (PPIs). -Patient is made aware of the fact that thyroid hormone replacement is needed for life, dose to be adjusted by periodic monitoring of thyroid function tests.  6) Chronic Care/Health Maintenance: -she is on statin medications and is encouraged to continue to follow up with Ophthalmology, Dentist, Podiatrist at least yearly or according to recommendations, and advised to away from smoking.    I have recommended yearly flu vaccine and pneumonia vaccination at least every 5 years; moderate intensity exercise for up to 150 minutes weekly; and  sleep for at least 7 hours a day.  7) Vitamin D deficiency Her most recent vitamin  D level was 23.7 on 09/02/22.  I refilled her Ergocalciferol 50000 units weekly.  She can also take the OTC Vitamin D3 5000 units daily to help boost her numbers.    - I advised patient to maintain close  follow up with Dettinger, Elige Radon, MD for primary care needs.     I spent  40  minutes in the care of the patient today including review of labs from CMP, Lipids, Thyroid Function, Hematology (current and previous including abstractions from other facilities); face-to-face time discussing  her blood glucose readings/logs, discussing hypoglycemia and hyperglycemia episodes and symptoms, medications doses, her options of short and long term treatment based on the latest standards of care / guidelines;  discussion about incorporating lifestyle medicine;  and documenting the encounter. Risk reduction counseling performed per USPSTF guidelines to reduce obesity and cardiovascular risk factors.     Please refer to Patient Instructions for Blood Glucose Monitoring and Insulin/Medications Dosing Guide"  in media tab for additional information. Please  also refer to " Patient Self Inventory" in the Media  tab for reviewed elements of pertinent patient history.  Kallie Locks Faircloth participated in the discussions, expressed understanding, and voiced agreement with the above plans.  All questions were answered to her satisfaction. she is encouraged to contact clinic should she have any questions or concerns prior to her return visit.   Follow up plan: - Return in about 4 months (around 05/16/2023) for Diabetes F/U with A1c in office, Thyroid follow up, Previsit labs.  Ronny Bacon, [facility] [address] [place], [place] [address] Phone: [phone] Fax: [phone]  01/13/2023, 8:58 AM

## 2023-01-13 NOTE — Patient Instructions (Signed)

## 2023-02-16 ENCOUNTER — Other Ambulatory Visit: Payer: Self-pay | Admitting: Nurse Practitioner

## 2023-02-16 ENCOUNTER — Other Ambulatory Visit: Payer: Self-pay | Admitting: Family Medicine

## 2023-02-20 ENCOUNTER — Other Ambulatory Visit: Payer: Self-pay | Admitting: *Deleted

## 2023-02-20 DIAGNOSIS — E1165 Type 2 diabetes mellitus with hyperglycemia: Secondary | ICD-10-CM

## 2023-02-20 MED ORDER — TRULICITY 4.5 MG/0.5ML ~~LOC~~ SOAJ: 4.5000 mg | SUBCUTANEOUS | 3 refills | Status: DC

## 2023-03-03 ENCOUNTER — Other Ambulatory Visit: Payer: Self-pay | Admitting: Nurse Practitioner

## 2023-03-03 ENCOUNTER — Other Ambulatory Visit: Payer: Self-pay | Admitting: Family Medicine

## 2023-03-03 DIAGNOSIS — I10 Essential (primary) hypertension: Secondary | ICD-10-CM

## 2023-03-03 DIAGNOSIS — E785 Hyperlipidemia, unspecified: Secondary | ICD-10-CM

## 2023-03-03 DIAGNOSIS — E1142 Type 2 diabetes mellitus with diabetic polyneuropathy: Secondary | ICD-10-CM

## 2023-03-09 ENCOUNTER — Other Ambulatory Visit: Payer: Self-pay

## 2023-03-09 MED ORDER — LEVOTHYROXINE SODIUM 75 MCG PO TABS: 75.0000 ug | ORAL_TABLET | Freq: Every day | ORAL | 1 refills | Status: DC

## 2023-03-27 ENCOUNTER — Ambulatory Visit: Payer: Medicaid Other | Admitting: Family Medicine

## 2023-04-17 ENCOUNTER — Ambulatory Visit: Payer: Medicaid Other | Admitting: Family Medicine

## 2023-04-17 ENCOUNTER — Encounter: Payer: Self-pay | Admitting: Family Medicine

## 2023-04-17 VITALS — BP 118/79 | HR 81 | Ht 61.0 in | Wt 168.0 lb

## 2023-04-17 DIAGNOSIS — Z7985 Long-term (current) use of injectable non-insulin antidiabetic drugs: Secondary | ICD-10-CM

## 2023-04-17 DIAGNOSIS — E785 Hyperlipidemia, unspecified: Secondary | ICD-10-CM

## 2023-04-17 DIAGNOSIS — Z23 Encounter for immunization: Secondary | ICD-10-CM | POA: Diagnosis not present

## 2023-04-17 DIAGNOSIS — E782 Mixed hyperlipidemia: Secondary | ICD-10-CM

## 2023-04-17 DIAGNOSIS — E1122 Type 2 diabetes mellitus with diabetic chronic kidney disease: Secondary | ICD-10-CM

## 2023-04-17 DIAGNOSIS — E039 Hypothyroidism, unspecified: Secondary | ICD-10-CM

## 2023-04-17 DIAGNOSIS — N1832 Chronic kidney disease, stage 3b: Secondary | ICD-10-CM | POA: Diagnosis not present

## 2023-04-17 DIAGNOSIS — E1142 Type 2 diabetes mellitus with diabetic polyneuropathy: Secondary | ICD-10-CM | POA: Diagnosis not present

## 2023-04-17 DIAGNOSIS — E1165 Type 2 diabetes mellitus with hyperglycemia: Secondary | ICD-10-CM | POA: Diagnosis not present

## 2023-04-17 DIAGNOSIS — E781 Pure hyperglyceridemia: Secondary | ICD-10-CM | POA: Diagnosis not present

## 2023-04-17 DIAGNOSIS — I10 Essential (primary) hypertension: Secondary | ICD-10-CM | POA: Diagnosis not present

## 2023-04-17 LAB — LIPID PANEL

## 2023-04-17 LAB — BAYER DCA HB A1C WAIVED: HB A1C (BAYER DCA - WAIVED): 6.1 % — ABNORMAL HIGH (ref 4.8–5.6)

## 2023-04-17 MED ORDER — FENOFIBRATE 145 MG PO TABS: 145.0000 mg | ORAL_TABLET | Freq: Every day | ORAL | 1 refills | Status: DC

## 2023-04-17 MED ORDER — ATORVASTATIN CALCIUM 40 MG PO TABS: 40.0000 mg | ORAL_TABLET | Freq: Every day | ORAL | 1 refills | Status: DC

## 2023-04-17 MED ORDER — GABAPENTIN 300 MG PO CAPS: 300.0000 mg | ORAL_CAPSULE | Freq: Every day | ORAL | 1 refills | Status: DC

## 2023-04-17 NOTE — Progress Notes (Signed)
BP 118/79   Pulse 81   Ht 5\' 1"  (1.549 m)   Wt 168 lb (76.2 kg)   SpO2 100%   BMI 31.74 kg/m    Subjective:   Patient ID: Diane Morgan, female    DOB: 1970-06-04, 53 y.o.   MRN: [id number]  HPI: Diane Morgan is a 53 y.o. female presenting on 04/17/2023 for Medical Management of Chronic Issues, Hypertension, and Diabetes  Type 2 diabetes mellitus Patient comes in today for recheck of her diabetes. Patient is currently taking Trulicity. Fasting blood sugar ranges between 110-120 at home. Patient has not seen an ophthalmologist this year. Patient denies any new issues with her feet. Her diabetes is complicated by hyperlipidemia.  Hypertension Patient is not currently taking any medications. Her blood pressure today is 118/79. She denies lightheadedness or dizziness, headaches, vision changes, chest pain, or shortness of breath.    Hypothyroidism recheck Patient is currently taking levothyroxine 75 mcg. She denies any changes in hair or skin, heat or cold intolerance, diarrhea or constipation, and chest pain or palpitations.   Hyperlipidemia Patient is currently taking atorvastatin and fenofibrate. She denies myalgias or weakness. She does not have a history of liver damage from it.   Patient also reports episodes of sweating after eating food (not liquids). The flushing occurs after every meal and is not related to the intake of spicy or hot foods. She denies abdominal pain, heartburn, nausea, vomiting, or lightheadedness during these episodes. She is peri-menopausal and reports the episodes feel similar to a hot flash except they are predictable.  Relevant past medical, surgical, family and social history reviewed and updated as indicated. Interim medical history since our last visit reviewed. Allergies and medications reviewed and updated.  Review of Systems  Constitutional:  Negative for chills and fever.  HENT:  Negative for congestion and sore throat.   Eyes:  Negative for  visual disturbance.  Respiratory:  Negative for chest tightness and shortness of breath.   Cardiovascular:  Negative for chest pain, palpitations and leg swelling.  Gastrointestinal:  Negative for abdominal distention, abdominal pain, constipation, diarrhea, nausea and vomiting.  Endocrine: Negative for cold intolerance and heat intolerance.  Genitourinary:  Negative for difficulty urinating, dysuria and hematuria.  Musculoskeletal:  Negative for myalgias.  Neurological:  Negative for dizziness, syncope, weakness, light-headedness and headaches.    Per HPI unless specifically indicated above   Allergies as of 04/17/2023       Reactions   Penicillins Anaphylaxis, Hives, Other (See Comments)   Childhood Reaction. Has patient had a PCN reaction causing immediate rash, facial/tongue/throat swelling, SOB or lightheadedness with hypotension: Yes Has patient had a PCN reaction causing severe rash involving mucus membranes or skin necrosis: No Has patient had a PCN reaction that required hospitalization: No Has patient had a PCN reaction occurring within the last 10 years: No If all of the above answers are "NO", then may proceed with Cephalosporin use.   Niaspan [niacin] Other (See Comments)   Patient passes out.        Medication List        Accurate as of April 17, 2023 11:57 AM. If you have any questions, ask your nurse or doctor.          Accu-Chek Aviva Plus w/Device Kit USE AS DIRECTED   Accu-Chek Guide Test test strip Generic drug: glucose blood Use to check blood glucose daily as directed.   aspirin 81 MG chewable tablet Chew 81 mg by mouth  daily.   atorvastatin 40 MG tablet Commonly known as: LIPITOR Take 1 tablet (40 mg total) by mouth daily.   cetirizine 10 MG tablet Commonly known as: ZYRTEC Take 10 mg by mouth at bedtime.   cycloSPORINE 0.05 % ophthalmic emulsion Commonly known as: RESTASIS 1 drop 2 (two) times daily.   fenofibrate 145 MG  tablet Commonly known as: TRICOR Take 1 tablet (145 mg total) by mouth daily.   gabapentin 300 MG capsule Commonly known as: NEURONTIN Take 1 capsule (300 mg total) by mouth at bedtime.   ibuprofen 200 MG tablet Commonly known as: ADVIL Take 400 mg by mouth every 6 (six) hours as needed for headache or moderate pain.   levothyroxine 75 MCG tablet Commonly known as: SYNTHROID Take 1 tablet (75 mcg total) by mouth daily before breakfast.   Trulicity 4.5 MG/0.5ML Soaj Generic drug: Dulaglutide Inject 4.5 mg into the skin once a week.   Vitamin D (Ergocalciferol) 1.25 MG (50000 UNIT) Caps capsule Commonly known as: DRISDOL Take 1 capsule by mouth once a week         Objective:   BP 118/79   Pulse 81   Ht 5\' 1"  (1.549 m)   Wt 168 lb (76.2 kg)   SpO2 100%   BMI 31.74 kg/m   Wt Readings from Last 3 Encounters:  04/17/23 168 lb (76.2 kg)  01/13/23 175 lb 12.8 oz (79.7 kg)  09/30/22 178 lb (80.7 kg)    Physical Exam Vitals and nursing note reviewed.  Constitutional:      Appearance: Normal appearance. She is obese.  HENT:     Head: Normocephalic and atraumatic.     Right Ear: External ear normal.     Left Ear: External ear normal.  Eyes:     Conjunctiva/sclera: Conjunctivae normal.  Cardiovascular:     Rate and Rhythm: Normal rate and regular rhythm.     Heart sounds: Normal heart sounds.  Pulmonary:     Effort: Pulmonary effort is normal.     Breath sounds: Normal breath sounds. No wheezing or rales.  Abdominal:     General: Abdomen is flat. There is no distension.     Palpations: Abdomen is soft.     Tenderness: There is no abdominal tenderness. There is no right CVA tenderness or left CVA tenderness.  Musculoskeletal:     Cervical back: Normal range of motion and neck supple. No tenderness.     Right lower leg: No edema.     Left lower leg: No edema.  Skin:    General: Skin is warm and dry.  Neurological:     Mental Status: She is alert and oriented to  person, place, and time.  Psychiatric:        Mood and Affect: Mood normal.        Behavior: Behavior normal.        Thought Content: Thought content normal.        Judgment: Judgment normal.   Assessment & Plan:   Problem List Items Addressed This Visit       Cardiovascular and Mediastinum   Hypertension   Relevant Medications   atorvastatin (LIPITOR) 40 MG tablet   fenofibrate (TRICOR) 145 MG tablet     Endocrine   Type 2 diabetes mellitus with stage 3 chronic kidney disease, without long-term current use of insulin (HCC) - Primary   Relevant Medications   atorvastatin (LIPITOR) 40 MG tablet   fenofibrate (TRICOR) 145 MG tablet   Hypothyroidism  Type 2 diabetes mellitus with diabetic polyneuropathy (HCC)   Relevant Medications   atorvastatin (LIPITOR) 40 MG tablet   fenofibrate (TRICOR) 145 MG tablet   gabapentin (NEURONTIN) 300 MG capsule   Other Relevant Orders   CBC with Differential/Platelet   CMP14+EGFR   TSH   Bayer DCA Hb A1c Waived     Other   Hyperlipidemia   Relevant Medications   atorvastatin (LIPITOR) 40 MG tablet   fenofibrate (TRICOR) 145 MG tablet   Hypertriglyceridemia   Relevant Medications   atorvastatin (LIPITOR) 40 MG tablet   fenofibrate (TRICOR) 145 MG tablet   Other Visit Diagnoses       Dyslipidemia       Relevant Medications   atorvastatin (LIPITOR) 40 MG tablet   fenofibrate (TRICOR) 145 MG tablet   Other Relevant Orders   CBC with Differential/Platelet   CMP14+EGFR   Lipid panel   TSH     Essential hypertension       Relevant Medications   atorvastatin (LIPITOR) 40 MG tablet   fenofibrate (TRICOR) 145 MG tablet   Other Relevant Orders   CBC with Differential/Platelet   CMP14+EGFR   Lipid panel   TSH      Blood pressure is well-controlled without medications at 118/79. Diabetes well-controlled on Trulicity with HgbA1c 6.1%. Patient denies any symptoms of hyperthyroidism or hypothyroidism, but will recheck TSH today. Will  also recheck lipid panel today. Recommended patient keep a log of flushing episodes and any correlation with certain foods. This could be related to Trulicity or peri-menopause, but the log will help further differentiate.  Follow up plan: Return in 6 months (on 10/15/2023), or if symptoms worsen or fail to improve, for Diabetes hypertension thyroid.  Counseling provided for all of the vaccine components Orders Placed This Encounter  Procedures   CBC with Differential/Platelet   CMP14+EGFR   Lipid panel   TSH   Bayer DCA Hb A1c Waived    Gillermina Phy, Medical Student [facility] 04/17/2023, 11:57 AM  I was personally present for all components of the history, physical exam and/or medical decision making.  I agree with the documentation performed by the student and agree with assessment and plan above.  Arville Care, MD [facility] 04/21/2023, 12:16 PM

## 2023-04-18 LAB — TSH
TSH: 0.759 u[IU]/mL (ref 0.450–4.500)
TSH: 0.75 u[IU]/mL (ref 0.450–4.500)

## 2023-04-18 LAB — COMPREHENSIVE METABOLIC PANEL
ALT: 12 [IU]/L (ref 0–32)
AST: 21 [IU]/L (ref 0–40)
Albumin: 3.9 g/dL (ref 3.9–4.9)
Alkaline Phosphatase: 67 [IU]/L (ref 44–121)
BUN/Creatinine Ratio: 12 (ref 9–23)
BUN: 18 mg/dL (ref 6–24)
Bilirubin Total: 0.5 mg/dL (ref 0.0–1.2)
CO2: 22 mmol/L (ref 20–29)
Calcium: 10.2 mg/dL (ref 8.7–10.2)
Chloride: 104 mmol/L (ref 96–106)
Creatinine, Ser: 1.51 mg/dL — ABNORMAL HIGH (ref 0.57–1.00)
Globulin, Total: 1.9 g/dL (ref 1.5–4.5)
Glucose: 112 mg/dL — ABNORMAL HIGH (ref 70–99)
Potassium: 5.5 mmol/L — ABNORMAL HIGH (ref 3.5–5.2)
Sodium: 138 mmol/L (ref 134–144)
Total Protein: 5.8 g/dL — ABNORMAL LOW (ref 6.0–8.5)
eGFR: 42 mL/min/{1.73_m2} — ABNORMAL LOW (ref >59)

## 2023-04-18 LAB — LIPID PANEL
Cholesterol, Total: 102 mg/dL (ref 100–199)
HDL: 34 mg/dL — ABNORMAL LOW (ref >39)
LDL CALC COMMENT:: 3 ratio (ref 0.0–4.4)
LDL Chol Calc (NIH): 43 mg/dL (ref 0–99)
Triglycerides: 147 mg/dL (ref 0–149)
VLDL Cholesterol Cal: 25 mg/dL (ref 5–40)

## 2023-04-18 LAB — CMP14+EGFR
ALT: 16 IU/L (ref 0–32)
AST: 24 IU/L (ref 0–40)
Albumin: 3.8 g/dL — ABNORMAL LOW (ref 3.9–4.9)
Alkaline Phosphatase: 68 IU/L (ref 44–121)
BUN/Creatinine Ratio: 13 (ref 9–23)
BUN: 19 mg/dL (ref 6–24)
Bilirubin Total: 0.5 mg/dL (ref 0.0–1.2)
CO2: 20 mmol/L (ref 20–29)
Calcium: 10.2 mg/dL (ref 8.7–10.2)
Chloride: 103 mmol/L (ref 96–106)
Creatinine, Ser: 1.52 mg/dL — ABNORMAL HIGH (ref 0.57–1.00)
Globulin, Total: 2.2 g/dL (ref 1.5–4.5)
Glucose: 113 mg/dL — ABNORMAL HIGH (ref 70–99)
Potassium: 5 mmol/L (ref 3.5–5.2)
Sodium: 139 mmol/L (ref 134–144)
Total Protein: 6 g/dL (ref 6.0–8.5)
eGFR: 42 mL/min/{1.73_m2} — ABNORMAL LOW (ref >59)

## 2023-04-18 LAB — CBC WITH DIFFERENTIAL/PLATELET
Basophils Absolute: 0.1 10*3/uL (ref 0.0–0.2)
Basos: 1 %
EOS (ABSOLUTE): 0.2 10*3/uL (ref 0.0–0.4)
Eos: 4 %
Hematocrit: 42.8 % (ref 34.0–46.6)
Hemoglobin: 14.2 g/dL (ref 11.1–15.9)
Immature Grans (Abs): 0 10*3/uL (ref 0.0–0.1)
Immature Granulocytes: 0 %
Lymphocytes Absolute: 1.6 10*3/uL (ref 0.7–3.1)
Lymphs: 25 %
MCH: 29.2 pg (ref 26.6–33.0)
MCHC: 33.2 g/dL (ref 31.5–35.7)
MCV: 88 fL (ref 79–97)
Monocytes Absolute: 0.5 10*3/uL (ref 0.1–0.9)
Monocytes: 7 %
Neutrophils Absolute: 4.1 10*3/uL (ref 1.4–7.0)
Neutrophils: 63 %
Platelets: 247 10*3/uL (ref 150–450)
RBC: 4.86 x10E6/uL (ref 3.77–5.28)
RDW: 13 % (ref 11.7–15.4)
WBC: 6.5 10*3/uL (ref 3.4–10.8)

## 2023-04-18 LAB — T4, FREE: Free T4: 1.62 ng/dL (ref 0.82–1.77)

## 2023-04-18 LAB — MICROALBUMIN / CREATININE URINE RATIO
Creatinine, Urine: 89.7 mg/dL
Microalb/Creat Ratio: 11 mg/g{creat} (ref 0–29)
Microalbumin, Urine: 9.9 ug/mL

## 2023-04-21 ENCOUNTER — Encounter: Payer: Self-pay | Admitting: Family Medicine

## 2023-04-26 ENCOUNTER — Encounter: Payer: Self-pay | Admitting: Family Medicine

## 2023-05-09 ENCOUNTER — Telehealth: Payer: Self-pay | Admitting: Family Medicine

## 2023-05-09 NOTE — Telephone Encounter (Signed)
 Copied from CRM [id number]. Topic: General - Other >> May 09, 2023  9:16 AM Elle L wrote: Reason for CRM: The patient's Mother, Cordella Nyquist, Ezzie has a mental impairement and Lottie states that she is her payee and caregiver for SSI. She wants to make Dr. Louanne Skye aware but does not want it listed on the patient's MyChart. She is requesting a call back from Dr. Louanne Skye or his nurse at [phone] if possible.

## 2023-05-10 NOTE — Telephone Encounter (Signed)
 Okay thanks for letting us know, we had our office do not necessarily have anything to do with the legal side of Social Security but it would probably also be good if they wanted to make sure they spoke with a lawyer and got power of attorney and healthcare power of attorney in place as well.

## 2023-05-10 NOTE — Telephone Encounter (Signed)
 Mother of pt made aware. Needs documentation in chart that at every visit that she attends.

## 2023-05-17 ENCOUNTER — Other Ambulatory Visit: Payer: Self-pay | Admitting: Nurse Practitioner

## 2023-05-19 ENCOUNTER — Ambulatory Visit: Payer: Medicaid Other | Admitting: Nurse Practitioner

## 2023-05-19 ENCOUNTER — Encounter: Payer: Self-pay | Admitting: Nurse Practitioner

## 2023-05-19 VITALS — BP 102/60 | HR 93 | Ht 61.0 in | Wt 166.2 lb

## 2023-05-19 DIAGNOSIS — E1122 Type 2 diabetes mellitus with diabetic chronic kidney disease: Secondary | ICD-10-CM | POA: Diagnosis not present

## 2023-05-19 DIAGNOSIS — E559 Vitamin D deficiency, unspecified: Secondary | ICD-10-CM | POA: Diagnosis not present

## 2023-05-19 DIAGNOSIS — N1832 Chronic kidney disease, stage 3b: Secondary | ICD-10-CM | POA: Diagnosis not present

## 2023-05-19 DIAGNOSIS — Z7985 Long-term (current) use of injectable non-insulin antidiabetic drugs: Secondary | ICD-10-CM

## 2023-05-19 DIAGNOSIS — E039 Hypothyroidism, unspecified: Secondary | ICD-10-CM | POA: Diagnosis not present

## 2023-05-19 DIAGNOSIS — E1165 Type 2 diabetes mellitus with hyperglycemia: Secondary | ICD-10-CM

## 2023-05-19 DIAGNOSIS — E782 Mixed hyperlipidemia: Secondary | ICD-10-CM

## 2023-05-19 DIAGNOSIS — I10 Essential (primary) hypertension: Secondary | ICD-10-CM

## 2023-05-19 MED ORDER — LEVOTHYROXINE SODIUM 75 MCG PO TABS: 75.0000 ug | ORAL_TABLET | Freq: Every day | ORAL | 1 refills | Status: AC

## 2023-05-19 MED ORDER — TRULICITY 4.5 MG/0.5ML ~~LOC~~ SOAJ: 4.5000 mg | SUBCUTANEOUS | 3 refills | Status: AC

## 2023-05-19 NOTE — Progress Notes (Signed)
 05/19/2023, 8:16 AM        Endocrinology follow-up note    Subjective:    Patient ID: Diane Morgan, female    DOB: May 20, 1970.  Diane Morgan is being iseen in follow-up for management of currently controlled symptomatic type 2 diabetes, hypothyroidism, hyperlipidemia, hypertension. PMD:  Dettinger, Elige Radon, MD.    Past Medical History:  Diagnosis Date   Anemia    Arthritis    Cataract    right   Cellulitis and abscess of trunk 11/28/2012   Diabetic peripheral neuropathy (HCC)    DM (diabetes mellitus) (HCC)    Eczema    GERD (gastroesophageal reflux disease)    Hyperlipidemia    Hypertension    Hypothyroidism    Leg pain, bilateral 10/08/09   NIDDM (non-insulin dependent diabetes mellitus)    URI (upper respiratory infection) 07/01/10   Past Surgical History:  Procedure Laterality Date   CATARACT EXTRACTION W/PHACO Right 06/12/2017   Procedure: CATARACT EXTRACTION WITH PHACOEMULSIFICATION  AND INTRAOCULAR LENS PLACEMENT RIGHT EYE;  Surgeon: Gemma Payor, MD;  Location: [facility];  Service: Ophthalmology;  Laterality: Right;  CDE: 1.16   CHOLECYSTECTOMY     EYE SURGERY     right catracts   EYE SURGERY  12/2019   left cataract   LASER ABLATION CONDOLAMATA N/A 03/08/2017   Procedure: LASER ABLATION OF THE CERVIX;  Surgeon: Lazaro Arms, MD;  Location: [facility];  Service: Gynecology;  Laterality: N/A;   WISDOM TOOTH EXTRACTION     Social History   Socioeconomic History   Marital status: Single    Spouse name: Not on file   Number of children: Not on file   Years of education: Not on file   Highest education level: Not on file  Occupational History   Not on file  Tobacco Use   Smoking status: Every Day    Types: Cigarettes   Smokeless tobacco: Never   Tobacco comments:    smokes 4 cig daily  Vaping Use   Vaping status: Never Used  Substance and Sexual Activity   Alcohol use: No   Drug use: No   Sexual activity: Not Currently     Birth control/protection: None  Other Topics Concern   Not on file  Social History Narrative   Not on file   Social Drivers of Health   Financial Resource Strain: Not on file  Food Insecurity: Not on file  Transportation Needs: Not on file  Physical Activity: Not on file  Stress: Not on file  Social Connections: Unknown (08/02/2021)   [facility]   Social Network    Social Network: Not on file   Outpatient Encounter Medications as of 05/19/2023  Medication Sig   aspirin 81 MG chewable tablet Chew 81 mg by mouth daily.   atorvastatin (LIPITOR) 40 MG tablet Take 1 tablet (40 mg total) by mouth daily.   Blood Glucose Monitoring Suppl (ACCU-CHEK AVIVA PLUS) w/Device KIT USE AS DIRECTED   cetirizine (ZYRTEC) 10 MG tablet Take 10 mg by mouth at bedtime.    cycloSPORINE (RESTASIS) 0.05 % ophthalmic emulsion 1 drop 2 (two) times daily.   Dulaglutide (TRULICITY) 4.5 MG/0.5ML SOAJ Inject 4.5 mg into the skin once a week.   fenofibrate (TRICOR) 145 MG tablet Take 1 tablet (145 mg total) by mouth daily.   gabapentin (NEURONTIN) 300 MG capsule Take 1 capsule (300 mg total) by mouth at bedtime.  glucose blood (ACCU-CHEK GUIDE TEST) test strip Use to check blood glucose daily as directed.   ibuprofen (ADVIL,MOTRIN) 200 MG tablet Take 400 mg by mouth every 6 (six) hours as needed for headache or moderate pain.   levothyroxine (SYNTHROID) 75 MCG tablet Take 1 tablet (75 mcg total) by mouth daily before breakfast.   Vitamin D, Ergocalciferol, (DRISDOL) 1.25 MG (50000 UNIT) CAPS capsule Take 1 capsule by mouth once a week   [DISCONTINUED] Vitamin D, Ergocalciferol, (DRISDOL) 1.25 MG (50000 UNIT) CAPS capsule Take 1 capsule by mouth once a week   No facility-administered encounter medications on file as of 05/19/2023.    ALLERGIES: Allergies  Allergen Reactions   Penicillins Anaphylaxis, Hives and Other (See Comments)    Childhood Reaction. Has patient had a PCN  reaction causing immediate rash, facial/tongue/throat swelling, SOB or lightheadedness with hypotension: Yes Has patient had a PCN reaction causing severe rash involving mucus membranes or skin necrosis: No Has patient had a PCN reaction that required hospitalization: No Has patient had a PCN reaction occurring within the last 10 years: No If all of the above answers are "NO", then may proceed with Cephalosporin use.    Niaspan [Niacin] Other (See Comments)    Patient passes out.    VACCINATION STATUS: Immunization History  Administered Date(s) Administered   Influenza, Seasonal, Injecte, Preservative Fre 04/17/2023   Influenza,inj,Quad PF,6+ Mos 11/28/2012, 01/22/2014, 12/26/2014, 12/07/2015, 12/15/2016, 01/02/2018, 01/07/2019, 01/23/2020, 01/29/2021, 02/02/2022   PFIZER(Purple Top)SARS-COV-2 Vaccination 06/29/2019, 07/20/2019, 01/23/2020   PNEUMOCOCCAL CONJUGATE-20 02/02/2022   Pneumococcal Polysaccharide-23 01/07/2019   Tdap 03/19/2013, 04/17/2023   Diabetes She presents for her follow-up diabetic visit. She has type 2 diabetes mellitus. Onset time: Diagnosed at approx age of 42. Her disease course has been improving. There are no hypoglycemic associated symptoms. Pertinent negatives for hypoglycemia include no nervousness/anxiousness or tremors. There are no diabetic associated symptoms. Pertinent negatives for diabetes include no fatigue and no weight loss. There are no hypoglycemic complications. Symptoms are stable. Diabetic complications include nephropathy. Risk factors for coronary artery disease include diabetes mellitus, dyslipidemia, hypertension, obesity, sedentary lifestyle and tobacco exposure. Current diabetic treatments: Trulicity 4.5 SQ weekly. She is compliant with treatment all of the time. Her weight is fluctuating minimally. She is following a generally healthy diet. When asked about meal planning, she reported none. She has not had a previous visit with a dietitian. She  rarely participates in exercise. Her home blood glucose trend is decreasing steadily. Her breakfast blood glucose range is generally 110-130 mg/dl. Her overall blood glucose range is 110-130 mg/dl. (She presents today, accompanied by her mother, with her meter and logs showing at goal glycemic profile overall.  Her previsit A1c on 1/27 was 6.1%, improving from last visit of 6.4%.  Analysis of her meter shows 7-day average of 1115, 14-day average of 115, 30-day average of 118, 90-day average of 118.  ) An ACE inhibitor/angiotensin II receptor blocker is not being taken. She does not see a podiatrist.Eye exam is current.  Thyroid Problem Presents for follow-up visit. Symptoms include weight gain. Patient reports no anxiety, cold intolerance, constipation, depressed mood, diarrhea, fatigue, heat intolerance, leg swelling, palpitations, tremors or weight loss. The symptoms have been stable. Her past medical history is significant for diabetes.  Hyperlipidemia This is a chronic problem. The current episode started more than 1 year ago. The problem is uncontrolled. Recent lipid tests were reviewed and are variable. Exacerbating diseases include chronic renal disease, diabetes, hypothyroidism and obesity. Factors aggravating her  hyperlipidemia include fatty foods. Current antihyperlipidemic treatment includes statins and fibric acid derivatives. The current treatment provides moderate improvement of lipids. There are no compliance problems.  Risk factors for coronary artery disease include diabetes mellitus, dyslipidemia, hypertension, obesity and a sedentary lifestyle.   Review of systems  Constitutional: + decreasing body weight,  current Body mass index is 31.4 kg/m. , no fatigue, no subjective hyperthermia, no subjective hypothermia Eyes: no blurry vision, no xerophthalmia ENT: no sore throat, no nodules palpated in throat, no dysphagia/odynophagia, no hoarseness Cardiovascular: no chest pain, no shortness  of breath, no palpitations, no leg swelling Respiratory: no cough, no shortness of breath Gastrointestinal: no nausea/vomiting/diarrhea Musculoskeletal: no muscle/joint aches Skin: no rashes, no hyperemia Neurological: no tremors, no numbness, no tingling, no dizziness Psychiatric: no depression, no anxiety   Objective:    Ht 5\' 1"  (1.549 m)   Wt 166 lb 3.2 oz (75.4 kg)   BMI 31.40 kg/m   Wt Readings from Last 3 Encounters:  05/19/23 166 lb 3.2 oz (75.4 kg)  04/17/23 168 lb (76.2 kg)  01/13/23 175 lb 12.8 oz (79.7 kg)    BP Readings from Last 3 Encounters:  04/17/23 118/79  01/13/23 110/72  09/30/22 102/71     Physical Exam- Limited  Constitutional:  Body mass index is 31.4 kg/m. , not in acute distress, normal state of mind Eyes:  EOMI, no exophthalmos Musculoskeletal: no gross deformities, strength intact in all four extremities, no gross restriction of joint movements Skin:  no rashes, no hyperemia Neurological: no tremor with outstretched hands   Diabetic Foot Exam - Simple   No data filed      CMP     Component Value Date/Time   NA 139 04/17/2023 1121   K 5.0 04/17/2023 1121   CL 103 04/17/2023 1121   CO2 20 04/17/2023 1121   GLUCOSE 113 (H) 04/17/2023 1121   GLUCOSE 438 (H) 06/06/2017 0811   BUN 19 04/17/2023 1121   CREATININE 1.52 (H) 04/17/2023 1121   CREATININE 1.13 (H) 08/17/2012 1031   CALCIUM 10.2 04/17/2023 1121   PROT 6.0 04/17/2023 1121   ALBUMIN 3.8 (L) 04/17/2023 1121   AST 24 04/17/2023 1121   ALT 16 04/17/2023 1121   ALKPHOS 68 04/17/2023 1121   BILITOT 0.5 04/17/2023 1121   GFRNONAA 50 (L) 01/23/2020 1126   GFRNONAA 61 08/17/2012 1031   GFRAA 58 (L) 01/23/2020 1126   GFRAA 70 08/17/2012 1031     Diabetic Labs (most recent): Lab Results  Component Value Date   HGBA1C 6.1 (H) 04/17/2023   HGBA1C 6.4 (A) 01/13/2023   HGBA1C 6.0 (A) 09/12/2022   MICROALBUR 30 mg/L 12/03/2021   MICROALBUR 150 10/09/2020   MICROALBUR 20  11/14/2014     Lipid Panel ( most recent) Lipid Panel     Component Value Date/Time   CHOL 102 04/17/2023 1121   CHOL 85 08/17/2012 1031   TRIG 147 04/17/2023 1121   TRIG 161 (H) 08/17/2012 1031   HDL 34 (L) 04/17/2023 1121   HDL 26 (L) 08/17/2012 1031   CHOLHDL 3.0 04/17/2023 1121   CHOLHDL 10.3 11/29/2012 1030   VLDL 39 11/29/2012 1030   LDLCALC 43 04/17/2023 1121   LDLCALC 27 08/17/2012 1031     Lab Results  Component Value Date   TSH 0.759 04/17/2023   TSH 0.750 04/17/2023   TSH 1.600 01/02/2023   TSH 0.682 09/02/2022   TSH 0.871 02/02/2022   TSH 1.160 07/30/2021   TSH 1.010  01/04/2021   TSH 0.671 06/29/2020   TSH 1.080 01/23/2020   TSH 1.320 07/26/2019   FREET4 1.62 04/17/2023   FREET4 1.56 01/02/2023   FREET4 2.11 (H) 09/02/2022   FREET4 1.52 02/02/2022   FREET4 1.77 07/30/2021   FREET4 1.57 01/04/2021   FREET4 2.22 (H) 06/29/2020   FREET4 1.38 01/23/2020   FREET4 1.91 (H) 04/22/2019   FREET4 1.69 10/15/2018     Latest Reference Range & Units 01/04/21 09:15 07/30/21 09:27 07/30/21 09:36 02/02/22 11:07 09/02/22 08:37 01/02/23 08:34  TSH 0.450 - 4.500 uIU/mL 1.010 1.160  0.871 0.682 1.600  T4,Free(Direct) 0.82 - 1.77 ng/dL 4.09  8.11 9.14 7.82 (H) 1.56  Thyroxine (T4) 4.5 - 12.0 ug/dL  9.7      Free Thyroxine Index 1.2 - 4.9   3.2      T3 Uptake Ratio 24 - 39 %  33      (H): Data is abnormally high  Assessment & Plan:   1) Type 2 diabetes mellitus with stage 3 chronic kidney disease, without long-term current use of insulin (HCC)  - Diane Morgan has currently controlled type 2 DM since 53 years of age.  She presents today, accompanied by her mother, with her meter and logs showing at goal glycemic profile overall.  Her previsit A1c on 1/27 was 6.1%, improving from last visit of 6.4%.  Analysis of her meter shows 7-day average of 1115, 14-day average of 115, 30-day average of 118, 90-day average of 118.    -her diabetes is complicated by  obesity/sedentary life, CKD, smoking, and Diane Morgan remains at a high risk for more acute and chronic complications which include CAD, CVA, CKD, retinopathy, and neuropathy. These are all discussed in detail with the patient.  - Nutritional counseling repeated at each appointment due to patients tendency to fall back in to old habits.  - The patient admits there is a room for improvement in their diet and drink choices. -  Suggestion is made for the patient to avoid simple carbohydrates from their diet including Cakes, Sweet Desserts / Pastries, Ice Cream, Soda (diet and regular), Sweet Tea, Candies, Chips, Cookies, Sweet Pastries, Store Bought Juices, Alcohol in Excess of 1-2 drinks a day, Artificial Sweeteners, Coffee Creamer, and "Sugar-free" Products. This will help patient to have stable blood glucose profile and potentially avoid unintended weight gain.   - I encouraged the patient to switch to unprocessed or minimally processed complex starch and increased protein intake (animal or plant source), fruits, and vegetables.   - Patient is advised to stick to a routine mealtimes to eat 3 meals a day and avoid unnecessary snacks (to snack only to correct hypoglycemia).  - I have approached her with the following individualized plan to manage diabetes and patient agrees:   -Based on her stable glycemic profile, no changes were made to her medications today.  She is advised to continue Trulicity 4.5 mg SQ weekly.  -She is advised to continue monitoring blood glucose at least once daily 2-3 times per week during the holiday season to help keep her on track, before breakfast and to call the clinic if she has readings less than 70 or above 200 for 3 tests in a row.  - Patient specific target  A1c;  LDL, HDL, Triglycerides  were discussed in detail.  2) BP/HTN:  -Her blood pressure is controlled to target without the use of antihypertensive medications.  3) Lipids/HPL:    Her most recent  lipid panel  from 04/17/23 shows controlled LDL of 43.  She is advised to continue Atorvastatin 40 mg po daily at bedtime.  Side effects and precautions discussed with her.  She is advised to avoid fried foods and butter.  4)  Weight/Diet:  Her Body mass index is 31.4 kg/m.-she is a candidate for modest weight loss.  CDE Consult has been  initiated , exercise, and detailed carbohydrates information provided.  5) Hypothyroidism: -Diagnosed at approximate age of 42 years.  Her previsit thyroid function tests are consistent with appropriate hormone replacement.  She is advised to continue Levothyroxine 75 mcg po daily before breakfast but skip 1 day out of the week.   Will recheck prior to next visit and adjust dose accordingly.    - We discussed about the correct intake of her thyroid hormone, on empty stomach at fasting, with water, separated by at least 30 minutes from breakfast and other medications,  and separated by more than 4 hours from calcium, iron, multivitamins, acid reflux medications (PPIs). -Patient is made aware of the fact that thyroid hormone replacement is needed for life, dose to be adjusted by periodic monitoring of thyroid function tests.  6) Chronic Care/Health Maintenance: -she is on statin medications and is encouraged to continue to follow up with Ophthalmology, Dentist, Podiatrist at least yearly or according to recommendations, and advised to away from smoking.    I have recommended yearly flu vaccine and pneumonia vaccination at least every 5 years; moderate intensity exercise for up to 150 minutes weekly; and  sleep for at least 7 hours a day.  7) Vitamin D deficiency Her most recent vitamin  D level was 23.7 on 09/02/22.  I refilled her Ergocalciferol 50000 units weekly.  She can also take the OTC Vitamin D3 5000 units daily to help boost her numbers.    - I advised patient to maintain close follow up with Dettinger, Elige Radon, MD for primary care needs.     I spent   20  minutes in the care of the patient today including review of labs from CMP, Lipids, Thyroid Function, Hematology (current and previous including abstractions from other facilities); face-to-face time discussing  her blood glucose readings/logs, discussing hypoglycemia and hyperglycemia episodes and symptoms, medications doses, her options of short and long term treatment based on the latest standards of care / guidelines;  discussion about incorporating lifestyle medicine;  and documenting the encounter. Risk reduction counseling performed per USPSTF guidelines to reduce obesity and cardiovascular risk factors.     Please refer to Patient Instructions for Blood Glucose Monitoring and Insulin/Medications Dosing Guide"  in media tab for additional information. Please  also refer to " Patient Self Inventory" in the Media  tab for reviewed elements of pertinent patient history.  Kallie Locks Delossantos participated in the discussions, expressed understanding, and voiced agreement with the above plans.  All questions were answered to her satisfaction. she is encouraged to contact clinic should she have any questions or concerns prior to her return visit.   Follow up plan: - No follow-ups on file.  Ronny Bacon, [facility] [address] [place], [place] [address] Phone: [phone] Fax: [phone]  05/19/2023, 8:16 AM

## 2023-07-02 ENCOUNTER — Other Ambulatory Visit: Payer: Self-pay | Admitting: Nurse Practitioner

## 2023-09-05 ENCOUNTER — Other Ambulatory Visit: Payer: Self-pay | Admitting: Family Medicine

## 2023-09-05 DIAGNOSIS — I10 Essential (primary) hypertension: Secondary | ICD-10-CM

## 2023-09-05 DIAGNOSIS — E785 Hyperlipidemia, unspecified: Secondary | ICD-10-CM

## 2023-09-05 DIAGNOSIS — E1142 Type 2 diabetes mellitus with diabetic polyneuropathy: Secondary | ICD-10-CM

## 2023-09-21 ENCOUNTER — Other Ambulatory Visit: Payer: Self-pay | Admitting: Family Medicine

## 2023-09-21 ENCOUNTER — Other Ambulatory Visit: Payer: Self-pay | Admitting: Nurse Practitioner

## 2023-09-21 DIAGNOSIS — E785 Hyperlipidemia, unspecified: Secondary | ICD-10-CM

## 2023-09-21 DIAGNOSIS — I10 Essential (primary) hypertension: Secondary | ICD-10-CM

## 2023-09-21 DIAGNOSIS — E1142 Type 2 diabetes mellitus with diabetic polyneuropathy: Secondary | ICD-10-CM

## 2023-10-11 ENCOUNTER — Telehealth: Payer: Self-pay | Admitting: *Deleted

## 2023-10-11 NOTE — Telephone Encounter (Signed)
 Patient's mother left a message , asking if the patient may have her lab work drawn for [facility]  at her appointment at [facility] on Monday, July 28,2025. Patient appointment with [facility] is in September.

## 2023-10-11 NOTE — Telephone Encounter (Signed)
 Yes, that is fine.

## 2023-10-11 NOTE — Telephone Encounter (Signed)
 Patient called and made aware. Orders faxed to [facility].

## 2023-10-16 ENCOUNTER — Ambulatory Visit: Payer: Medicaid Other | Admitting: Family Medicine

## 2023-10-16 ENCOUNTER — Encounter: Payer: Self-pay | Admitting: Family Medicine

## 2023-10-16 VITALS — BP 122/74 | HR 77 | Ht 61.0 in | Wt 165.0 lb

## 2023-10-16 DIAGNOSIS — N1832 Chronic kidney disease, stage 3b: Secondary | ICD-10-CM

## 2023-10-16 DIAGNOSIS — Z7985 Long-term (current) use of injectable non-insulin antidiabetic drugs: Secondary | ICD-10-CM | POA: Diagnosis not present

## 2023-10-16 DIAGNOSIS — I10 Essential (primary) hypertension: Secondary | ICD-10-CM

## 2023-10-16 DIAGNOSIS — E1142 Type 2 diabetes mellitus with diabetic polyneuropathy: Secondary | ICD-10-CM

## 2023-10-16 DIAGNOSIS — E039 Hypothyroidism, unspecified: Secondary | ICD-10-CM | POA: Diagnosis not present

## 2023-10-16 DIAGNOSIS — Z1211 Encounter for screening for malignant neoplasm of colon: Secondary | ICD-10-CM

## 2023-10-16 DIAGNOSIS — E1122 Type 2 diabetes mellitus with diabetic chronic kidney disease: Secondary | ICD-10-CM | POA: Diagnosis not present

## 2023-10-16 DIAGNOSIS — E781 Pure hyperglyceridemia: Secondary | ICD-10-CM | POA: Diagnosis not present

## 2023-10-16 DIAGNOSIS — E785 Hyperlipidemia, unspecified: Secondary | ICD-10-CM | POA: Diagnosis not present

## 2023-10-16 DIAGNOSIS — E1169 Type 2 diabetes mellitus with other specified complication: Secondary | ICD-10-CM | POA: Diagnosis not present

## 2023-10-16 LAB — BAYER DCA HB A1C WAIVED: HB A1C (BAYER DCA - WAIVED): 5.6 % (ref 4.8–5.6)

## 2023-10-16 NOTE — Progress Notes (Signed)
 BP 122/74   Pulse 77   Ht 5' 1 (1.549 m)   Wt 165 lb (74.8 kg)   SpO2 98%   BMI 31.18 kg/m    Subjective:   Patient ID: Diane Morgan, female    DOB: 25-Aug-1970, 53 y.o.   MRN: [id number]  HPI: Diane Morgan is a 53 y.o. female presenting on 10/16/2023 for Medical Management of Chronic Issues, Diabetes, Chronic Kidney Disease, Hypothyroidism, Hypertension, and Hyperlipidemia   HPI Type 2 diabetes mellitus Patient comes in today for recheck of his diabetes. Patient has been currently taking Trulicity . Patient is not currently on an ACE inhibitor/ARB. Patient has not seen an ophthalmologist this year. Patient denies any new issues with their feet. The symptom started onset as an adult hyperlipidemia and hypertriglyceridemia and hypertension and CKD 3 and neuropathy and hypothyroidism ARE RELATED TO DM   Hypertension Patient is currently on no medication currently, diet control, and their blood pressure today is 122/74. Patient denies any lightheadedness or dizziness. Patient denies headaches, blurred vision, chest pains, shortness of breath, or weakness. Denies any side effects from medication and is content with current medication.   Hyperlipidemia and hypertriglyceridemia Patient is coming in for recheck of his hyperlipidemia. The patient is currently taking fenofibrate  and atorvastatin . They deny any issues with myalgias or history of liver damage from it. They deny any focal numbness or weakness or chest pain.   Hypothyroidism recheck Patient is coming in for thyroid  recheck today as well. They deny any issues with hair changes or heat or cold problems or diarrhea or constipation. They deny any chest pain or palpitations. They are currently on levothyroxine  75 micrograms   Relevant past medical, surgical, family and social history reviewed and updated as indicated. Interim medical history since our last visit reviewed. Allergies and medications reviewed and updated.  Review of  Systems  Constitutional:  Negative for chills and fever.  Eyes:  Negative for redness and visual disturbance.  Respiratory:  Negative for chest tightness and shortness of breath.   Cardiovascular:  Negative for chest pain and leg swelling.  Musculoskeletal:  Negative for back pain and gait problem.  Skin:  Negative for rash.  Neurological:  Negative for dizziness, light-headedness and headaches.  Psychiatric/Behavioral:  Negative for agitation and behavioral problems.   All other systems reviewed and are negative.   Per HPI unless specifically indicated above   Allergies as of 10/16/2023       Reactions   Penicillins Anaphylaxis, Hives, Other (See Comments)   Childhood Reaction. Has patient had a PCN reaction causing immediate rash, facial/tongue/throat swelling, SOB or lightheadedness with hypotension: Yes Has patient had a PCN reaction causing severe rash involving mucus membranes or skin necrosis: No Has patient had a PCN reaction that required hospitalization: No Has patient had a PCN reaction occurring within the last 10 years: No If all of the above answers are NO, then may proceed with Cephalosporin use.   Niaspan [niacin] Other (See Comments)   Patient passes out.        Medication List        Accurate as of October 16, 2023 10:02 AM. If you have any questions, ask your nurse or doctor.          Accu-Chek Aviva Plus w/Device Kit USE AS DIRECTED   Accu-Chek Guide Test test strip Generic drug: glucose blood Use to check blood glucose daily as directed.   aspirin  81 MG chewable tablet Chew 81 mg by mouth  daily.   atorvastatin  40 MG tablet Commonly known as: LIPITOR Take 1 tablet by mouth once daily   cetirizine 10 MG tablet Commonly known as: ZYRTEC Take 10 mg by mouth at bedtime.   cycloSPORINE 0.05 % ophthalmic emulsion Commonly known as: RESTASIS 1 drop 2 (two) times daily.   DAYQUIL MULTI-SYMPTOM COLD/FLU PO Take by mouth.   fenofibrate  145 MG  tablet Commonly known as: TRICOR  Take 1 tablet by mouth once daily   gabapentin  300 MG capsule Commonly known as: NEURONTIN  Take 1 capsule (300 mg total) by mouth at bedtime.   ibuprofen  200 MG tablet Commonly known as: ADVIL  Take 400 mg by mouth every 6 (six) hours as needed for headache or moderate pain.   levothyroxine  75 MCG tablet Commonly known as: SYNTHROID  Take 1 tablet (75 mcg total) by mouth daily before breakfast.   NYQUIL COLD & FLU PO Take by mouth.   Trulicity  4.5 MG/0.5ML Soaj Generic drug: Dulaglutide  Inject 4.5 mg into the skin once a week.   Vitamin D  (Ergocalciferol ) 1.25 MG (50000 UNIT) Caps capsule Commonly known as: DRISDOL  Take 1 capsule by mouth once a week         Objective:   BP 122/74   Pulse 77   Ht 5' 1 (1.549 m)   Wt 165 lb (74.8 kg)   SpO2 98%   BMI 31.18 kg/m   Wt Readings from Last 3 Encounters:  10/16/23 165 lb (74.8 kg)  05/19/23 166 lb 3.2 oz (75.4 kg)  04/17/23 168 lb (76.2 kg)    Physical Exam Vitals and nursing note reviewed.  Constitutional:      General: She is not in acute distress.    Appearance: She is well-developed. She is not diaphoretic.  Eyes:     Conjunctiva/sclera: Conjunctivae normal.  Cardiovascular:     Rate and Rhythm: Normal rate and regular rhythm.     Heart sounds: Normal heart sounds. No murmur heard. Pulmonary:     Effort: Pulmonary effort is normal. No respiratory distress.     Breath sounds: Normal breath sounds. No wheezing or rhonchi.  Musculoskeletal:        General: No swelling.  Skin:    General: Skin is warm and dry.     Findings: No rash.  Neurological:     Mental Status: She is alert and oriented to person, place, and time.     Coordination: Coordination normal.  Psychiatric:        Behavior: Behavior normal.       Assessment & Plan:   Problem List Items Addressed This Visit       Cardiovascular and Mediastinum   Hypertension   Relevant Orders   Bayer DCA Hb A1c  Waived   CBC with Differential/Platelet   CMP14+EGFR   Lipid panel     Endocrine   Hyperlipidemia associated with type 2 diabetes mellitus (HCC)   Type 2 diabetes mellitus with stage 3 chronic kidney disease, without long-term current use of insulin  (HCC) - Primary   Relevant Orders   Bayer DCA Hb A1c Waived   CBC with Differential/Platelet   CMP14+EGFR   Lipid panel   Hypothyroidism   Relevant Orders   Thyroid  Panel With TSH   Type 2 diabetes mellitus with diabetic polyneuropathy (HCC)     Other   Hypertriglyceridemia   Other Visit Diagnoses       Essential hypertension       Relevant Orders   Bayer DCA Hb A1c Waived  CBC with Differential/Platelet   CMP14+EGFR   Lipid panel     Colon cancer screening       Relevant Orders   Cologuard     A1c looks good at 5.6, blood pressure and everything else looks good.  Patient seems to be doing very well.  No major changes.  She will do the Cologuard for colon cancer screening  Follow up plan: Return in about 3 months (around 01/16/2024), or if symptoms worsen or fail to improve, for Diabetes with hypertension and hyperlipidemia.  Counseling provided for all of the vaccine components Orders Placed This Encounter  Procedures   Bayer DCA Hb A1c Waived   CBC with Differential/Platelet   CMP14+EGFR   Lipid panel   Thyroid  Panel With TSH   Cologuard    Fonda Levins, MD [facility] 10/16/2023, 10:02 AM

## 2023-10-17 LAB — CBC WITH DIFFERENTIAL/PLATELET
Basophils Absolute: 0.1 x10E3/uL (ref 0.0–0.2)
Basos: 1 %
EOS (ABSOLUTE): 0.2 x10E3/uL (ref 0.0–0.4)
Eos: 4 %
Hematocrit: 42.6 % (ref 34.0–46.6)
Hemoglobin: 14.1 g/dL (ref 11.1–15.9)
Immature Grans (Abs): 0 x10E3/uL (ref 0.0–0.1)
Immature Granulocytes: 0 %
Lymphocytes Absolute: 1.4 x10E3/uL (ref 0.7–3.1)
Lymphs: 24 %
MCH: 29.6 pg (ref 26.6–33.0)
MCHC: 33.1 g/dL (ref 31.5–35.7)
MCV: 90 fL (ref 79–97)
Monocytes Absolute: 0.5 x10E3/uL (ref 0.1–0.9)
Monocytes: 8 %
Neutrophils Absolute: 3.6 x10E3/uL (ref 1.4–7.0)
Neutrophils: 62 %
Platelets: 259 x10E3/uL (ref 150–450)
RBC: 4.76 x10E6/uL (ref 3.77–5.28)
RDW: 13.4 % (ref 11.7–15.4)
WBC: 5.8 x10E3/uL (ref 3.4–10.8)

## 2023-10-17 LAB — CMP14+EGFR
ALT: 14 IU/L (ref 0–32)
AST: 19 IU/L (ref 0–40)
Albumin: 3.7 g/dL — ABNORMAL LOW (ref 3.8–4.9)
Alkaline Phosphatase: 68 IU/L (ref 44–121)
BUN/Creatinine Ratio: 10 (ref 9–23)
BUN: 18 mg/dL (ref 6–24)
Bilirubin Total: 0.4 mg/dL (ref 0.0–1.2)
CO2: 19 mmol/L — ABNORMAL LOW (ref 20–29)
Calcium: 9.4 mg/dL (ref 8.7–10.2)
Chloride: 102 mmol/L (ref 96–106)
Creatinine, Ser: 1.77 mg/dL — ABNORMAL HIGH (ref 0.57–1.00)
Globulin, Total: 1.8 g/dL (ref 1.5–4.5)
Glucose: 99 mg/dL (ref 70–99)
Potassium: 5.1 mmol/L (ref 3.5–5.2)
Sodium: 134 mmol/L (ref 134–144)
Total Protein: 5.5 g/dL — ABNORMAL LOW (ref 6.0–8.5)
eGFR: 34 mL/min/1.73 — ABNORMAL LOW (ref >59)

## 2023-10-17 LAB — COMPREHENSIVE METABOLIC PANEL WITH GFR
ALT: 13 IU/L (ref 0–32)
AST: 20 IU/L (ref 0–40)
Albumin: 3.6 g/dL — ABNORMAL LOW (ref 3.8–4.9)
Alkaline Phosphatase: 66 IU/L (ref 44–121)
BUN/Creatinine Ratio: 10 (ref 9–23)
BUN: 18 mg/dL (ref 6–24)
Bilirubin Total: 0.5 mg/dL (ref 0.0–1.2)
CO2: 19 mmol/L — ABNORMAL LOW (ref 20–29)
Calcium: 9.5 mg/dL (ref 8.7–10.2)
Chloride: 99 mmol/L (ref 96–106)
Creatinine, Ser: 1.74 mg/dL — ABNORMAL HIGH (ref 0.57–1.00)
Globulin, Total: 2 g/dL (ref 1.5–4.5)
Glucose: 97 mg/dL (ref 70–99)
Potassium: 5.1 mmol/L (ref 3.5–5.2)
Sodium: 134 mmol/L (ref 134–144)
Total Protein: 5.6 g/dL — ABNORMAL LOW (ref 6.0–8.5)
eGFR: 35 mL/min/1.73 — ABNORMAL LOW (ref >59)

## 2023-10-17 LAB — LIPID PANEL
Chol/HDL Ratio: 3.5 ratio (ref 0.0–4.4)
Cholesterol, Total: 92 mg/dL — ABNORMAL LOW (ref 100–199)
HDL: 26 mg/dL — ABNORMAL LOW (ref >39)
LDL Chol Calc (NIH): 41 mg/dL (ref 0–99)
Triglycerides: 144 mg/dL (ref 0–149)
VLDL Cholesterol Cal: 25 mg/dL (ref 5–40)

## 2023-10-17 LAB — THYROID PANEL WITH TSH
Free Thyroxine Index: 2.8 (ref 1.2–4.9)
T3 Uptake Ratio: 28 (ref 24–39)
T4, Total: 10.1 ug/dL (ref 4.5–12.0)
TSH: 0.91 u[IU]/mL (ref 0.450–4.500)

## 2023-10-17 LAB — TSH: TSH: 0.948 u[IU]/mL (ref 0.450–4.500)

## 2023-10-17 LAB — T4, FREE: Free T4: 1.72 ng/dL (ref 0.82–1.77)

## 2023-10-23 ENCOUNTER — Ambulatory Visit: Payer: Self-pay | Admitting: Family Medicine

## 2023-10-26 ENCOUNTER — Telehealth: Payer: Self-pay | Admitting: Family Medicine

## 2023-10-26 NOTE — Telephone Encounter (Signed)
 I called mom and left her a detailed message to call back to go over patients lab results.   Copied from CRM [id number]. Topic: Clinical - Lab/Test Results >> Oct 26, 2023  9:21 AM Marda MATSU wrote: Mother calling and would like a call back regarding lab results. Mother is upset that they were given to patient because she is mentally impaired

## 2023-11-17 ENCOUNTER — Ambulatory Visit: Payer: Medicaid Other | Admitting: Nurse Practitioner

## 2023-12-01 ENCOUNTER — Ambulatory Visit: Admitting: Nurse Practitioner

## 2023-12-01 ENCOUNTER — Encounter: Payer: Self-pay | Admitting: Nurse Practitioner

## 2023-12-01 VITALS — BP 108/76 | HR 77 | Ht 61.0 in | Wt 161.4 lb

## 2023-12-01 DIAGNOSIS — Z7985 Long-term (current) use of injectable non-insulin antidiabetic drugs: Secondary | ICD-10-CM | POA: Diagnosis not present

## 2023-12-01 DIAGNOSIS — E039 Hypothyroidism, unspecified: Secondary | ICD-10-CM

## 2023-12-01 DIAGNOSIS — E1122 Type 2 diabetes mellitus with diabetic chronic kidney disease: Secondary | ICD-10-CM | POA: Diagnosis not present

## 2023-12-01 DIAGNOSIS — Z7984 Long term (current) use of oral hypoglycemic drugs: Secondary | ICD-10-CM

## 2023-12-01 DIAGNOSIS — N1832 Chronic kidney disease, stage 3b: Secondary | ICD-10-CM

## 2023-12-01 DIAGNOSIS — I10 Essential (primary) hypertension: Secondary | ICD-10-CM

## 2023-12-01 DIAGNOSIS — E559 Vitamin D deficiency, unspecified: Secondary | ICD-10-CM | POA: Diagnosis not present

## 2023-12-01 DIAGNOSIS — E782 Mixed hyperlipidemia: Secondary | ICD-10-CM

## 2023-12-01 NOTE — Patient Instructions (Signed)

## 2023-12-01 NOTE — Progress Notes (Signed)
 12/01/2023, 8:33 AM        Endocrinology follow-up note    Subjective:    Patient ID: Diane Morgan, female    DOB: [birth year].  Diane Morgan is being iseen in follow-up for management of currently controlled symptomatic type 2 diabetes, hypothyroidism, hyperlipidemia, hypertension. PMD:  Dettinger, Fonda LABOR, MD.    Past Medical History:  Diagnosis Date   Anemia    Arthritis    Cataract    right   Cellulitis and abscess of trunk 11/28/2012   Diabetic peripheral neuropathy (HCC)    DM (diabetes mellitus) (HCC)    Eczema    GERD (gastroesophageal reflux disease)    Hyperlipidemia    Hypertension    Hypothyroidism    Leg pain, bilateral 10/08/09   NIDDM (non-insulin  dependent diabetes mellitus)    URI (upper respiratory infection) 07/01/10   Past Surgical History:  Procedure Laterality Date   CATARACT EXTRACTION W/PHACO Right 06/12/2017   Procedure: CATARACT EXTRACTION WITH PHACOEMULSIFICATION  AND INTRAOCULAR LENS PLACEMENT RIGHT EYE;  Surgeon: Perley Hamilton, MD;  Location: [facility];  Service: Ophthalmology;  Laterality: Right;  CDE: 1.16   CHOLECYSTECTOMY     EYE SURGERY     right catracts   EYE SURGERY  12/2019   left cataract   LASER ABLATION CONDOLAMATA N/A 03/08/2017   Procedure: LASER ABLATION OF THE CERVIX;  Surgeon: Jayne Vonn DEL, MD;  Location: [facility];  Service: Gynecology;  Laterality: N/A;   WISDOM TOOTH EXTRACTION     Social History   Socioeconomic History   Marital status: Single    Spouse name: Not on file   Number of children: Not on file   Years of education: Not on file   Highest education level: Not on file  Occupational History   Not on file  Tobacco Use   Smoking status: Every Day    Types: Cigarettes   Smokeless tobacco: Never   Tobacco comments:    smokes 4 cig daily  Vaping Use   Vaping status: Never Used  Substance and Sexual Activity   Alcohol use: No   Drug use: No   Sexual activity: Not Currently     Birth control/protection: None  Other Topics Concern   Not on file  Social History Narrative   Not on file   Social Drivers of Health   Financial Resource Strain: Not on file  Food Insecurity: Not on file  Transportation Needs: Not on file  Physical Activity: Not on file  Stress: Not on file  Social Connections: Unknown (08/02/2021)   [facility] from [facility]   Social Network    Social Network: Not on file   Outpatient Encounter Medications as of 12/01/2023  Medication Sig   aspirin  81 MG chewable tablet Chew 81 mg by mouth daily.   atorvastatin  (LIPITOR) 40 MG tablet Take 1 tablet by mouth once daily   Blood Glucose Monitoring Suppl (ACCU-CHEK AVIVA PLUS) w/Device KIT USE AS DIRECTED   cetirizine (ZYRTEC) 10 MG tablet Take 10 mg by mouth at bedtime.    cycloSPORINE (RESTASIS) 0.05 % ophthalmic emulsion 1 drop 2 (two) times daily.   DM-Doxylamine-Acetaminophen  (NYQUIL COLD & FLU PO) Take by mouth.   Dulaglutide  (TRULICITY ) 4.5 MG/0.5ML SOAJ Inject 4.5 mg into the skin once a week.   fenofibrate  (TRICOR ) 145 MG tablet Take 1 tablet by mouth once daily   gabapentin  (NEURONTIN ) 300 MG capsule Take 1 capsule (300 mg total) by  mouth at bedtime.   glucose blood (ACCU-CHEK GUIDE TEST) test strip Use to check blood glucose daily as directed.   ibuprofen  (ADVIL ,MOTRIN ) 200 MG tablet Take 400 mg by mouth every 6 (six) hours as needed for headache or moderate pain.   levothyroxine  (SYNTHROID ) 75 MCG tablet Take 1 tablet (75 mcg total) by mouth daily before breakfast.   Pseudoephedrine-APAP-DM (DAYQUIL MULTI-SYMPTOM COLD/FLU PO) Take by mouth.   Vitamin D , Ergocalciferol , (DRISDOL ) 1.25 MG (50000 UNIT) CAPS capsule Take 1 capsule by mouth once a week   No facility-administered encounter medications on file as of 12/01/2023.    ALLERGIES: Allergies  Allergen Reactions   Penicillins Anaphylaxis, Hives and Other (See Comments)    Childhood Reaction. Has patient had a PCN reaction causing  immediate rash, facial/tongue/throat swelling, SOB or lightheadedness with hypotension: Yes Has patient had a PCN reaction causing severe rash involving mucus membranes or skin necrosis: No Has patient had a PCN reaction that required hospitalization: No Has patient had a PCN reaction occurring within the last 10 years: No If all of the above answers are NO, then may proceed with Cephalosporin use.    Niaspan [Niacin] Other (See Comments)    Patient passes out.    VACCINATION STATUS: Immunization History  Administered Date(s) Administered   Influenza, Seasonal, Injecte, Preservative Fre 04/17/2023   Influenza,inj,Quad PF,6+ Mos 11/28/2012, 01/22/2014, 12/26/2014, 12/07/2015, 12/15/2016, 01/02/2018, 01/07/2019, 01/23/2020, 01/29/2021, 02/02/2022   PFIZER(Purple Top)SARS-COV-2 Vaccination 06/29/2019, 07/20/2019, 01/23/2020   PNEUMOCOCCAL CONJUGATE-20 02/02/2022   Pneumococcal Polysaccharide-23 01/07/2019   Tdap 03/19/2013, 04/17/2023   Diabetes She presents for her follow-up diabetic visit. She has type 2 diabetes mellitus. Onset time: Diagnosed at approx age of 80. Her disease course has been stable. There are no hypoglycemic associated symptoms. Pertinent negatives for hypoglycemia include no nervousness/anxiousness or tremors. There are no diabetic associated symptoms. Pertinent negatives for diabetes include no fatigue and no weight loss. There are no hypoglycemic complications. Symptoms are stable. Diabetic complications include nephropathy. Risk factors for coronary artery disease include diabetes mellitus, dyslipidemia, hypertension, obesity, sedentary lifestyle and tobacco exposure. Current diabetic treatments: Trulicity  4.5 SQ weekly. She is compliant with treatment all of the time. Her weight is fluctuating minimally. She is following a generally healthy diet. When asked about meal planning, she reported none. She has not had a previous visit with a dietitian. She rarely participates  in exercise. Her home blood glucose trend is decreasing steadily. Her breakfast blood glucose range is generally 90-110 mg/dl. Her overall blood glucose range is 90-110 mg/dl. (She presents today, accompanied by her mother, with her meter and logs showing at goal glycemic profile overall.  Her previsit A1c on 7/28 was 5.6%, improving from last visit of 7.1%.  She has done well with lifestyle management and Trulicity .) An ACE inhibitor/angiotensin II receptor blocker is not being taken. She does not see a podiatrist.Eye exam is current.  Thyroid  Problem Presents for follow-up visit. Symptoms include weight gain. Patient reports no anxiety, cold intolerance, constipation, depressed mood, diarrhea, fatigue, heat intolerance, leg swelling, palpitations, tremors or weight loss. The symptoms have been stable. Her past medical history is significant for diabetes.  Hyperlipidemia This is a chronic problem. The current episode started more than 1 year ago. The problem is uncontrolled. Recent lipid tests were reviewed and are variable. Exacerbating diseases include chronic renal disease, diabetes, hypothyroidism and obesity. Factors aggravating her hyperlipidemia include fatty foods. Current antihyperlipidemic treatment includes statins and fibric acid derivatives. The current treatment provides moderate improvement of  lipids. There are no compliance problems.  Risk factors for coronary artery disease include diabetes mellitus, dyslipidemia, hypertension, obesity and a sedentary lifestyle.   Review of systems  Constitutional: + decreasing body weight,  current Body mass index is 30.5 kg/m. , no fatigue, no subjective hyperthermia, no subjective hypothermia Eyes: no blurry vision, no xerophthalmia ENT: no sore throat, no nodules palpated in throat, no dysphagia/odynophagia, no hoarseness Cardiovascular: no chest pain, no shortness of breath, no palpitations, no leg swelling Respiratory: no cough, no shortness of  breath Gastrointestinal: no nausea/vomiting/diarrhea Musculoskeletal: no muscle/joint aches Skin: no rashes, no hyperemia Neurological: no tremors, no numbness, no tingling, no dizziness Psychiatric: no depression, no anxiety   Objective:    BP 108/76 (BP Location: Left Arm, Patient Position: Sitting, Cuff Size: Large)   Pulse 77   Ht 5' 1 (1.549 m)   Wt 161 lb 6.4 oz (73.2 kg)   BMI 30.50 kg/m   Wt Readings from Last 3 Encounters:  12/01/23 161 lb 6.4 oz (73.2 kg)  10/16/23 165 lb (74.8 kg)  05/19/23 166 lb 3.2 oz (75.4 kg)    BP Readings from Last 3 Encounters:  12/01/23 108/76  10/16/23 122/74  05/19/23 102/60     Physical Exam- Limited  Constitutional:  Body mass index is 30.5 kg/m. , not in acute distress, normal state of mind Eyes:  EOMI, no exophthalmos Musculoskeletal: no gross deformities, strength intact in all four extremities, no gross restriction of joint movements Skin:  no rashes, no hyperemia Neurological: no tremor with outstretched hands   Diabetic Foot Exam - Simple   No data filed      CMP     Component Value Date/Time   NA 134 10/16/2023 0939   K 5.1 10/16/2023 0939   CL 99 10/16/2023 0939   CO2 19 (L) 10/16/2023 0939   GLUCOSE 97 10/16/2023 0939   GLUCOSE 438 (H) 06/06/2017 0811   BUN 18 10/16/2023 0939   CREATININE 1.74 (H) 10/16/2023 0939   CREATININE 1.13 (H) 08/17/2012 1031   CALCIUM  9.5 10/16/2023 0939   PROT 5.6 (L) 10/16/2023 0939   ALBUMIN 3.6 (L) 10/16/2023 0939   AST 20 10/16/2023 0939   ALT 13 10/16/2023 0939   ALKPHOS 66 10/16/2023 0939   BILITOT 0.5 10/16/2023 0939   GFRNONAA 50 (L) 01/23/2020 1126   GFRNONAA 61 08/17/2012 1031   GFRAA 58 (L) 01/23/2020 1126   GFRAA 70 08/17/2012 1031     Diabetic Labs (most recent): Lab Results  Component Value Date   HGBA1C 5.6 10/16/2023   HGBA1C 6.1 (H) 04/17/2023   HGBA1C 6.4 (A) 01/13/2023   MICROALBUR 30 mg/L 12/03/2021   MICROALBUR 150 10/09/2020   MICROALBUR 20  11/14/2014     Lipid Panel ( most recent) Lipid Panel     Component Value Date/Time   CHOL 92 (L) 10/16/2023 0936   CHOL 85 08/17/2012 1031   TRIG 144 10/16/2023 0936   TRIG 161 (H) 08/17/2012 1031   HDL 26 (L) 10/16/2023 0936   HDL 26 (L) 08/17/2012 1031   CHOLHDL 3.5 10/16/2023 0936   CHOLHDL 10.3 11/29/2012 1030   VLDL 39 11/29/2012 1030   LDLCALC 41 10/16/2023 0936   LDLCALC 27 08/17/2012 1031     Lab Results  Component Value Date   TSH 0.948 10/16/2023   TSH 0.910 10/16/2023   TSH 0.759 04/17/2023   TSH 0.750 04/17/2023   TSH 1.600 01/02/2023   TSH 0.682 09/02/2022   TSH 0.871 02/02/2022  TSH 1.160 07/30/2021   TSH 1.010 01/04/2021   TSH 0.671 06/29/2020   FREET4 1.72 10/16/2023   FREET4 1.62 04/17/2023   FREET4 1.56 01/02/2023   FREET4 2.11 (H) 09/02/2022   FREET4 1.52 02/02/2022   FREET4 1.77 07/30/2021   FREET4 1.57 01/04/2021   FREET4 2.22 (H) 06/29/2020   FREET4 1.38 01/23/2020   FREET4 1.91 (H) 04/22/2019     Latest Reference Range & Units 01/04/21 09:15 07/30/21 09:27 07/30/21 09:36 02/02/22 11:07 09/02/22 08:37 01/02/23 08:34  TSH 0.450 - 4.500 uIU/mL 1.010 1.160  0.871 0.682 1.600  T4,Free(Direct) 0.82 - 1.77 ng/dL 8.42  8.22 8.47 7.88 (H) 1.56  Thyroxine (T4) 4.5 - 12.0 ug/dL  9.7      Free Thyroxine Index 1.2 - 4.9   3.2      T3 Uptake Ratio 24 - 39 %  33      (H): Data is abnormally high  Assessment & Plan:   1) Type 2 diabetes mellitus with stage 3 chronic kidney disease, without long-term current use of insulin  (HCC)  - Diane Morgan has currently controlled type 2 DM since 53 years of age.  She presents today, accompanied by her mother, with her meter and logs showing at goal glycemic profile overall.  Her previsit A1c on 7/28 was 5.6%, improving from last visit of 7.1%.  She has done well with lifestyle management and Trulicity .  -her diabetes is complicated by obesity/sedentary life, CKD, smoking, and Diane Morgan remains at a  high risk for more acute and chronic complications which include CAD, CVA, CKD, retinopathy, and neuropathy. These are all discussed in detail with the patient.  - Nutritional counseling repeated at each appointment due to patients tendency to fall back in to old habits.  - The patient admits there is a room for improvement in their diet and drink choices. -  Suggestion is made for the patient to avoid simple carbohydrates from their diet including Cakes, Sweet Desserts / Pastries, Ice Cream, Soda (diet and regular), Sweet Tea, Candies, Chips, Cookies, Sweet Pastries, Store Bought Juices, Alcohol in Excess of 1-2 drinks a day, Artificial Sweeteners, Coffee Creamer, and Sugar-free Products. This will help patient to have stable blood glucose profile and potentially avoid unintended weight gain.   - I encouraged the patient to switch to unprocessed or minimally processed complex starch and increased protein intake (animal or plant source), fruits, and vegetables.   - Patient is advised to stick to a routine mealtimes to eat 3 meals a day and avoid unnecessary snacks (to snack only to correct hypoglycemia).  - I have approached her with the following individualized plan to manage diabetes and patient agrees:   -Based on her stable glycemic profile, no changes were made to her medications today.  She is advised to continue Trulicity  4.5 mg SQ weekly.  -She is advised to continue monitoring blood glucose at least once daily 2-3 times per week during the holiday season to help keep her on track, before breakfast and to call the clinic if she has readings less than 70 or above 200 for 3 tests in a row.  - Patient specific target  A1c;  LDL, HDL, Triglycerides  were discussed in detail.  2) BP/HTN:  -Her blood pressure is controlled to target without the use of antihypertensive medications.  3) Lipids/HPL:    Her most recent lipid panel from 10/16/23 shows controlled LDL of 41.  She is advised to  continue Atorvastatin  40 mg po  daily at bedtime.  Side effects and precautions discussed with her.  She is advised to avoid fried foods and butter.  4)  Weight/Diet:  Her Body mass index is 30.5 kg/m.-she is a candidate for modest weight loss.  CDE Consult has been  initiated , exercise, and detailed carbohydrates information provided.38  5) Hypothyroidism: -Diagnosed at approximate age of 42 years.  Her previsit thyroid  function tests are consistent with appropriate hormone replacement.  She is advised to continue Levothyroxine  75 mcg po daily before breakfast but skip 1 day out of the week.   Will recheck prior to next visit and adjust dose accordingly.    - We discussed about the correct intake of her thyroid  hormone, on empty stomach at fasting, with water, separated by at least 30 minutes from breakfast and other medications,  and separated by more than 4 hours from calcium , iron, multivitamins, acid reflux medications (PPIs). -Patient is made aware of the fact that thyroid  hormone replacement is needed for life, dose to be adjusted by periodic monitoring of thyroid  function tests.  6) Chronic Care/Health Maintenance: -she is on statin medications and is encouraged to continue to follow up with Ophthalmology, Dentist, Podiatrist at least yearly or according to recommendations, and advised to away from smoking.    I have recommended yearly flu vaccine and pneumonia vaccination at least every 5 years; moderate intensity exercise for up to 150 minutes weekly; and  sleep for at least 7 hours a day.  7) Vitamin D  deficiency Her most recent vitamin  D level was 23.7 on 09/02/22.  I refilled her Ergocalciferol  50000 units weekly.  She can also take the OTC Vitamin D3 5000 units daily to help boost her numbers.    - I advised patient to maintain close follow up with Dettinger, Fonda LABOR, MD for primary care needs.     I spent  20  minutes in the care of the patient today including review of labs  from CMP, Lipids, Thyroid  Function, Hematology (current and previous including abstractions from other facilities); face-to-face time discussing  her blood glucose readings/logs, discussing hypoglycemia and hyperglycemia episodes and symptoms, medications doses, her options of short and long term treatment based on the latest standards of care / guidelines;  discussion about incorporating lifestyle medicine;  and documenting the encounter. Risk reduction counseling performed per USPSTF guidelines to reduce obesity and cardiovascular risk factors.     Please refer to Patient Instructions for Blood Glucose Monitoring and Insulin /Medications Dosing Guide  in media tab for additional information. Please  also refer to  Patient Self Inventory in the Media  tab for reviewed elements of pertinent patient history.  Shona CROME Polo participated in the discussions, expressed understanding, and voiced agreement with the above plans.  All questions were answered to her satisfaction. she is encouraged to contact clinic should she have any questions or concerns prior to her return visit.   Follow up plan: - Return in about 6 months (around 05/30/2024) for Diabetes F/U with A1c in office, Thyroid  follow up, Previsit labs.  Benton Rio, [facility] [address] [place], [place] [address] Phone: [phone] Fax: [phone]  12/01/2023, 8:33 AM

## 2023-12-02 ENCOUNTER — Other Ambulatory Visit: Payer: Self-pay | Admitting: Family Medicine

## 2023-12-02 ENCOUNTER — Other Ambulatory Visit: Payer: Self-pay | Admitting: Nurse Practitioner

## 2023-12-02 DIAGNOSIS — E1142 Type 2 diabetes mellitus with diabetic polyneuropathy: Secondary | ICD-10-CM

## 2023-12-02 DIAGNOSIS — E785 Hyperlipidemia, unspecified: Secondary | ICD-10-CM

## 2023-12-02 DIAGNOSIS — I10 Essential (primary) hypertension: Secondary | ICD-10-CM

## 2024-01-27 DIAGNOSIS — K047 Periapical abscess without sinus: Secondary | ICD-10-CM | POA: Diagnosis not present

## 2024-04-06 ENCOUNTER — Other Ambulatory Visit: Payer: Self-pay | Admitting: Nurse Practitioner

## 2024-04-19 ENCOUNTER — Ambulatory Visit: Payer: Self-pay | Admitting: Family Medicine

## 2024-04-19 ENCOUNTER — Ambulatory Visit: Admitting: Family Medicine

## 2024-04-24 ENCOUNTER — Ambulatory Visit: Admitting: Family Medicine

## 2024-04-26 ENCOUNTER — Ambulatory Visit: Admitting: Family Medicine

## 2024-05-17 ENCOUNTER — Other Ambulatory Visit

## 2024-05-31 ENCOUNTER — Ambulatory Visit: Admitting: Nurse Practitioner

## 2024-06-03 ENCOUNTER — Ambulatory Visit: Admitting: Family Medicine
